# Patient Record
Sex: Male | Born: 1965 | Race: White | Hispanic: No | Marital: Single | Smoking: Never smoker
Health system: Southern US, Community
[De-identification: ages and names within clinical notes are randomized; demographics above are authoritative.]

## PROBLEM LIST (undated history)

## (undated) DIAGNOSIS — J309 Allergic rhinitis, unspecified: Secondary | ICD-10-CM

## (undated) DIAGNOSIS — E119 Type 2 diabetes mellitus without complications: Secondary | ICD-10-CM

## (undated) DIAGNOSIS — I10 Essential (primary) hypertension: Secondary | ICD-10-CM

## (undated) DIAGNOSIS — E663 Overweight: Secondary | ICD-10-CM

## (undated) DIAGNOSIS — E785 Hyperlipidemia, unspecified: Secondary | ICD-10-CM

## (undated) DIAGNOSIS — M5137 Other intervertebral disc degeneration, lumbosacral region: Secondary | ICD-10-CM

## (undated) DIAGNOSIS — R209 Unspecified disturbances of skin sensation: Secondary | ICD-10-CM

## (undated) DIAGNOSIS — G0490 Encephalitis and encephalomyelitis, unspecified: Secondary | ICD-10-CM

## (undated) DIAGNOSIS — G0491 Myelitis, unspecified: Secondary | ICD-10-CM

## (undated) DIAGNOSIS — N410 Acute prostatitis: Secondary | ICD-10-CM

## (undated) DIAGNOSIS — J111 Influenza due to unidentified influenza virus with other respiratory manifestations: Secondary | ICD-10-CM

## (undated) DIAGNOSIS — H539 Unspecified visual disturbance: Secondary | ICD-10-CM

## (undated) DIAGNOSIS — H4710 Unspecified papilledema: Secondary | ICD-10-CM

## (undated) HISTORY — PX: DENTAL SURGERY: SHX609

## (undated) HISTORY — DX: Hyperlipidemia, unspecified: E78.5

## (undated) HISTORY — DX: Overweight: E66.3

## (undated) HISTORY — DX: Unspecified disturbances of skin sensation: R20.9

## (undated) HISTORY — DX: Essential (primary) hypertension: I10

## (undated) HISTORY — DX: Myelitis, unspecified: G04.91

## (undated) HISTORY — DX: Acute prostatitis: N41.0

## (undated) HISTORY — DX: Type 2 diabetes mellitus without complications: E11.9

## (undated) HISTORY — DX: Encephalitis and encephalomyelitis, unspecified: G04.90

## (undated) HISTORY — DX: Other intervertebral disc degeneration, lumbosacral region: M51.37

## (undated) HISTORY — PX: POLYPECTOMY: SHX149

## (undated) HISTORY — PX: COLONOSCOPY: SHX174

## (undated) HISTORY — DX: Allergic rhinitis, unspecified: J30.9

---

## 2001-09-10 ENCOUNTER — Encounter: Admission: RE | Admit: 2001-09-10 | Discharge: 2001-12-09 | Payer: Self-pay | Admitting: Family Medicine

## 2006-07-07 ENCOUNTER — Ambulatory Visit: Payer: Self-pay | Admitting: Family Medicine

## 2006-07-17 ENCOUNTER — Ambulatory Visit: Payer: Self-pay | Admitting: Family Medicine

## 2006-10-17 ENCOUNTER — Ambulatory Visit: Payer: Self-pay | Admitting: Family Medicine

## 2006-10-17 LAB — CONVERTED CEMR LAB
ALT: 50 units/L — ABNORMAL HIGH (ref 0–40)
Chol/HDL Ratio, serum: 6.2
Hgb A1c MFr Bld: 9.7 % — ABNORMAL HIGH (ref 4.6–6.0)
LDL Cholesterol: 109 mg/dL — ABNORMAL HIGH (ref 0–99)
VLDL: 40 mg/dL (ref 0–40)

## 2006-10-24 ENCOUNTER — Ambulatory Visit: Payer: Self-pay | Admitting: Family Medicine

## 2006-12-13 ENCOUNTER — Ambulatory Visit: Payer: Self-pay | Admitting: Family Medicine

## 2006-12-25 ENCOUNTER — Ambulatory Visit: Payer: Self-pay | Admitting: Family Medicine

## 2006-12-25 LAB — CONVERTED CEMR LAB
ALT: 38 units/L (ref 0–40)
AST: 32 units/L (ref 0–37)
HDL: 29 mg/dL — ABNORMAL LOW (ref >39.0)
Hgb A1c MFr Bld: 6.6 % — ABNORMAL HIGH (ref 4.6–6.0)
LDL Cholesterol: 89 mg/dL (ref 0–99)
VLDL: 21 mg/dL (ref 0–40)

## 2007-01-15 ENCOUNTER — Ambulatory Visit: Payer: Self-pay | Admitting: Family Medicine

## 2007-01-24 ENCOUNTER — Ambulatory Visit: Payer: Self-pay | Admitting: Family Medicine

## 2007-01-24 LAB — CONVERTED CEMR LAB
ALT: 45 units/L — ABNORMAL HIGH (ref 0–40)
AST: 35 units/L (ref 0–37)
Cholesterol: 138 mg/dL (ref 0–200)
Hgb A1c MFr Bld: 6.3 % — ABNORMAL HIGH (ref 4.6–6.0)

## 2007-02-14 ENCOUNTER — Ambulatory Visit: Payer: Self-pay | Admitting: Family Medicine

## 2007-02-14 LAB — CONVERTED CEMR LAB
Sex Hormone Binding: 40 nmol/L (ref 13–71)
Testosterone Free: 118.9 pg/mL (ref 47.0–244.0)
Testosterone: 615.76 ng/dL (ref 350–890)

## 2007-02-26 DIAGNOSIS — E119 Type 2 diabetes mellitus without complications: Secondary | ICD-10-CM

## 2007-02-26 DIAGNOSIS — E11319 Type 2 diabetes mellitus with unspecified diabetic retinopathy without macular edema: Secondary | ICD-10-CM | POA: Insufficient documentation

## 2007-02-26 DIAGNOSIS — E785 Hyperlipidemia, unspecified: Secondary | ICD-10-CM

## 2007-02-26 DIAGNOSIS — I10 Essential (primary) hypertension: Secondary | ICD-10-CM | POA: Insufficient documentation

## 2007-02-26 DIAGNOSIS — E663 Overweight: Secondary | ICD-10-CM

## 2007-02-26 HISTORY — DX: Essential (primary) hypertension: I10

## 2007-02-26 HISTORY — DX: Overweight: E66.3

## 2007-02-26 HISTORY — DX: Type 2 diabetes mellitus without complications: E11.9

## 2007-02-26 HISTORY — DX: Hyperlipidemia, unspecified: E78.5

## 2007-05-17 ENCOUNTER — Ambulatory Visit: Payer: Self-pay | Admitting: Family Medicine

## 2007-05-17 LAB — CONVERTED CEMR LAB
ALT: 33 units/L (ref 0–53)
BUN: 14 mg/dL (ref 6–23)
Chloride: 105 meq/L (ref 96–112)
Creatinine, Ser: 0.8 mg/dL (ref 0.4–1.5)
Creatinine,U: 367.7 mg/dL
GFR calc non Af Amer: 114 mL/min
Hgb A1c MFr Bld: 5.8 % (ref 4.6–6.0)
LDL Cholesterol: 95 mg/dL (ref 0–99)
Microalb, Ur: 2.2 mg/dL — ABNORMAL HIGH (ref 0.0–1.9)
Potassium: 4.1 meq/L (ref 3.5–5.1)
Sodium: 141 meq/L (ref 135–145)
VLDL: 33 mg/dL (ref 0–40)

## 2007-05-18 ENCOUNTER — Encounter (INDEPENDENT_AMBULATORY_CARE_PROVIDER_SITE_OTHER): Payer: Self-pay | Admitting: *Deleted

## 2007-05-24 ENCOUNTER — Ambulatory Visit: Payer: Self-pay | Admitting: Family Medicine

## 2007-11-28 ENCOUNTER — Encounter (INDEPENDENT_AMBULATORY_CARE_PROVIDER_SITE_OTHER): Payer: Self-pay | Admitting: Family Medicine

## 2007-11-30 ENCOUNTER — Ambulatory Visit: Payer: Self-pay | Admitting: Family Medicine

## 2007-12-04 LAB — CONVERTED CEMR LAB
BUN: 11 mg/dL (ref 6–23)
CO2: 32 meq/L (ref 19–32)
Calcium: 9.1 mg/dL (ref 8.4–10.5)
Creatinine,U: 202.1 mg/dL
GFR calc Af Amer: 120 mL/min
HDL: 26.7 mg/dL — ABNORMAL LOW (ref >39.0)
Microalb Creat Ratio: 13.9 mg/g (ref 0.0–30.0)
Microalb, Ur: 2.8 mg/dL — ABNORMAL HIGH (ref 0.0–1.9)
Potassium: 4 meq/L (ref 3.5–5.1)
Triglycerides: 186 mg/dL — ABNORMAL HIGH (ref 0–149)
VLDL: 37 mg/dL (ref 0–40)

## 2007-12-10 ENCOUNTER — Ambulatory Visit: Payer: Self-pay | Admitting: Family Medicine

## 2007-12-11 ENCOUNTER — Encounter (INDEPENDENT_AMBULATORY_CARE_PROVIDER_SITE_OTHER): Payer: Self-pay | Admitting: *Deleted

## 2007-12-11 LAB — CONVERTED CEMR LAB: AST: 41 units/L — ABNORMAL HIGH (ref 0–37)

## 2009-02-05 ENCOUNTER — Ambulatory Visit: Payer: Self-pay | Admitting: Family Medicine

## 2009-02-05 LAB — CONVERTED CEMR LAB
AST: 37 units/L (ref 0–37)
BUN: 15 mg/dL (ref 6–23)
Basophils Absolute: 0 10*3/uL (ref 0.0–0.1)
Bilirubin Urine: NEGATIVE
Bilirubin, Direct: 0.1 mg/dL (ref 0.0–0.3)
Blood in Urine, dipstick: NEGATIVE
Calcium: 9.2 mg/dL (ref 8.4–10.5)
Cholesterol: 219 mg/dL — ABNORMAL HIGH (ref 0–200)
Creatinine, Ser: 0.8 mg/dL (ref 0.4–1.5)
Creatinine,U: 202.3 mg/dL
GFR calc non Af Amer: 113.03 mL/min (ref >60)
Glucose, Bld: 136 mg/dL — ABNORMAL HIGH (ref 70–99)
Glucose, Urine, Semiquant: NEGATIVE
HCT: 43.5 % (ref 39.0–52.0)
HDL: 26.4 mg/dL — ABNORMAL LOW (ref >39.00)
Hgb A1c MFr Bld: 7.6 % — ABNORMAL HIGH (ref 4.6–6.5)
Lymphs Abs: 2.1 10*3/uL (ref 0.7–4.0)
Microalb Creat Ratio: 6.9 mg/g (ref 0.0–30.0)
Monocytes Absolute: 0.6 10*3/uL (ref 0.1–1.0)
Monocytes Relative: 7.6 % (ref 3.0–12.0)
Neutrophils Relative %: 62.7 % (ref 43.0–77.0)
Platelets: 176 10*3/uL (ref 150.0–400.0)
Potassium: 3.7 meq/L (ref 3.5–5.1)
RDW: 12.5 % (ref 11.5–14.6)
Specific Gravity, Urine: 1.025
TSH: 2.84 microintl units/mL (ref 0.35–5.50)
Total Bilirubin: 1 mg/dL (ref 0.3–1.2)
Triglycerides: 219 mg/dL — ABNORMAL HIGH (ref 0.0–149.0)
VLDL: 43.8 mg/dL — ABNORMAL HIGH (ref 0.0–40.0)
pH: 6

## 2009-03-12 ENCOUNTER — Ambulatory Visit: Payer: Self-pay | Admitting: Family Medicine

## 2009-03-12 DIAGNOSIS — J309 Allergic rhinitis, unspecified: Secondary | ICD-10-CM

## 2009-03-12 DIAGNOSIS — R040 Epistaxis: Secondary | ICD-10-CM | POA: Insufficient documentation

## 2009-03-12 HISTORY — DX: Allergic rhinitis, unspecified: J30.9

## 2009-03-30 ENCOUNTER — Ambulatory Visit: Payer: Self-pay | Admitting: Family Medicine

## 2009-04-15 ENCOUNTER — Ambulatory Visit: Payer: Self-pay | Admitting: Family Medicine

## 2009-04-15 DIAGNOSIS — K5900 Constipation, unspecified: Secondary | ICD-10-CM | POA: Insufficient documentation

## 2009-04-17 ENCOUNTER — Ambulatory Visit: Payer: Self-pay | Admitting: Family Medicine

## 2009-04-17 ENCOUNTER — Telehealth: Payer: Self-pay | Admitting: *Deleted

## 2009-04-17 DIAGNOSIS — R209 Unspecified disturbances of skin sensation: Secondary | ICD-10-CM

## 2009-04-17 DIAGNOSIS — N410 Acute prostatitis: Secondary | ICD-10-CM | POA: Insufficient documentation

## 2009-04-17 HISTORY — DX: Acute prostatitis: N41.0

## 2009-04-17 HISTORY — DX: Unspecified disturbances of skin sensation: R20.9

## 2009-04-19 ENCOUNTER — Emergency Department (HOSPITAL_COMMUNITY): Admission: EM | Admit: 2009-04-19 | Discharge: 2009-04-19 | Payer: Self-pay | Admitting: Emergency Medicine

## 2009-04-20 ENCOUNTER — Ambulatory Visit: Payer: Self-pay | Admitting: Internal Medicine

## 2009-04-20 ENCOUNTER — Inpatient Hospital Stay (HOSPITAL_COMMUNITY): Admission: EM | Admit: 2009-04-20 | Discharge: 2009-05-01 | Payer: Self-pay | Admitting: Emergency Medicine

## 2009-04-27 ENCOUNTER — Ambulatory Visit: Payer: Self-pay | Admitting: Physical Medicine & Rehabilitation

## 2009-05-01 ENCOUNTER — Ambulatory Visit: Payer: Self-pay | Admitting: Family Medicine

## 2009-05-01 DIAGNOSIS — G0490 Encephalitis and encephalomyelitis, unspecified: Secondary | ICD-10-CM | POA: Insufficient documentation

## 2009-05-01 DIAGNOSIS — G0491 Myelitis, unspecified: Secondary | ICD-10-CM

## 2009-05-01 HISTORY — DX: Encephalitis and encephalomyelitis, unspecified: G04.90

## 2009-05-05 ENCOUNTER — Ambulatory Visit: Payer: Self-pay | Admitting: Family Medicine

## 2009-05-07 ENCOUNTER — Telehealth: Payer: Self-pay | Admitting: *Deleted

## 2009-05-07 ENCOUNTER — Ambulatory Visit: Payer: Self-pay | Admitting: Family Medicine

## 2009-05-13 ENCOUNTER — Ambulatory Visit: Payer: Self-pay | Admitting: Family Medicine

## 2009-05-19 ENCOUNTER — Telehealth: Payer: Self-pay | Admitting: *Deleted

## 2009-05-27 ENCOUNTER — Telehealth: Payer: Self-pay | Admitting: *Deleted

## 2009-06-02 ENCOUNTER — Ambulatory Visit: Payer: Self-pay | Admitting: Family Medicine

## 2009-06-02 DIAGNOSIS — R300 Dysuria: Secondary | ICD-10-CM | POA: Insufficient documentation

## 2009-06-02 LAB — CONVERTED CEMR LAB
Nitrite: NEGATIVE
Specific Gravity, Urine: 1.025
pH: 5

## 2009-06-16 ENCOUNTER — Ambulatory Visit: Payer: Self-pay | Admitting: Family Medicine

## 2009-06-16 DIAGNOSIS — M51379 Other intervertebral disc degeneration, lumbosacral region without mention of lumbar back pain or lower extremity pain: Secondary | ICD-10-CM

## 2009-06-16 DIAGNOSIS — M5137 Other intervertebral disc degeneration, lumbosacral region: Secondary | ICD-10-CM | POA: Insufficient documentation

## 2009-06-16 HISTORY — DX: Other intervertebral disc degeneration, lumbosacral region: M51.37

## 2009-06-16 HISTORY — DX: Other intervertebral disc degeneration, lumbosacral region without mention of lumbar back pain or lower extremity pain: M51.379

## 2009-06-17 ENCOUNTER — Encounter: Payer: Self-pay | Admitting: Family Medicine

## 2009-06-18 ENCOUNTER — Ambulatory Visit: Payer: Self-pay | Admitting: Family Medicine

## 2009-06-22 ENCOUNTER — Telehealth: Payer: Self-pay | Admitting: *Deleted

## 2009-07-03 ENCOUNTER — Encounter: Admission: RE | Admit: 2009-07-03 | Discharge: 2009-07-03 | Payer: Self-pay | Admitting: Neurology

## 2009-07-20 ENCOUNTER — Ambulatory Visit: Payer: Self-pay | Admitting: Family Medicine

## 2009-08-03 ENCOUNTER — Ambulatory Visit: Payer: Self-pay | Admitting: Family Medicine

## 2009-09-02 ENCOUNTER — Ambulatory Visit: Payer: Self-pay | Admitting: Family Medicine

## 2009-09-02 LAB — CONVERTED CEMR LAB
BUN: 14 mg/dL (ref 6–23)
CO2: 28 meq/L (ref 19–32)
Calcium: 8.9 mg/dL (ref 8.4–10.5)
Creatinine, Ser: 0.8 mg/dL (ref 0.4–1.5)
GFR calc non Af Amer: 112.71 mL/min (ref >60)
Glucose, Bld: 137 mg/dL — ABNORMAL HIGH (ref 70–99)
HDL: 28.7 mg/dL — ABNORMAL LOW (ref >39.00)
Triglycerides: 196 mg/dL — ABNORMAL HIGH (ref 0.0–149.0)

## 2009-11-30 ENCOUNTER — Ambulatory Visit: Payer: Self-pay | Admitting: Family Medicine

## 2009-11-30 DIAGNOSIS — J019 Acute sinusitis, unspecified: Secondary | ICD-10-CM | POA: Insufficient documentation

## 2010-01-22 ENCOUNTER — Ambulatory Visit: Payer: Self-pay | Admitting: Family Medicine

## 2010-01-22 LAB — CONVERTED CEMR LAB
AST: 46 units/L — ABNORMAL HIGH (ref 0–37)
Albumin: 3.8 g/dL (ref 3.5–5.2)
Alkaline Phosphatase: 54 units/L (ref 39–117)
Basophils Relative: 0 % (ref 0.0–3.0)
Bilirubin Urine: NEGATIVE
Bilirubin, Direct: 0.1 mg/dL (ref 0.0–0.3)
CO2: 31 meq/L (ref 19–32)
Calcium: 8.9 mg/dL (ref 8.4–10.5)
Creatinine,U: 149.2 mg/dL
Direct LDL: 147.8 mg/dL
Eosinophils Relative: 1.6 % (ref 0.0–5.0)
GFR calc non Af Amer: 112.5 mL/min (ref >60)
Glucose, Bld: 176 mg/dL — ABNORMAL HIGH (ref 70–99)
HDL: 34.7 mg/dL — ABNORMAL LOW (ref >39.00)
Hgb A1c MFr Bld: 6.9 % — ABNORMAL HIGH (ref 4.6–6.5)
Ketones, urine, test strip: NEGATIVE
Lymphocytes Relative: 29 % (ref 12.0–46.0)
Microalb Creat Ratio: 6.7 mg/g (ref 0.0–30.0)
Microalb, Ur: 1 mg/dL (ref 0.0–1.9)
Monocytes Relative: 11.5 % (ref 3.0–12.0)
Neutrophils Relative %: 57.9 % (ref 43.0–77.0)
Platelets: 153 10*3/uL (ref 150.0–400.0)
Potassium: 4.4 meq/L (ref 3.5–5.1)
Protein, U semiquant: NEGATIVE
RBC: 4.66 M/uL (ref 4.22–5.81)
Sodium: 138 meq/L (ref 135–145)
TSH: 2.99 microintl units/mL (ref 0.35–5.50)
Total Protein: 7.2 g/dL (ref 6.0–8.3)
Urobilinogen, UA: 0.2
VLDL: 40.6 mg/dL — ABNORMAL HIGH (ref 0.0–40.0)
WBC: 5.1 10*3/uL (ref 4.5–10.5)
pH: 5

## 2010-01-29 ENCOUNTER — Ambulatory Visit: Payer: Self-pay | Admitting: Family Medicine

## 2010-08-02 ENCOUNTER — Telehealth: Payer: Self-pay | Admitting: Family Medicine

## 2010-12-21 NOTE — Letter (Signed)
Summary: Work Dietitian at [employer]  [address]   [place], [place] [address]   Phone: [phone]  Fax: [phone]    Today's Date: August 03, 2009  Name of Patient: Tyler Aguirre  The above named patient had a medical visit today at:  am / pm.  Please take this into consideration when reviewing the time away from work/school.    Special Instructions:  [  ] None  [  ] To be off the remainder of today, returning to the normal work / school schedule tomorrow.  [  ] To be off until the next scheduled appointment on ______________________.  [  ] Other ________________________________________________________________ ________________________________________________________________________   Sincerely yours,   Kern Reap CMA (AAMA)

## 2010-12-21 NOTE — Assessment & Plan Note (Signed)
Summary: chest congestion//ccm   Vital Signs:  Patient profile:   45 year old male Weight:      288 pounds BMI:     37.11 Temp:     98.9 degrees F oral BP sitting:   140 / 98  (left arm) Cuff size:   regular  Vitals Entered By: Kern Reap CMA Duncan Dull) (November 30, 2009 8:52 AM)  Reason for Visit chest congestion  History of Present Illness: Tyler Aguirre is a 45 year old male, nonsmoker, who comes in today for a two week history of a cold now with facial pain and bloody green nasal drainage.  He states it started as a cold two weeks ago.  Over the last 3 to 4, days she's developed facial pain and bloody discharge from his nose.  He has no fever, earache, nausea, vomiting, or diarrhea.  He also has a cough, but he thinks is coming from his sinuses.  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  He is going to start with a personal trainer in the next couple weeks.  His blood sugar is under excellent control.  His fastings run around 100  Allergies: No Known Drug Allergies  Past History:  Past medical, surgical, family and social histories (including risk factors) reviewed for relevance to current acute and chronic problems.  Past Medical History: Reviewed history from 05/01/2009 and no changes required. Diabetes mellitus, type II Hyperlipidemia Hypertension transverse myelitis  6/10  Family History: Reviewed history from 02/05/2009 and no changes required. Father: heart disorder Mother: arthritis Siblings: brother - healthy  Social History: Reviewed history from 02/05/2009 and no changes required. Occupation: Environmental consultant. Single Never Smoked Alcohol use-yes Regular exercise-no  Review of Systems      See HPI  Physical Exam  General:  Well-developed,well-nourished,in no acute distress; alert,appropriate and cooperative throughout examination Head:  Normocephalic and atraumatic without obvious abnormalities. No apparent alopecia or balding. Eyes:  No corneal or  conjunctival inflammation noted. EOMI. Perrla. Funduscopic exam benign, without hemorrhages, exudates or papilledema. Vision grossly normal. Ears:  External ear exam shows no significant lesions or deformities.  Otoscopic examination reveals clear canals, tympanic membranes are intact bilaterally without bulging, retraction, inflammation or discharge. Hearing is grossly normal bilaterally. Nose:  External nasal examination shows no deformity or inflammation. Nasal mucosa are pink and moist without lesions or exudates. Mouth:  Oral mucosa and oropharynx without lesions or exudates.  Teeth in good repair. Neck:  No deformities, masses, or tenderness noted. Chest Wall:  No deformities, masses, tenderness or gynecomastia noted. Lungs:  Normal respiratory effort, chest expands symmetrically. Lungs are clear to auscultation, no crackles or wheezes.  Diabetes Management Exam:    Foot Exam (with socks and/or shoes not present):       Sensory-Pinprick/Light touch:          Left medial foot (L-4): normal          Left dorsal foot (L-5): normal          Left lateral foot (S-1): normal          Right medial foot (L-4): normal          Right dorsal foot (L-5): normal          Right lateral foot (S-1): normal       Sensory-Monofilament:          Left foot: normal          Right foot: normal       Inspection:  Left foot: normal          Right foot: normal       Nails:          Left foot: normal          Right foot: normal    Eye Exam:       Eye Exam done elsewhere          Date: 08/04/2008          Results: normal          Done by: opth   Problems:  Medical Problems Added: 1)  Dx of Sinusitis- Acute-nos  (ICD-461.9)  Impression & Recommendations:  Problem # 1:  SINUSITIS- ACUTE-NOS (ICD-461.9) Assessment New  His updated medication list for this problem includes:    Amoxicillin 500 Mg Caps (Amoxicillin) .Marland Kitchen... Take 1 tablet by mouth three times a day  Orders: Prescription  Created Electronically [id number])  Complete Medication List: 1)  Glipizide 10 Mg Tb24 (Glipizide) .... Take 1 tablet by mouth two times a day 2)  [employer] [employer] Super Thin 30g Misc (Lancets) .... Uad 3)  Janumet 50-1000 Mg Tabs (Sitagliptin-metformin hcl) .... Take 1 tablet by mouth twice a day 4)  [facility] Blood Glucose Test Strp (Glucose blood) .... Check bid 5)  Lisinopril 40 Mg Tabs (Lisinopril) .... Take half  tablet by mouth every morning 6)  Zocor 20 Mg Tabs (Simvastatin) .Marland Kitchen.. 1 tab @ bedtime 7)  Ambien 10 Mg Tabs (Zolpidem tartrate) .Marland Kitchen.. 1 tab @ bedtime as needed 8)  Amoxicillin 500 Mg Caps (Amoxicillin) .... Take 1 tablet by mouth three times a day  Patient Instructions: 1)  drinks 3 ounces of water daily, run a vaporizer in your bedroom at night, began amoxicillin, 500 mg 3 times a day for 10 days.  Irrigate your nose, nightly with warm saline in a netti pot  2)  Please schedule a follow-up appointment as needed. 3)  BMP prior to visit, ICD-9:      250.00       3/11 4)  Hepatic Panel prior to visit, ICD-9: 5)  Lipid Panel prior to visit, ICD-9: 6)  TSH prior to visit, ICD-9: 7)  CBC w/ Diff prior to visit, ICD-9: 8)  Urine-dip prior to visit, ICD-9: 9)  HbgA1C prior to visit, ICD-9: 10)  Urine Microalbumin prior to visit, ICD-9: Prescriptions: AMOXICILLIN 500 MG CAPS (AMOXICILLIN) Take 1 tablet by mouth three times a day  #30 x 1   Entered and Authorized by:   Roderick Pee MD   Signed by:   Roderick Pee MD on 11/30/2009   Method used:   Electronically to        [facility] (retail)       [address]       [place], [place]  [address]       Ph: [phone] or [phone]       Fax: [phone]   RxID:   [id number]

## 2010-12-21 NOTE — Progress Notes (Signed)
Summary: lisinopril refill  Phone Note Refill Request Message from:  Fax from Pharmacy on August 02, 2010 12:36 PM  Refills Requested: Medication #1:  LISINOPRIL 40 MG TABS Take half  tablet by mouth every morning Initial call taken by: Kern Reap CMA Duncan Dull),  August 02, 2010 12:36 PM    Prescriptions: LISINOPRIL 40 MG TABS (LISINOPRIL) Take half  tablet by mouth every morning  #100 x 1   Entered by:   Kern Reap CMA (AAMA)   Authorized by:   Roderick Pee MD   Signed by:   Kern Reap CMA (AAMA) on 08/02/2010   Method used:   Electronically to        [facility] (retail)       [address]       [place], [place]  [address]       Ph: [phone] or [phone]       Fax: [phone]   RxID:   [id number]

## 2010-12-21 NOTE — Assessment & Plan Note (Signed)
Summary: 2 mo rov/mm   Vital Signs:  Patient profile:   45 year old male Weight:      283 pounds Temp:     98.7 degrees F oral BP sitting:   130 / 90  (left arm) Cuff size:   large  Vitals Entered By: Kern Reap CMA Duncan Dull) (January 29, 2010 10:27 AM)  Reason for Visit follow up office visit  History of Present Illness: Tyler Aguirre is a 45 year old male, nonsmoker, who comes in today for physical evaluation because of underlying health care issues.  About a year gadolinium.  He came in with back pain.  We initially felt he had lumbar disk disease.  However, it turned out to be transverse myelitis.  He was hospitalized put on IV steroids had to have a Foley catheter.  He stent well and he feels, like he's probably about 90 to 95% back to normal.  He still has some fatigue with exercise, but otherwise feels okay.  He has underlying diabetes, for which he takesjanumet 50 -- 1000 b.i.d., glipizide 10 mg b.i.d., blood sugar 176, with an A1c of 6.9.  His blood pressure is 130/90, however, he's not taken his lisinopril recently.  He takes Zocor 20 mg nightly for hyperlipidemia.  Lipids are not at goal.  However, it, appears he's not been taking that medication either.  He gets routine eye care note.  Dental care.  Referred to Dr. Alvester Morin.  No family history of colon cancer.  Therefore, screening colonoscopy at age 62.Will also refer to Dr. Ezzard Standing.  ENT because of recurrent nosebleeds  Allergies: No Known Drug Allergies  Past History:  Past medical, surgical, family and social histories (including risk factors) reviewed, and no changes noted (except as noted below).  Past Medical History: Reviewed history from 05/01/2009 and no changes required. Diabetes mellitus, type II Hyperlipidemia Hypertension transverse myelitis  6/10  Family History: Reviewed history from 02/05/2009 and no changes required. Father: heart disorder Mother: arthritis Siblings: brother - healthy  Social  History: Reviewed history from 02/05/2009 and no changes required. Occupation: Environmental consultant. Single Never Smoked Alcohol use-yes Regular exercise-no  Review of Systems      See HPI  Physical Exam  General:  Well-developed,well-nourished,in no acute distress; alert,appropriate and cooperative throughout examination Head:  Normocephalic and atraumatic without obvious abnormalities. No apparent alopecia or balding. Eyes:  No corneal or conjunctival inflammation noted. EOMI. Perrla. Funduscopic exam benign, without hemorrhages, exudates or papilledema. Vision grossly normal. Ears:  External ear exam shows no significant lesions or deformities.  Otoscopic examination reveals clear canals, tympanic membranes are intact bilaterally without bulging, retraction, inflammation or discharge. Hearing is grossly normal bilaterally. Nose:  External nasal examination shows no deformity or inflammation. Nasal mucosa are pink and moist without lesions or exudates. Mouth:  Oral mucosa and oropharynx without lesions or exudates.  Teeth in good repair. Neck:  No deformities, masses, or tenderness noted. Chest Wall:  No deformities, masses, tenderness or gynecomastia noted. Breasts:  No masses or gynecomastia noted Lungs:  Normal respiratory effort, chest expands symmetrically. Lungs are clear to auscultation, no crackles or wheezes. Heart:  Normal rate and regular rhythm. S1 and S2 normal without gallop, murmur, click, rub or other extra sounds. Abdomen:  Bowel sounds positive,abdomen soft and non-tender without masses, organomegaly or hernias noted. Rectal:  No external abnormalities noted. Normal sphincter tone. No rectal masses or tenderness. Genitalia:  Testes bilaterally descended without nodularity, tenderness or masses. No scrotal masses or lesions. No penis lesions  or urethral discharge. Msk:  No deformity or scoliosis noted of thoracic or lumbar spine.   Pulses:  R and L  carotid,radial,femoral,dorsalis pedis and posterior tibial pulses are full and equal bilaterally Extremities:  No clubbing, cyanosis, edema, or deformity noted with normal full range of motion of all joints.   Neurologic:  No cranial nerve deficits noted. Station and gait are normal. Plantar reflexes are down-going bilaterally. DTRs are symmetrical throughout. Sensory, motor and coordinative functions appear intact. Skin:  Intact without suspicious lesions or rashes Cervical Nodes:  No lymphadenopathy noted Axillary Nodes:  No palpable lymphadenopathy Inguinal Nodes:  No significant adenopathy Psych:  Cognition and judgment appear intact. Alert and cooperative with normal attention span and concentration. No apparent delusions, illusions, hallucinations  Diabetes Management Exam:    Foot Exam (with socks and/or shoes not present):       Sensory-Pinprick/Light touch:          Left medial foot (L-4): normal          Left dorsal foot (L-5): normal          Left lateral foot (S-1): normal          Right medial foot (L-4): normal          Right dorsal foot (L-5): normal          Right lateral foot (S-1): normal       Sensory-Monofilament:          Left foot: normal          Right foot: normal       Inspection:          Left foot: normal          Right foot: normal       Nails:          Left foot: normal          Right foot: normal    Eye Exam:       Eye Exam done elsewhere          Date: 01/26/2009          Results: normal          Done by: opth   Impression & Recommendations:  Problem # 1:  TRANSVERSE MYELITIS (ICD-323.9) Assessment Improved  Problem # 2:  NOSEBLEED (ICD-784.7) Assessment: Unchanged  Orders: Prescription Created Electronically [phone])  Problem # 3:  HYPERTENSION (ICD-401.9) Assessment: Deteriorated  His updated medication list for this problem includes:    Lisinopril 40 Mg Tabs (Lisinopril) .Marland Kitchen... Take half  tablet by mouth every  morning  Orders: Prescription Created Electronically [phone]) EKG w/ Interpretation (93[address])  Problem # 4:  HYPERLIPIDEMIA (ICD-272.4) Assessment: Deteriorated  His updated medication list for this problem includes:    Zocor 20 Mg Tabs (Simvastatin) .Marland Kitchen... 1 tab @ bedtime  Orders: Prescription Created Electronically [phone]) EKG w/ Interpretation (93000)  Problem # 5:  DIABETES MELLITUS, TYPE II (ICD-250.00) Assessment: Unchanged  His updated medication list for this problem includes:    Glipizide 10 Mg Tb24 (Glipizide) .Marland Kitchen... Take 1 tablet by mouth two times a day    Janumet 50-1000 Mg Tabs (Sitagliptin-metformin hcl) .Marland Kitchen... Take 1 tablet by mouth twice a day    Lisinopril 40 Mg Tabs (Lisinopril) .Marland Kitchen... Take half  tablet by mouth every morning  Orders: Prescription Created Electronically [phone])  Complete Medication List: 1)  Glipizide 10 Mg Tb24 (Glipizide) .... Take 1 tablet by mouth two times a day 2)  [employer]  Super Thin 30g Misc (Lancets) .... Uad 3)  Janumet 50-1000 Mg Tabs (Sitagliptin-metformin hcl) .... Take 1 tablet by mouth twice a day 4)  [facility] Blood Glucose Test Strp (Glucose blood) .... Check bid 5)  Lisinopril 40 Mg Tabs (Lisinopril) .... Take half  tablet by mouth every morning 6)  Zocor 20 Mg Tabs (Simvastatin) .Marland Kitchen.. 1 tab @ bedtime 7)  Ambien 10 Mg Tabs (Zolpidem tartrate) .Marland Kitchen.. 1 tab @ bedtime as needed  Patient Instructions: 1)  continue your exercise program.  Check a fasting blood sugar Monday, Wednesday, Friday.  Return in 3 months for follow-up 2)  BMP prior to visit, ICD-9:..................250.00 3)  HbgA1C prior to visit, ICD-9: 4)  restart the lisinopril, and the Zocor.  Check a blood pressure, Monday, Wednesday, Friday, along with a blood sugar.  After 4 weeks, your blood pressure should be 130/80 or less.  If not return to see me for follow-up in one month. 5)  Call Dr. Alvester Morin for dental evaluation, 6)  Take an Aspirin every day. Prescriptions: ZOCOR  20 MG TABS (SIMVASTATIN) 1 tab @ bedtime  #100 x 3   Entered and Authorized by:   Roderick Pee MD   Signed by:   Roderick Pee MD on 01/29/2010   Method used:   Electronically to        [facility] (retail)       [address]       [place], [place]  [address]       Ph: [phone] or [phone]       Fax: [phone]   RxID:   [id number] LISINOPRIL 40 MG TABS (LISINOPRIL) Take half  tablet by mouth every morning  #100 x 3   Entered and Authorized by:   Roderick Pee MD   Signed by:   Roderick Pee MD on 01/29/2010   Method used:   Electronically to        [facility] (retail)       [address]       [place], [place]  [address]       Ph: [phone] or [phone]       Fax: [phone]   RxID:   [id number] [facility] BLOOD GLUCOSE TEST  STRP (GLUCOSE BLOOD) Check BID  #100 x 3   Entered and Authorized by:   Roderick Pee MD   Signed by:   Roderick Pee MD on 01/29/2010   Method used:   Electronically to        [facility] (retail)       [address]       [place], [place]  [address]       Ph: [phone] or [phone]       Fax: [phone]   RxID:   [id number] JANUMET 50-1000 MG TABS (SITAGLIPTIN-METFORMIN HCL) Take 1 tablet by mouth twice a day  #200 x 3   Entered and Authorized by:   Roderick Pee MD   Signed by:   Roderick Pee MD on 01/29/2010   Method used:   Electronically to        [facility] (retail)       [address]       [facility]  [place], [place]  [address]       Ph: [phone] or [phone]       Fax: [phone]   RxID:   [id number] UYQIHK LANCET SUPER THIN 30G  MISC (LANCETS) UAD  #100 x 3   Entered and Authorized by:   Roderick Pee MD   Signed by:   Roderick Pee MD on 01/29/2010   Method used:    Electronically to        [facility] (retail)       [address]       [place], [place]  [address]       Ph: [phone] or [phone]       Fax: [phone]   RxID:   [id number] GLIPIZIDE 10 MG TB24 (GLIPIZIDE) Take 1 tablet by mouth two times a day  #200 x 3   Entered and Authorized by:   Roderick Pee MD   Signed by:   Roderick Pee MD on 01/29/2010   Method used:   Electronically to        [facility] (retail)       [address]       [place], [place]  [address]       Ph: [phone] or [phone]       Fax: [phone]   RxID:   [id number]

## 2011-02-28 LAB — GLUCOSE, CAPILLARY
Glucose-Capillary: 105 mg/dL — ABNORMAL HIGH (ref 70–99)
Glucose-Capillary: 120 mg/dL — ABNORMAL HIGH (ref 70–99)
Glucose-Capillary: 130 mg/dL — ABNORMAL HIGH (ref 70–99)
Glucose-Capillary: 139 mg/dL — ABNORMAL HIGH (ref 70–99)
Glucose-Capillary: 147 mg/dL — ABNORMAL HIGH (ref 70–99)
Glucose-Capillary: 150 mg/dL — ABNORMAL HIGH (ref 70–99)
Glucose-Capillary: 165 mg/dL — ABNORMAL HIGH (ref 70–99)
Glucose-Capillary: 167 mg/dL — ABNORMAL HIGH (ref 70–99)
Glucose-Capillary: 171 mg/dL — ABNORMAL HIGH (ref 70–99)
Glucose-Capillary: 174 mg/dL — ABNORMAL HIGH (ref 70–99)
Glucose-Capillary: 175 mg/dL — ABNORMAL HIGH (ref 70–99)
Glucose-Capillary: 179 mg/dL — ABNORMAL HIGH (ref 70–99)
Glucose-Capillary: 179 mg/dL — ABNORMAL HIGH (ref 70–99)
Glucose-Capillary: 196 mg/dL — ABNORMAL HIGH (ref 70–99)
Glucose-Capillary: 199 mg/dL — ABNORMAL HIGH (ref 70–99)
Glucose-Capillary: 217 mg/dL — ABNORMAL HIGH (ref 70–99)
Glucose-Capillary: 221 mg/dL — ABNORMAL HIGH (ref 70–99)
Glucose-Capillary: 231 mg/dL — ABNORMAL HIGH (ref 70–99)
Glucose-Capillary: 261 mg/dL — ABNORMAL HIGH (ref 70–99)
Glucose-Capillary: 305 mg/dL — ABNORMAL HIGH (ref 70–99)
Glucose-Capillary: 310 mg/dL — ABNORMAL HIGH (ref 70–99)
Glucose-Capillary: 310 mg/dL — ABNORMAL HIGH (ref 70–99)
Glucose-Capillary: 337 mg/dL — ABNORMAL HIGH (ref 70–99)
Glucose-Capillary: 353 mg/dL — ABNORMAL HIGH (ref 70–99)
Glucose-Capillary: 370 mg/dL — ABNORMAL HIGH (ref 70–99)
Glucose-Capillary: 412 mg/dL — ABNORMAL HIGH (ref 70–99)

## 2011-02-28 LAB — OLIGOCLONAL BANDS, CSF + SERM
Albumin Index: 7.4 ratio (ref 0.0–9.0)
CSF Oligoclonal Bands: POSITIVE
IgG, CSF: 4.3 mg/dL (ref 0.0–6.0)
IgG, Serum: 1200 mg/dL (ref 768–1632)
MS CNS IgG Synthesis Rate: <0 mg/d (ref <=8.0)

## 2011-02-28 LAB — GLUCOSE, RANDOM: Glucose, Bld: 401 mg/dL — ABNORMAL HIGH (ref 70–99)

## 2011-02-28 LAB — URINE DRUGS OF ABUSE SCREEN W ALC, ROUTINE (REF LAB)
Cocaine Metabolites: NEGATIVE
Creatinine,U: 103.6 mg/dL
Methadone: NEGATIVE
Phencyclidine (PCP): NEGATIVE

## 2011-02-28 LAB — CBC
HCT: 37.1 % — ABNORMAL LOW (ref 39.0–52.0)
MCV: 91.1 fL (ref 78.0–100.0)
Platelets: 153 10*3/uL (ref 150–400)
RDW: 13.3 % (ref 11.5–15.5)

## 2011-02-28 LAB — HSV PCR
HSV 2 , PCR: NOT DETECTED
HSV, PCR: NOT DETECTED
HSV, PCR: NOT DETECTED

## 2011-02-28 LAB — CSF CELL COUNT WITH DIFFERENTIAL
Lymphs, CSF: 98 % — ABNORMAL HIGH (ref 40–80)
RBC Count, CSF: 4 /mm3 — ABNORMAL HIGH
Segmented Neutrophils-CSF: 0 % (ref 0–6)

## 2011-02-28 LAB — PROTEIN AND GLUCOSE, CSF
Glucose, CSF: 158 mg/dL — ABNORMAL HIGH (ref 43–76)
Total  Protein, CSF: 44 mg/dL (ref 15–45)

## 2011-02-28 LAB — LIPID PANEL
LDL Cholesterol: 112 mg/dL — ABNORMAL HIGH (ref 0–99)
Total CHOL/HDL Ratio: 7.6 RATIO
VLDL: 26 mg/dL (ref 0–40)

## 2011-02-28 LAB — COMPREHENSIVE METABOLIC PANEL
BUN: 16 mg/dL (ref 6–23)
Calcium: 8.4 mg/dL (ref 8.4–10.5)
Glucose, Bld: 126 mg/dL — ABNORMAL HIGH (ref 70–99)
Total Protein: 6.4 g/dL (ref 6.0–8.3)

## 2011-02-28 LAB — HEMOGLOBIN A1C
Hgb A1c MFr Bld: 6.2 % — ABNORMAL HIGH (ref 4.6–6.1)
Mean Plasma Glucose: 131 mg/dL

## 2011-02-28 LAB — URINE CULTURE: Culture: NO GROWTH

## 2011-02-28 LAB — VARICELLA-ZOSTER BY PCR: Varicella-Zoster, PCR: NOT DETECTED

## 2011-02-28 LAB — VITAMIN E
Alpha-Tocopherol: 7.5 mg/L (ref 2.4–20.0)
Gamma-Tocopherol (Vit E): 1.7 mg/L (ref <4.7)

## 2011-02-28 LAB — SJOGRENS SYNDROME-A EXTRACTABLE NUCLEAR ANTIBODY: SSA (Ro) (ENA) Antibody, IgG: <0.2 AI (ref <1.0)

## 2011-02-28 LAB — PROTIME-INR: INR: 1.1 (ref 0.00–1.49)

## 2011-02-28 LAB — APTT: aPTT: 28 seconds (ref 24–37)

## 2011-03-01 LAB — DIFFERENTIAL
Basophils Relative: 1 % (ref 0–1)
Eosinophils Absolute: 0.1 10*3/uL (ref 0.0–0.7)
Eosinophils Relative: 1 % (ref 0–5)
Monocytes Absolute: 1.1 10*3/uL — ABNORMAL HIGH (ref 0.1–1.0)
Monocytes Relative: 11 % (ref 3–12)

## 2011-03-01 LAB — URINALYSIS, ROUTINE W REFLEX MICROSCOPIC
Bilirubin Urine: NEGATIVE
Glucose, UA: 100 mg/dL — AB
Hgb urine dipstick: NEGATIVE
Ketones, ur: NEGATIVE mg/dL
Ketones, ur: NEGATIVE mg/dL
Nitrite: NEGATIVE
Nitrite: NEGATIVE
Protein, ur: NEGATIVE mg/dL
Specific Gravity, Urine: 1.017 (ref 1.005–1.030)
Specific Gravity, Urine: 1.021 (ref 1.005–1.030)
Urobilinogen, UA: 0.2 mg/dL (ref 0.0–1.0)
pH: 5 (ref 5.0–8.0)
pH: 5.5 (ref 5.0–8.0)

## 2011-03-01 LAB — URINE MICROSCOPIC-ADD ON

## 2011-03-01 LAB — POCT I-STAT, CHEM 8
Chloride: 99 mEq/L (ref 96–112)
Glucose, Bld: 159 mg/dL — ABNORMAL HIGH (ref 70–99)
HCT: 47 % (ref 39.0–52.0)
Hemoglobin: 16 g/dL (ref 13.0–17.0)
Potassium: 3.9 mEq/L (ref 3.5–5.1)

## 2011-03-01 LAB — CBC
Hemoglobin: 15.6 g/dL (ref 13.0–17.0)
Platelets: 197 10*3/uL (ref 150–400)
RDW: 13.6 % (ref 11.5–15.5)
WBC: 9.8 10*3/uL (ref 4.0–10.5)

## 2011-03-01 LAB — COMPREHENSIVE METABOLIC PANEL
ALT: 50 U/L (ref 0–53)
Albumin: 3.9 g/dL (ref 3.5–5.2)
Alkaline Phosphatase: 52 U/L (ref 39–117)
Chloride: 102 mEq/L (ref 96–112)
Glucose, Bld: 283 mg/dL — ABNORMAL HIGH (ref 70–99)
Potassium: 4.1 mEq/L (ref 3.5–5.1)
Sodium: 136 mEq/L (ref 135–145)
Total Bilirubin: 1 mg/dL (ref 0.3–1.2)
Total Protein: 7.8 g/dL (ref 6.0–8.3)

## 2011-03-01 LAB — CK TOTAL AND CKMB (NOT AT ARMC): CK, MB: 4.7 ng/mL — ABNORMAL HIGH (ref 0.3–4.0)

## 2011-03-01 LAB — GLUCOSE, CAPILLARY

## 2011-03-01 LAB — RAPID URINE DRUG SCREEN, HOSP PERFORMED
Amphetamines: NOT DETECTED
Barbiturates: NOT DETECTED
Benzodiazepines: NOT DETECTED

## 2011-03-18 ENCOUNTER — Encounter: Payer: Self-pay | Admitting: Internal Medicine

## 2011-03-18 ENCOUNTER — Ambulatory Visit (INDEPENDENT_AMBULATORY_CARE_PROVIDER_SITE_OTHER): Payer: 59 | Admitting: Internal Medicine

## 2011-03-18 DIAGNOSIS — E119 Type 2 diabetes mellitus without complications: Secondary | ICD-10-CM

## 2011-03-18 DIAGNOSIS — L853 Xerosis cutis: Secondary | ICD-10-CM

## 2011-03-18 DIAGNOSIS — L738 Other specified follicular disorders: Secondary | ICD-10-CM

## 2011-03-18 DIAGNOSIS — I10 Essential (primary) hypertension: Secondary | ICD-10-CM

## 2011-03-18 NOTE — Patient Instructions (Signed)
apply moisturizing cream to the soles of the feet 2 or 3 times daily Call or return to clinic prn if these symptoms worsen or fail to improve as anticipated.  Take your antibiotic as prescribed until ALL of it is gone, but stop if you develop a rash, swelling, or any side effects of the medication.  Contact our office as soon as possible if  there are side effects of the medication.   Please check your hemoglobin A1c every 3 months

## 2011-03-18 NOTE — Progress Notes (Signed)
  Subjective:    Patient ID: Tyler Aguirre, male    DOB: 1966/02/15, 45 y.o.   MRN: [id number]  HPI 45 year old patient who has type 2 diabetes hypertension and dyslipidemia.  He had a dental procedure done earlier in the week and has been on Augmentin. He noted a dry scaly rash involving the plantar aspect of his right foot and was concerned about a drug reaction. He states that he has a long history of dry skin issues involving both the hands and feet.    Review of Systems  Skin: Positive for rash.       Objective:   Physical Exam  Constitutional: He appears well-developed and well-nourished. No distress.       Overweight. Pressure 120/82. Random blood sugar 189  Skin: Skin is dry.       The sole of the right foot was dry and flaky. No open areas of ulceration. Pedal pulses were intact          Assessment & Plan:   diabetes. Hypertension. Xerosis-  localized and care discussed. He will liberally apply moisturizing lotions.

## 2011-04-05 NOTE — Consult Note (Signed)
NAME:  Tyler Aguirre, Tyler Aguirre NO.:  [id number]   MEDICAL RECORD NO.:  [id number]          PATIENT TYPE:  INP   LOCATION:  5511                         FACILITY:  [facility]   PHYSICIAN:  Casimiro Needle L. Reynolds, M.D.DATE OF BIRTH:  Oct 19, 1966   DATE OF CONSULTATION:  04/24/2009  DATE OF DISCHARGE:                                 CONSULTATION   REQUESTING PHYSICIAN:  Lonia Blood, MD   REASON FOR EVALUATION:  Myelopathy.   HISTORY OF PRESENT ILLNESS:  This is the initial inpatient consultation  evaluation of this 45 year old man with a past medical history, which  includes hypertension, diabetes, hyperlipidemia.  He was admitted on Apr 20, 2009, for decreased sensation in the legs, which had begun few days  prior to that.  The symptoms including urinary retention and  constipation.  He was admitted and neurosurgical consultation was  obtained for suspected cauda equina syndrome.  He was noted at that time  to have a T12 sensory level on exam.  He did have an MRI of his brain  and thoracic spine.  The brain study demonstrated a little bit of white  matter disease off the posterior horn of left lateral ventricle,  otherwise unremarkable.  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  There was some cord enhancement in this area  around T3 to about T7.  This was thought to likely represent myelitis,  and neurologic consultation is requested.  The patient says that these  symptoms have been stable for last few days.  He does have some  difficulty with walking that he can not feel his feet very well.  He  specifically denies having episodes of transient binocular visual loss  or hemisensory changes or hemiparesis.   PAST MEDICAL HISTORY:  Rule out hypertension, diabetes, high cholesterol  as above.  He is on  medications for all of these.   FAMILY/SOCIAL/REVIEW OF SYSTEMS:  Per admission H and P by Dr. Gilford Rile of  Apr 20, 2009, which is reviewed.   MEDICATIONS PRIOR TO ADMISSION:  1. Allegra p.r.n.  2. Glipizide.  3. Januvia.  4. Lisinopril.  5. Zocor.  6. Cipro for any presumed urinary tract infection.   MEDICATIONS AT THIS TIME:  Flomax, Januvia, lisinopril, Protonix,  ciprofloxacin, and sliding scale insulin.   PHYSICAL EXAMINATION:  VITAL SIGNS:  Temperature 97, blood pressure  114/64, pulse 66, respirations 16.  GENERAL:  This is a healthy-appearing man seated supine in the hospital  in no acute stress.  HEENT:  Cranium is normocephalic and atraumatic.  Oropharynx benign.  NECK:  Supple without carotid or subclavicular bruits.  HEART:  Regular rate and rhythm without murmurs.  NEUROLOGIC:  Mental status, he is awake and alert.  He is oriented to  time, place, and person.  Recent and remote memory are intact.  Attention span, concentration, and fund of knowledge are appropriate.  Speech is fluent,  but not dysarthric.  He is able to name objects,  repeat phrases without difficulty.  CRANIAL NERVES:  Pupils are equal and reactive.  Extraocular movements  are full without nystagmus.  Visual fields are full to confrontation.  Hearing is intact.  Conversational speech.  Face, tongue, and palate  move normally and symmetrically.  MOTOR:  Normal bulk and tone.  There is mild weakness of the hip  flexors, but otherwise normal strength throughout.  SENSATION:  Diminished pinprick, light touch, and vibration in the lower  extremities.  With diminished pinprick over the trunk to about T10  level.  Proprioception is intact.  COORDINATION:  Finger-to-nose is performed accurately.  Heel-to-chin is  performed well.  GAIT:  He rises slowly from chair.  His gait is narrow and a little bit  shuffled with straight leg and mildly unsteady.  Reflexes are 2+ and  symmetric and he has bilateral  upgoing toes, but no clonus.   LABORATORY DATA:  CBC and CMET for daily notes.  TSH and B12 are normal.  Hemoglobin A1c is 62.  Urine drug screen is negative.  MRI of the brain  and thoracic spine are as above.   IMPRESSION:  Myelitis, uncertain etiology.  This has appropriate  symptoms including lower extremity sensory changes, mild paraparesis,  gait disturbance, and sphincter dysfunction.  His brain MRI is not  strongly suggested of multiple sclerosis.   RECOMMENDATIONS:  We will check lab including RPR, AIDS levels, HIV, HDL  V1 and V2 and vitamin E level.  We will also check CSF for cell count,  glucose, protein, oligoclonal bands, and PCR for herpes simplex and  Varicella and zoster.  We will start IV steroids for 3 days, then oral  steroids in the next 2 weeks.  Continued therapy, and likely need rehab  on his way home.  We will follow with you.  Thank you for the  consultation.      Michael L. Thad Ranger, M.D.  Electronically Signed     MLR/MEDQ  D:  04/24/2009  T:  04/25/2009  Job:  [id number]

## 2011-04-05 NOTE — Discharge Summary (Signed)
NAME:  Tyler Aguirre, Tyler Aguirre NO.:  [id number]   MEDICAL RECORD NO.:  [id number]          PATIENT TYPE:  INP   LOCATION:  5511                         FACILITY:  [facility]   PHYSICIAN:  Gordy Savers, MDDATE OF BIRTH:  Jan 14, 1966   DATE OF ADMISSION:  04/20/2009  DATE OF DISCHARGE:  05/01/2009                               DISCHARGE SUMMARY   FINAL DIAGNOSES:  1. Transverse myelitis.  2. Acute urinary retention.  3. Uncontrolled type 2 diabetes.  4. Hypertension.  5. Dyslipidemia.   DISCHARGE MEDICATIONS:  1. Glipizide 10 mg b.i.d.  2. Metformin 500 mg b.i.d.  3. Januvia 100 mg daily.  4. Lisinopril 40 mg daily.  5. Zocor 20 mg daily.  6. Prednisone taper 40 mg x2 days, 30 mg x2 days, 20 mg x2 days, and      10 mg daily.  7. Lantus insulin 30 units at bedtime.  8. Flomax 0.4 mg daily.  9. NovoLog sliding scale insulin.   HISTORY OF PRESENT ILLNESS:  The patient is a 45 year old gentleman with  a past medical history of hypertension, diabetes, and dyslipidemia.  He  was admitted to the hospital on Apr 20, 2009, with a chief complaint of  diminished sensation in the legs that began a few days prior.  Symptoms  also included urinary retention and constipation.  He was initially  admitted and seen in consultation by Neurosurgery for possible cauda  equina syndrome.  He is noted on exam to have a T12 sensory level.  An  MRI of the brain and thoracic spine were performed.  The brain study  demonstrated a slight amount of white matter disease at the posterior  horn of the left lateral ventricle, otherwise was normal.  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  On  further testing, it was felt to demonstrate swelling of the cord around  T5 and T6 levels.  There was some cord enhancement in this area around  T3 through T7.  This was thought most consistent with myelitis and a  neurological consultation was requested.  The patient denied  any visual  disturbances or hemisensory or hemiparetic symptoms.   LABORATORY DATA AND HOSPITAL COURSE:  The patient was admitted to the  hospital where he was followed closely by Neurology.  Examination  revealed a clear mental status.  Speech and memory were intact.  Cranial  nerve examination normal.  Motor exam revealed mild weakness of the hip  flexors, otherwise normal throughout.  Sensation examination revealed  diminished pinprick, light touch, and vibration in the lower extremities  with diminished pinprick to about the T10 level.  Proprioception was  intact.  Coordination was intact.  Gait revealed him to be able to rise  slowly from a chair.  His gait was narrow and a bit shuffled and  unsteady.  Reflexes were +2 and symmetrical.  Bilateral Babinski  responses were noted, but no clonus.   The patient was admitted and treated for myelitis of uncertain etiology,  probably transverse myelitis.  Brain MRI was thought not to  be  suggestive of multiple sclerosis.  Evaluation included a urine culture  that was negative.  ANA negative.  SSA and SSB antibodies negative.  Oligoclonal banding, results pending at the time of this dictation.  The  patient had an LP performed that revealed a CSF glucose of 158 and white  count of 3 cells.  Additional studies included herpes simplex virus and  HSV I DNA was not detected.  Varicella zoster by PCR negative, RPR  nonreactive, HIV nonreactive.  Angiotensin-converting enzyme normal.  HTLV-I and II antibodies negative.  Vitamin E level 7.5.  During the  course of the admission, he was placed on initially parenteral and then  oral steroids.  This predictably aggravated his diabetes and he required  insulin therapy with basal Lantus at bedtime as well as mealtime  insulin.  He had persistent urinary retention and required self in and  out catheterization.  At the time of discharge, he was ambulatory with  assistance of a cane and was able to walk  upstairs with minimal  difficulty.   DISPOSITION:  He will be discharged today and will continue to receive  outpatient physical therapy.  He has been instructed to drop by the  office this afternoon for further insulin management and to pick up  supplies of Lantus and NovoLog pen.  He will be further instructed on  insulin management.  He was given a prescription for self urinary  catheterizations.  He will be seen in consultation by Neurology, and  also follow closely by his primary care Reynolds Kittel.   CONDITION ON DISCHARGE:  Stable.      Gordy Savers, MD  Electronically Signed     PFK/MEDQ  D:  05/01/2009  T:  05/01/2009  Job:  [id number]

## 2011-04-05 NOTE — H&P (Signed)
NAME:  CARMELO, Tyler Aguirre NO.:  [id number]   MEDICAL RECORD NO.:  [id number]          PATIENT TYPE:  INP   LOCATION:  1825                         FACILITY:  [facility]   PHYSICIAN:  Michiel Cowboy, MDDATE OF BIRTH:  1966-08-09   DATE OF ADMISSION:  04/20/2009  DATE OF DISCHARGE:                              HISTORY & PHYSICAL   PRIMARY CARE Cameron Schwinn:  Eugenio Hoes. Tawanna Cooler, MD.   CHIEF COMPLAINT:  Generalized weakness and decreased sensation in legs.   Patient is a 45 year old gentleman with history of hypertension and  hyperlipidemia as well as diabetes.  Wednesday he presented to his  primary care Loyde Orth because he was feeling somewhat constipated.  He  received a prescription for MiraLax, went home and stated feeling  better, but then he started feeling like he was urinating a lot.  He  presented again to his primary care Masahiro Iglesia on Thursday, was diagnosed  with prostatitis and started on Cipro.  Patient also noticed that he was  feeling some numbness mainly in his left leg although some in his right  as well.  This was coming and going and did not have any particular  distribution but stated it was just throughout the whole leg.   He had some chills at home but no measurable fever.  Later on he  developed worsening urinary symptoms with a lot of frequency of  urination at which point he presented to the emergency department and  was found to have likely urinary retention.  A urine catheter was placed  and he was sent back home.  At that point he noticed that as soon as the  urine catheter was placed, his neurological symptoms have drastically  improved.  He went home.  At home he continued to have some chills and  today started to have body aches at which point he presented again to  emergency room where he was found to be dehydrated with positive  orthostatics.  He received about 4 liters of IV fluids but was still  feeling some generalized weakness although  his neurological symptoms  have improved.  At this point he states that his right leg is feeling  much better but his left leg still feels somewhat numb although he does  state that he can feel me touching it, but it does not feel quite right.  At first he thought that his foot was more affected than his left leg,  but now he is saying it is probably the whole leg.   Otherwise he endorsed some upper back pain earlier today which was  feeling better when he stretched.  Otherwise no other complaints.  No  nausea, no vomiting, no diarrhea.  Constipation currently resolved.   PAST MEDICAL HISTORY:  Significant for:  1. Diabetes.  2. Hypertension.  3. Hyperlipidemia.   SOCIAL HISTORY:  Patient denies smoking and does not drink alcohol on a  regular basis, just occasional use.  Does not use drugs.   FAMILY HISTORY:  Noncontributory.   ALLERGIES:  No known drug allergies.   MEDICATIONS:  1. Allegra as  needed.  2. Ciprofloxacin 500 mg p.o. b.i.d.  3. Glipizide 10 mg twice daily.  4. Januvia 50/1000 mg twice daily.  5. Lisinopril 40 mg daily.  6. Zocor 20 mg daily.   PHYSICAL EXAMINATION:  VITALS:  Temperature 98.5, blood pressure 133/79,  pulse 101, respirations 20, satting 99% on room air.  Patient currently  appears to be in no acute distress and actually states that he is  feeling way better.  HEAD:  Atraumatic.  Somewhat dryish mucous membranes but normal skin  turgor.  LUNGS:  Clear to auscultation bilaterally.  HEART:  Regular rate and rhythm.  No murmurs, rubs or gallops.  LOWER EXTREMITIES:  Without clubbing, cyanosis or edema.  ABDOMEN:  Slightly obese and nontender, nondistended.  NEUROLOGICALLY:  Strength 5/5 in all four extremities.  Neurologically  intact except for he endorses somewhat unusual sensation in his left  leg, not by any distribution.   LABORATORY DATA:  White blood cell count 9.8, hemoglobin 15.6, sodium  136, potassium 4.1, creatinine 1.34, two days  ago was 1.0.  UA found 3-6  white blood cells and rare bacteria.  MRI of the lumbar spine shows  right foraminal stenosis and mild canal stenosis but otherwise  unremarkable.  EKG shows no acute evidence of ischemia infarction.  Sinus rhythm, heart rate 70s.   ASSESSMENT/PLAN:  This is a 45 year old gentleman with dehydration,  generalized weakness, atypical changes in sensation of his left lower  extremity.  1. Dehydration.  The patient was orthostatic on presentation.  We will      continue to give intravenous fluids.  Check orthostatics in the      morning.  2. Urinary retention.  If patient truly has prostatitis I wonder if it      is secondary to inflammation.  Otherwise we will start on Flomax.      Patient will need to follow up with Urology.  This needs to be      arranged.  We will continue Foley and order renal ultrasound.  3. Left leg numbness.  Since this is somewhat atypical, not in any      particular distribution, relieved by placement of Foley catheter or      at least improved by it.  Unclear etiology at this point.  Lumbar      films are negative.  Patient did endorse some thoracic back pain.      We will start with thoracic spine films.  We will do a CT of the      head.  Check B12 and folate as well as TSH.  If workup is      unremarkable consider MRI of thoracic spine and/or brain if      persists, though this is very atypical presentation.  4. Diabetes.  Hold Glipizide and metformin.  For now we will continue      sliding scale and Januvia alone.  5. History of hyperlipidemia.  Hold Zocor.  Check CK to make sure      patient is not developing rhabdomyolysis or having musculoskeletal      complaints related to that.  6. Hypertension.  Continue lisinopril.  7. Prophylaxis.  Protonix, sequential compression devices.      Michiel Cowboy, MD  Electronically Signed    AVD/MEDQ  D:  04/20/2009  T:  04/20/2009  Job:  [id number]   cc:   Tinnie Gens A. Tawanna Cooler, MD

## 2011-04-27 ENCOUNTER — Other Ambulatory Visit: Payer: Self-pay | Admitting: *Deleted

## 2011-04-27 MED ORDER — GLIPIZIDE 10 MG PO TABS: 10.0000 mg | ORAL_TABLET | Freq: Two times a day (BID) | ORAL | Status: DC

## 2011-07-01 ENCOUNTER — Other Ambulatory Visit: Payer: Self-pay | Admitting: *Deleted

## 2011-07-01 MED ORDER — SITAGLIPTIN PHOS-METFORMIN HCL 50-1000 MG PO TABS: 1.0000 | ORAL_TABLET | Freq: Two times a day (BID) | ORAL | Status: DC

## 2011-07-01 MED ORDER — GLIPIZIDE 10 MG PO TABS: 10.0000 mg | ORAL_TABLET | Freq: Two times a day (BID) | ORAL | Status: DC

## 2015-08-11 LAB — LIPID PANEL
Cholesterol: 256 mg/dL — AB (ref 0–200)
HDL: 38 mg/dL (ref 35–70)
LDL CALC: 169 mg/dL
Triglycerides: 245 mg/dL — AB (ref 40–160)

## 2015-08-11 LAB — HEPATIC FUNCTION PANEL
ALK PHOS: 117 U/L (ref 25–125)
ALT: 54 U/L — AB (ref 10–40)
AST: 57 U/L — AB (ref 14–40)
Bilirubin, Total: 0.8 mg/dL

## 2015-08-11 LAB — TSH: TSH: 2.81 u[IU]/mL (ref 0.41–5.90)

## 2015-08-11 LAB — BASIC METABOLIC PANEL
BUN: 12 mg/dL (ref 4–21)
Creatinine: 0.9 mg/dL (ref 0.6–1.3)
GLUCOSE: 333 mg/dL
Potassium: 4.1 mmol/L (ref 3.4–5.3)
SODIUM: 137 mmol/L (ref 137–147)

## 2015-09-14 LAB — BASIC METABOLIC PANEL
BUN: 12 mg/dL (ref 4–21)
CREATININE: 0.7 mg/dL (ref 0.6–1.3)
Glucose: 206 mg/dL
Potassium: 4 mmol/L (ref 3.4–5.3)
SODIUM: 139 mmol/L (ref 137–147)

## 2015-09-14 LAB — HEPATIC FUNCTION PANEL
ALK PHOS: 100 U/L (ref 25–125)
ALT: 58 U/L — AB (ref 10–40)
AST: 53 U/L — AB (ref 14–40)
Bilirubin, Total: 1 mg/dL

## 2015-11-09 LAB — HEPATIC FUNCTION PANEL
ALK PHOS: 131 U/L — AB (ref 25–125)
ALT: 62 U/L — AB (ref 10–40)
AST: 59 U/L — AB (ref 14–40)
Bilirubin, Total: 0.9 mg/dL

## 2015-11-09 LAB — BASIC METABOLIC PANEL
BUN: 9 mg/dL (ref 4–21)
Creatinine: 0.9 mg/dL (ref 0.6–1.3)
Glucose: 428 mg/dL
POTASSIUM: 4.4 mmol/L (ref 3.4–5.3)
SODIUM: 137 mmol/L (ref 137–147)

## 2016-02-25 DIAGNOSIS — E1165 Type 2 diabetes mellitus with hyperglycemia: Secondary | ICD-10-CM | POA: Diagnosis not present

## 2016-02-25 DIAGNOSIS — E782 Mixed hyperlipidemia: Secondary | ICD-10-CM | POA: Diagnosis not present

## 2016-02-25 DIAGNOSIS — R809 Proteinuria, unspecified: Secondary | ICD-10-CM | POA: Diagnosis not present

## 2016-02-25 DIAGNOSIS — I10 Essential (primary) hypertension: Secondary | ICD-10-CM | POA: Diagnosis not present

## 2016-02-25 DIAGNOSIS — K760 Fatty (change of) liver, not elsewhere classified: Secondary | ICD-10-CM | POA: Diagnosis not present

## 2016-02-25 DIAGNOSIS — E1129 Type 2 diabetes mellitus with other diabetic kidney complication: Secondary | ICD-10-CM | POA: Diagnosis not present

## 2016-02-25 DIAGNOSIS — Z794 Long term (current) use of insulin: Secondary | ICD-10-CM | POA: Diagnosis not present

## 2016-02-29 DIAGNOSIS — I10 Essential (primary) hypertension: Secondary | ICD-10-CM | POA: Diagnosis not present

## 2016-02-29 DIAGNOSIS — R809 Proteinuria, unspecified: Secondary | ICD-10-CM | POA: Diagnosis not present

## 2016-02-29 DIAGNOSIS — E782 Mixed hyperlipidemia: Secondary | ICD-10-CM | POA: Diagnosis not present

## 2016-02-29 DIAGNOSIS — E1129 Type 2 diabetes mellitus with other diabetic kidney complication: Secondary | ICD-10-CM | POA: Diagnosis not present

## 2016-02-29 DIAGNOSIS — Z794 Long term (current) use of insulin: Secondary | ICD-10-CM | POA: Diagnosis not present

## 2016-02-29 DIAGNOSIS — E1165 Type 2 diabetes mellitus with hyperglycemia: Secondary | ICD-10-CM | POA: Diagnosis not present

## 2016-03-01 LAB — HEPATIC FUNCTION PANEL
ALK PHOS: 116 U/L (ref 25–125)
ALT: 67 U/L — AB (ref 10–40)
AST: 80 U/L — AB (ref 14–40)
Bilirubin, Total: 0.9 mg/dL

## 2016-03-01 LAB — LIPID PANEL
CHOLESTEROL: 189 mg/dL (ref 0–200)
HDL: 42 mg/dL (ref 35–70)
LDL CALC: 113 mg/dL
TRIGLYCERIDES: 172 mg/dL — AB (ref 40–160)

## 2016-03-01 LAB — BASIC METABOLIC PANEL
BUN: 13 mg/dL (ref 4–21)
Creatinine: 0.8 mg/dL (ref 0.6–1.3)
Glucose: 110 mg/dL
Potassium: 4.1 mmol/L (ref 3.4–5.3)
Sodium: 142 mmol/L (ref 137–147)

## 2016-08-12 ENCOUNTER — Ambulatory Visit (INDEPENDENT_AMBULATORY_CARE_PROVIDER_SITE_OTHER): Payer: 59 | Admitting: Family Medicine

## 2016-08-12 ENCOUNTER — Encounter: Payer: Self-pay | Admitting: Family Medicine

## 2016-08-12 VITALS — BP 160/60 | HR 83 | Resp 12 | Ht 74.0 in | Wt 295.0 lb

## 2016-08-12 DIAGNOSIS — E119 Type 2 diabetes mellitus without complications: Secondary | ICD-10-CM

## 2016-08-12 DIAGNOSIS — I10 Essential (primary) hypertension: Secondary | ICD-10-CM | POA: Diagnosis not present

## 2016-08-12 DIAGNOSIS — Z794 Long term (current) use of insulin: Secondary | ICD-10-CM

## 2016-08-12 DIAGNOSIS — E669 Obesity, unspecified: Secondary | ICD-10-CM | POA: Insufficient documentation

## 2016-08-12 DIAGNOSIS — J309 Allergic rhinitis, unspecified: Secondary | ICD-10-CM | POA: Diagnosis not present

## 2016-08-12 DIAGNOSIS — Z6837 Body mass index (BMI) 37.0-37.9, adult: Secondary | ICD-10-CM

## 2016-08-12 DIAGNOSIS — R399 Unspecified symptoms and signs involving the genitourinary system: Secondary | ICD-10-CM

## 2016-08-12 DIAGNOSIS — R0609 Other forms of dyspnea: Secondary | ICD-10-CM

## 2016-08-12 DIAGNOSIS — R0600 Dyspnea, unspecified: Secondary | ICD-10-CM

## 2016-08-12 MED ORDER — FLUTICASONE PROPIONATE 50 MCG/ACT NA SUSP: 1.0000 | Freq: Two times a day (BID) | NASAL | 3 refills | Status: DC

## 2016-08-12 NOTE — Patient Instructions (Addendum)
A few things to remember from today's visit:   Essential hypertension  Type 2 diabetes mellitus with insulin therapy (HCC) - Plan: Ambulatory referral to Endocrinology  Allergic rhinitis, unspecified allergic rhinitis type  BMI 37.0-37.9, adult  Urinary symptom or sign - Plan: Urinalysis with Reflex Microscopic  Exertional dyspnea - Plan: Ambulatory referral to Cardiology  Blood pressure goal for most people is less than 140/90. Monitor blood pressure at home.   Elevated blood pressure increases the risk of strokes, heart and kidney disease, and eye problems. Regular physical activity and a healthy diet (DASH diet) usually help. Low salt diet. Take medications as instructed. Caution with some over the counter medications as cold medications, dietary products (for weight loss), and Ibuprofen or Aleve (frequent use);all these medications could cause elevation of blood pressure.  HgA1C goal < 7.0. Avoid sugar added food:regular soft drinks, energy drinks, and sports drinks. candy. cakes. cookies. pies and cobblers. sweet rolls, pastries, and donuts. fruit drinks, such as fruitades and fruit punch. dairy desserts, such as ice cream  Mediterranean diet has showed benefits for sugar control.  How much and what type of carbohydrate foods are important for managing diabetes. The balance between how much insulin is in your body and the carbohydrate you eat makes a difference in your blood glucose levels.  Fasting blood sugar ideally 130 or less, 2 hours after meals less than 180.      Avoid skipping meals, blood sugar might drop and cause serious problems. Remember checking feet periodically, good dental hygiene, and annual eye exam.  Appointment for cardiology and endocrinology will be arranged.  Strongly recommend considering to resume blood pressure medication. Do not start exercising until you see cardiologist.   Please be sure medication list is accurate. If a new problem  present, please set up appointment sooner than planned today.

## 2016-08-12 NOTE — Progress Notes (Signed)
HPI:   Tyler Aguirre is a 51 y.o. male, who is here today to establish care with me.  Former PCP: Dr Tawanna Cooler Last preventive routine visit: over 2-3 years ago.  Concerns today: requesting referral to cardiologist and endocrinologists.  He would like a referral to cardiologist because concerned about heart disease, he is reporting family history of "heart problems", father with valvular disease status post valve replacement.  He is also concerned because for the past month or so he has noted exertional dyspnea and heavy sensation on both upper extremities without exertion, he denies any associated chest pain, palpitation, or diaphoresis.  He has history of allergic rhinitis, he has had nasal congestion and rhinorrhea. Occasional nose bleed upon blowing nose.  A week ago he was treated for otitis media, earache resolved. + Cough with occasional sputum production and a episode of wheezing 6 days ago. He denies any fever or chills. He just completed antibiotic treatment, Augmentin.  Denies tobacco use or Hx of asthma.  Hypertension:   Currently not taking antihypertensive medication, stopped Lisinopril a few months ago because it was causing "terrible" pain, aching shoulders and other joints. According to pt, his BP is usually "normal."    According to patient, he has tried all the antihypertensive medications but has not tolerated it well.  He has not noted unusual headache, visual changes, exertional chest pain, focal weakness, or edema.   Lab Results  Component Value Date   CREATININE 0.8 01/22/2010   BUN 14 01/22/2010   NA 138 01/22/2010   K 4.4 01/22/2010   CL 104 01/22/2010   CO2 31 01/22/2010    Diabetes Mellitus:  According to problem list he has Hx of DM 2, he tells me that he is sure is now DM 1.  He has been following with endocrinologist, Dr Theda Belfast but since he recently relocated he would like to establish with endocrinologists in this  area.  Currently on Lantus 50 U daily and Novolog 50 U tid. Reporting last HgA1C about a month ago, 6.1, at this time he was also taking Invokana, which he recently discontinued due to severe constipation. Checking BS's : Yes, "good" Hypoglycemia: Denies  He is tolerating medications well. He denies abdominal pain, nausea, vomiting, polydipsia, polyuria, or polyphagia. No numbness, tingling, or burning.   Reporting eye exam done 2 months ago and negative.  - He also mentions that about 10 days ago he had a day of difficulty urinating, dysuria, and urinary frequency. Finally he was able to void and noted brownish urine with a "mass", no sure about blood. Symptoms completely resolved after urination, he denies any history of nephrolithiasis but he wonders if he passed a kidney stone. He denies associated abdominal pain, nausea, vomiting. Currently he is not having any urinary symptoms. He has had prior episodes of acute prostatitis, he denies any rectal pain.  Hx of hyperlipidemia, currently he is on Zocor 20 mg daily. He is not exercising regularly and has not been consistent with a healthy diet.   Review of Systems  Constitutional: Negative for activity change, appetite change, fatigue, fever and unexpected weight change.  HENT: Positive for congestion, nosebleeds, postnasal drip and rhinorrhea. Negative for dental problem, ear pain, facial swelling, hearing loss, mouth sores, sinus pressure, sore throat and trouble swallowing.   Eyes: Negative for pain, redness and visual disturbance.  Respiratory: Positive for cough and shortness of breath. Negative for apnea and chest tightness.   Cardiovascular: Negative for  chest pain, palpitations and leg swelling.  Gastrointestinal: Negative for abdominal pain, nausea and vomiting.       No changes in bowel habits.  Endocrine: Negative for polydipsia, polyphagia and polyuria.  Genitourinary: Negative for decreased urine volume, dysuria, flank  pain, genital sores and penile swelling.  Musculoskeletal: Negative for back pain and myalgias.  Skin: Negative for color change and rash.  Allergic/Immunologic: Positive for environmental allergies.  Neurological: Negative for dizziness, seizures, syncope, weakness, numbness and headaches.  Hematological: Negative for adenopathy. Does not bruise/bleed easily.  Psychiatric/Behavioral: Negative for confusion. The patient is not nervous/anxious.       Current Outpatient Prescriptions on File Prior to Visit  Medication Sig Dispense Refill  . Aurora Lancet Super Thin 30G MISC by Does not apply route. As direceted     . glucose blood test strip 1 each by Other route as needed. Use as instructed     . simvastatin (ZOCOR) 20 MG tablet Take 20 mg by mouth at bedtime.       No current facility-administered medications on file prior to visit.      Past Medical History:  Diagnosis Date  . ALLERGIC RHINITIS 03/12/2009  . DIABETES MELLITUS, TYPE II 02/26/2007  . DISC DISEASE, LUMBAR 06/16/2009  . HYPERLIPIDEMIA 02/26/2007  . HYPERTENSION 02/26/2007  . NUMBNESS 04/17/2009  . Overweight(278.02) 02/26/2007  . PROSTATITIS, ACUTE 04/17/2009  . TRANSVERSE MYELITIS 05/01/2009   No Known Allergies  No family history on file.  Social History   Social History  . Marital status: Single    Spouse name: N/A  . Number of children: N/A  . Years of education: N/A   Social History Main Topics  . Smoking status: Never Smoker  . Smokeless tobacco: Never Used  . Alcohol use Yes  . Drug use: No  . Sexual activity: Not Asked   Other Topics Concern  . None   Social History Narrative  . None    Vitals:   08/12/16 1510  BP: (!) 160/60  Pulse: 83  Resp: 12   O2 sat 97% at RA.  Body mass index is 37.88 kg/m.    Physical Exam  Constitutional: He is oriented to person, place, and time. He appears well-developed. No distress.  HENT:  Head: Atraumatic.  Right Ear: Tympanic membrane is not  erythematous. A middle ear effusion is present.  Left Ear: Tympanic membrane is scarred. Tympanic membrane is not erythematous. A middle ear effusion is present.  Nose: Septal deviation present. No rhinorrhea or nasal septal hematoma. No epistaxis. Right sinus exhibits no maxillary sinus tenderness and no frontal sinus tenderness. Left sinus exhibits no maxillary sinus tenderness and no frontal sinus tenderness.  Mouth/Throat: Oropharynx is clear and moist and mucous membranes are normal.  Postnasal drainage. Hypertrophic turbinates.   Eyes: Conjunctivae and EOM are normal. Pupils are equal, round, and reactive to light.  Neck: No JVD present. No thyroid mass and no thyromegaly present.  Cardiovascular: Normal rate and regular rhythm.   No murmur heard. Pulses:      Dorsalis pedis pulses are 2+ on the right side, and 2+ on the left side.  Respiratory: Effort normal and breath sounds normal. No respiratory distress.  GI: Soft. He exhibits no mass. There is no hepatomegaly. There is no tenderness.  Musculoskeletal: He exhibits edema (1+ pitting LE edema bilatera). He exhibits no tenderness.  Lymphadenopathy:    He has no cervical adenopathy.  Neurological: He is alert and oriented to person, place, and time.  He has normal strength. Coordination and gait normal.  Skin: Skin is warm. No rash noted. No erythema.  Psychiatric: His mood appears anxious.  Well groomed, good eye contact.      ASSESSMENT AND PLAN:     Tyler RuizJohn was seen today for new patient (initial visit).  Diagnoses and all orders for this visit:  Type 2 diabetes mellitus with insulin therapy (HCC)  HgA1C at goal, per pt report. No changes in current management. Regular exercise and healthy diet with avoidance of added sugar food intake is an important part of treatment and recommended. Annual eye exam, periodic dental and foot care recommended. F/U in 3-4 months with endocrinologists, referral placed.  -      Ambulatory referral to Endocrinology  Essential hypertension  Not well controlled. Better when re-checked after a few minutes but still elevated. Reporting "normal" BP readings. He is not interested in pharmacologic treatment. Possible complications of elevated BP discussed. Periodic eye examination. F/U in 3-4 months.  Allergic rhinitis, unspecified allergic rhinitis type  Symptomatic. Recommend intranasal steroid and OTC antihistaminic daily. Avoid OTC decongestants or cold medications. Explained that after URI congestion and cough can last a few weeks. Autoinflation maneuvers may help with ear effusion, eustachian tube dysfunction. Follow-up as needed.  -     fluticasone (FLONASE) 50 MCG/ACT nasal spray; Place 1 spray into both nostrils 2 (two) times daily.  BMI 37.0-37.9, adult  We discussed benefits of wt loss as well as adverse effects of obesity. Consistency with healthy diet and physical activity recommended. Hold on regular exercise until cardiology evaluation.   Urinary symptom or sign  Single episode and resolved. Further recommendations will be given according to lab results.  -     Urinalysis with Reflex Microscopic  Exertional dyspnea  We discussed possible causes: Allergic rhinitis, deconditioning, hypertension, and cardiac among some. + Risk factors for CVD. Today he is not symptomatic, I do not have EKG machine today (broken). Lung auscultation is negative, I am treating imaging is needed at this time but is to be considered if that cough is persistent. Referral to cardiology was place, recommend holding on exercising until cardiology evaluation. Clearly instructed about warning signs.  -     Ambulatory referral to Cardiology     Blood lab work was not ordered today, he is reporting labs done a month ago, will try to obtain records. I will see him back in 3-4 months for routine physical and at the time we will follow on some of the problems addressed  today.     Emerald Shor G. [place], MD  [facility]. [place] office.

## 2016-08-12 NOTE — Progress Notes (Signed)
Pre visit review using our clinic review tool, if applicable. No additional management support is needed unless otherwise documented below in the visit note. 

## 2016-08-22 ENCOUNTER — Other Ambulatory Visit (INDEPENDENT_AMBULATORY_CARE_PROVIDER_SITE_OTHER): Payer: 59

## 2016-08-22 DIAGNOSIS — Z0000 Encounter for general adult medical examination without abnormal findings: Secondary | ICD-10-CM

## 2016-08-22 LAB — LIPID PANEL
CHOL/HDL RATIO: 5
Cholesterol: 214 mg/dL — ABNORMAL HIGH (ref 0–200)
HDL: 46 mg/dL (ref >39.00)
LDL Cholesterol: 150 mg/dL — ABNORMAL HIGH (ref 0–99)
NONHDL: 168.44
Triglycerides: 92 mg/dL (ref 0.0–149.0)
VLDL: 18.4 mg/dL (ref 0.0–40.0)

## 2016-08-22 LAB — CBC WITH DIFFERENTIAL/PLATELET
Basophils Absolute: 0 10*3/uL (ref 0.0–0.1)
Basophils Relative: 0.7 % (ref 0.0–3.0)
EOS PCT: 2.5 % (ref 0.0–5.0)
Eosinophils Absolute: 0.1 10*3/uL (ref 0.0–0.7)
HEMATOCRIT: 39.3 % (ref 39.0–52.0)
HEMOGLOBIN: 13.5 g/dL (ref 13.0–17.0)
LYMPHS ABS: 1.7 10*3/uL (ref 0.7–4.0)
Lymphocytes Relative: 35.5 % (ref 12.0–46.0)
MCHC: 34.3 g/dL (ref 30.0–36.0)
MCV: 94.5 fl (ref 78.0–100.0)
MONO ABS: 0.6 10*3/uL (ref 0.1–1.0)
Monocytes Relative: 13.1 % — ABNORMAL HIGH (ref 3.0–12.0)
NEUTROS PCT: 48.2 % (ref 43.0–77.0)
Neutro Abs: 2.3 10*3/uL (ref 1.4–7.7)
Platelets: 117 10*3/uL — ABNORMAL LOW (ref 150.0–400.0)
RBC: 4.16 Mil/uL — AB (ref 4.22–5.81)
RDW: 14.2 % (ref 11.5–15.5)
WBC: 4.8 10*3/uL (ref 4.0–10.5)

## 2016-08-22 LAB — MICROALBUMIN / CREATININE URINE RATIO
CREATININE, U: 209.1 mg/dL
MICROALB UR: 4 mg/dL — AB (ref 0.0–1.9)
Microalb Creat Ratio: 1.9 mg/g (ref 0.0–30.0)

## 2016-08-22 LAB — TSH: TSH: 2.92 u[IU]/mL (ref 0.35–4.50)

## 2016-08-22 LAB — BASIC METABOLIC PANEL
BUN: 15 mg/dL (ref 6–23)
CO2: 28 mEq/L (ref 19–32)
Calcium: 8.3 mg/dL — ABNORMAL LOW (ref 8.4–10.5)
Chloride: 105 mEq/L (ref 96–112)
Creatinine, Ser: 0.68 mg/dL (ref 0.40–1.50)
GFR: 131.79 mL/min (ref >60.00)
GLUCOSE: 113 mg/dL — AB (ref 70–99)
POTASSIUM: 4.2 meq/L (ref 3.5–5.1)
Sodium: 139 mEq/L (ref 135–145)

## 2016-08-22 LAB — HEPATIC FUNCTION PANEL
ALBUMIN: 3.2 g/dL — AB (ref 3.5–5.2)
ALK PHOS: 80 U/L (ref 39–117)
ALT: 47 U/L (ref 0–53)
AST: 53 U/L — AB (ref 0–37)
BILIRUBIN DIRECT: 0.2 mg/dL (ref 0.0–0.3)
TOTAL PROTEIN: 6.8 g/dL (ref 6.0–8.3)
Total Bilirubin: 1.1 mg/dL (ref 0.2–1.2)

## 2016-08-22 LAB — POC URINALSYSI DIPSTICK (AUTOMATED)
Bilirubin, UA: NEGATIVE
Ketones, UA: NEGATIVE
Leukocytes, UA: NEGATIVE
NITRITE UA: NEGATIVE
PH UA: 6
RBC UA: NEGATIVE
SPEC GRAV UA: 1.025
UROBILINOGEN UA: 2

## 2016-08-22 LAB — HEMOGLOBIN A1C: Hgb A1c MFr Bld: 6.6 % — ABNORMAL HIGH (ref 4.6–6.5)

## 2016-08-25 ENCOUNTER — Encounter: Payer: Self-pay | Admitting: Family Medicine

## 2016-08-25 ENCOUNTER — Ambulatory Visit (INDEPENDENT_AMBULATORY_CARE_PROVIDER_SITE_OTHER): Payer: 59 | Admitting: Family Medicine

## 2016-08-25 VITALS — BP 150/65 | HR 86 | Temp 98.6°F | Resp 12 | Ht 74.0 in | Wt 297.1 lb

## 2016-08-25 DIAGNOSIS — Z23 Encounter for immunization: Secondary | ICD-10-CM | POA: Diagnosis not present

## 2016-08-25 DIAGNOSIS — K625 Hemorrhage of anus and rectum: Secondary | ICD-10-CM | POA: Diagnosis not present

## 2016-08-25 DIAGNOSIS — E785 Hyperlipidemia, unspecified: Secondary | ICD-10-CM

## 2016-08-25 DIAGNOSIS — M722 Plantar fascial fibromatosis: Secondary | ICD-10-CM

## 2016-08-25 DIAGNOSIS — K644 Residual hemorrhoidal skin tags: Secondary | ICD-10-CM

## 2016-08-25 DIAGNOSIS — I10 Essential (primary) hypertension: Secondary | ICD-10-CM | POA: Diagnosis not present

## 2016-08-25 DIAGNOSIS — Z0000 Encounter for general adult medical examination without abnormal findings: Secondary | ICD-10-CM

## 2016-08-25 MED ORDER — HYDROCORTISONE 2.5 % RE CREA: 1.0000 "application " | TOPICAL_CREAM | Freq: Two times a day (BID) | RECTAL | 0 refills | Status: DC

## 2016-08-25 NOTE — Progress Notes (Signed)
Pre visit review using our clinic review tool, if applicable. No additional management support is needed unless otherwise documented below in the visit note. 

## 2016-08-25 NOTE — Patient Instructions (Addendum)
A few things to remember from today's visit:   Routine general medical examination at a health care facility - Plan: Pneumococcal Polysaccharide 23  Rectal bleed - Plan: Ambulatory referral to Gastroenterology  Essential hypertension  Hyperlipidemia, unspecified hyperlipidemia type  Plantar fasciitis of left foot   At least 150 minutes of moderate exercise per week, daily brisk walking for 15-30 min is a good exercise option. Healthy diet low in saturated (animal) fats and sweets and consisting of fresh fruits and vegetables, lean meats such as fish and white chicken and whole grains.  - Vaccines:  Tdap vaccine every 10 years.  Shingles vaccine recommended at age 73, could be given after 49 years of age but not sure about insurance coverage.  Pneumonia vaccines:  Prevnar 13 at 65 and Pneumovax at 66. Because diabetes, starts earlier.   -Screening recommendations for low/normal risk males:    Colonoscopy for colon cancer screening at age 45 and until age 69. Earlier if blood in stool.  Prostate cancer screening: some controversy, usually at 50.              Resume Lisinopril 20 mg daily. Goal blood pressure <140/90.  You also need to resume cholesterol medication.            Plantar Fasciitis Plantar fasciitis is a painful foot condition that affects the heel. It occurs when the band of tissue that connects the toes to the heel bone (plantar fascia) becomes irritated. This can happen after exercising too much or doing other repetitive activities (overuse injury). The pain from plantar fasciitis can range from mild irritation to severe pain that makes it difficult for you to walk or move. The pain is usually worse in the morning or after you have been sitting or lying down for a while. CAUSES This condition may be caused by:  Standing for long periods of time.  Wearing shoes that do not fit.  Doing high-impact activities, including running,  aerobics, and ballet.  Being overweight.  Having an abnormal way of walking (gait).  Having tight calf muscles.  Having high arches in your feet.  Starting a new athletic activity. SYMPTOMS The main symptom of this condition is heel pain. Other symptoms include:  Pain that gets worse after activity or exercise.  Pain that is worse in the morning or after resting.  Pain that goes away after you walk for a few minutes. DIAGNOSIS This condition may be diagnosed based on your signs and symptoms. Your health care provider will also do a physical exam to check for:  A tender area on the bottom of your foot.  A high arch in your foot.  Pain when you move your foot.  Difficulty moving your foot. You may also need to have imaging studies to confirm the diagnosis. These can include:  X-rays.  Ultrasound.  MRI. TREATMENT  Treatment for plantar fasciitis depends on the severity of the condition. Your treatment may include:  Rest, ice, and over-the-counter pain medicines to manage your pain.  Exercises to stretch your calves and your plantar fascia.  A splint that holds your foot in a stretched, upward position while you sleep (night splint).  Physical therapy to relieve symptoms and prevent problems in the future.  Cortisone injections to relieve severe pain.  Extracorporeal shock wave therapy (ESWT) to stimulate damaged plantar fascia with electrical impulses. It is often used as a last resort before surgery.  Surgery, if other treatments have not worked after 12 months. HOME CARE  INSTRUCTIONS  Take medicines only as directed by your health care provider.  Avoid activities that cause pain.  Roll the bottom of your foot over a bag of ice or a bottle of cold water. Do this for 20 minutes, 3-4 times a day.  Perform simple stretches as directed by your health care provider.  Try wearing athletic shoes with air-sole or gel-sole cushions or soft shoe inserts.  Wear a  night splint while sleeping, if directed by your health care provider.  Keep all follow-up appointments with your health care provider. PREVENTION   Do not perform exercises or activities that cause heel pain.  Consider finding low-impact activities if you continue to have problems.  Lose weight if you need to. The best way to prevent plantar fasciitis is to avoid the activities that aggravate your plantar fascia. SEEK MEDICAL CARE IF:  Your symptoms do not go away after treatment with home care measures.  Your pain gets worse.  Your pain affects your ability to move or do your daily activities.   This information is not intended to replace advice given to you by your health care provider. Make sure you discuss any questions you have with your health care provider.   Document Released: 08/02/2001 Document Revised: 07/29/2015 Document Reviewed: 09/17/2014 Elsevier Interactive Patient Education [employer]2016 Elsevier Inc.

## 2016-08-25 NOTE — Progress Notes (Signed)
HPI:  Mr. Tyler Aguirre Tyler Aguirre is a 50 y.o.male here today for his routine physical examination.  He lives alone, close by friends.  He tried to follow a healthy diet but has not been consistent, he does no exercise regularly.   Chronic medical problems: DM,HLD,HTN, and allergic rhinitis/allergies among some.  Hx of STD's: Denies.   -Denies high alcohol intake, tobacco use, or Hx of illicit drug use. He had lab work done recently.    -Concerns and/or follow up today: 4 days of left heel pain, achy, intermittent, worse when first gets up and better after a few steps. Started after wearing old shoes with thin sole. Pain is mild.  No Hx of trauma.   HTN, he is not on antihypertensive medication. Stopped Lisinopril a few weeks ago because started with arthralgias ans achy sensation. He was not sure which of his meds was causing problems, so stopped statin and antihypertensive medication.  He is not checking BP at home. Last OV, 08/12/16, he was c/o exertional dyspnea, has not had any since last OV. Denies severe/frequent headache, visual changes, chest pain, palpitation, claudication, focal weakness, or edema.    Chemistry      Component Value Date/Time   NA 139 08/22/2016 0835   K 4.2 08/22/2016 0835   CL 105 08/22/2016 0835   CO2 28 08/22/2016 0835   BUN 15 08/22/2016 0835   CREATININE 0.68 08/22/2016 0835      Component Value Date/Time   CALCIUM 8.3 (L) 08/22/2016 0835   ALKPHOS 80 08/22/2016 0835   AST 53 (H) 08/22/2016 0835   ALT 47 08/22/2016 0835   BILITOT 1.1 08/22/2016 0835     AST has been mildly elevated in the past.  HLD, not on statin, he was on Zocor 20 mg.  Lab Results  Component Value Date   CHOL 214 (H) 08/22/2016   HDL 46.00 08/22/2016   LDLCALC 150 (H) 08/22/2016   LDLDIRECT 147.8 01/22/2010   TRIG 92.0 08/22/2016   CHOLHDL 5 08/22/2016    He has appt with cardiologists and endocrinologists already scheduled.  -Today he is also c/o  blood on tissue after defecation, last episode a few weeks ago. Attributed to severe constipation after starting Invokana and improved after discontinuation of medication. + Dyschezia.  Denies abdominal pain, nausea, vomiting, or melena. FHx of colon cancer negative.   Review of Systems  Constitutional: Negative for activity change, appetite change, fatigue, fever and unexpected weight change.  HENT: Positive for congestion, postnasal drip and rhinorrhea. Negative for dental problem, nosebleeds, sinus pressure, sore throat, trouble swallowing and voice change.   Eyes: Negative for pain, redness and visual disturbance.  Respiratory: Negative for apnea, cough, shortness of breath and wheezing.   Cardiovascular: Negative for chest pain, palpitations and leg swelling.  Gastrointestinal: Positive for anal bleeding. Negative for abdominal pain, nausea and vomiting.  Endocrine: Negative for cold intolerance, heat intolerance, polydipsia, polyphagia and polyuria.  Genitourinary: Negative for decreased urine volume, difficulty urinating, dysuria, genital sores, hematuria and testicular pain.  Musculoskeletal: Positive for arthralgias. Negative for gait problem and myalgias.  Skin: Negative for color change and rash.  Allergic/Immunologic: Positive for environmental allergies.  Neurological: Negative for dizziness, syncope, facial asymmetry, speech difficulty, weakness and headaches.  Hematological: Negative for adenopathy. Does not bruise/bleed easily.  Psychiatric/Behavioral: Negative for confusion and sleep disturbance. The patient is nervous/anxious.      Current Outpatient Prescriptions on File Prior to Visit  Medication Sig Dispense Refill  .  Aurora Lancet Super Thin 30G MISC by Does not apply route. As direceted     . fluticasone (FLONASE) 50 MCG/ACT nasal spray Place 1 spray into both nostrils 2 (two) times daily. 16 g 3  . glucose blood test strip 1 each by Other route as needed. Use as  instructed     . insulin aspart (NOVOLOG) 100 UNIT/ML FlexPen Inject 50 Units into the skin 3 (three) times daily before meals.    . insulin glargine (LANTUS) 100 unit/mL SOPN Inject 50 Units into the skin at bedtime.    . simvastatin (ZOCOR) 20 MG tablet Take 20 mg by mouth at bedtime.       No current facility-administered medications on file prior to visit.      Past Medical History:  Diagnosis Date  . ALLERGIC RHINITIS 03/12/2009  . DIABETES MELLITUS, TYPE II 02/26/2007  . DISC DISEASE, LUMBAR 06/16/2009  . HYPERLIPIDEMIA 02/26/2007  . HYPERTENSION 02/26/2007  . NUMBNESS 04/17/2009  . Overweight(278.02) 02/26/2007  . PROSTATITIS, ACUTE 04/17/2009  . TRANSVERSE MYELITIS 05/01/2009    No Known Allergies  Family History  Problem Relation Age of Onset  . Heart disease Mother     valve dz  . Arthritis Mother     RA  . Heart disease Maternal Uncle     CAD  . Diabetes Paternal Grandmother   . Cancer Paternal Grandfather     lung  . Mental retardation Paternal Grandfather     suicided    Social History   Social History  . Marital status: Single    Spouse name: N/A  . Number of children: N/A  . Years of education: N/A   Social History Main Topics  . Smoking status: Never Smoker  . Smokeless tobacco: Never Used  . Alcohol use Yes  . Drug use: No  . Sexual activity: Yes    Birth control/ protection: Condom   Other Topics Concern  . None   Social History Narrative  . None     Vitals:   08/25/16 1454  BP: (!) 150/65  Pulse: 86  Resp: 12  Temp: 98.6 F (37 C)   Body mass index is 38.15 kg/m.  O2 sat 98% at RA.  Wt Readings from Last 3 Encounters:  08/25/16 297 lb 2 oz (134.8 kg)  08/12/16 295 lb (133.8 kg)  03/18/11 (!) 271 lb (122.9 kg)     Physical Exam  Nursing note and vitals reviewed. Constitutional: He is oriented to person, place, and time. He appears well-developed. No distress.  HENT:  Head: Atraumatic.  Right Ear: Hearing, tympanic membrane,  external ear and ear canal normal.  Left Ear: Hearing, tympanic membrane, external ear and ear canal normal.  Mouth/Throat: Oropharynx is clear and moist and mucous membranes are normal.  Eyes: Conjunctivae and EOM are normal. Pupils are equal, round, and reactive to light.  Neck: Normal range of motion. No thyroid mass and no thyromegaly present.  Cardiovascular: Normal rate and regular rhythm.   No murmur heard. Pulses:      Dorsalis pedis pulses are 2+ on the right side, and 2+ on the left side.  Respiratory: Effort normal and breath sounds normal. No respiratory distress.  GI: Soft. He exhibits no mass. There is no tenderness. Hernia confirmed negative in the right inguinal area and confirmed negative in the left inguinal area.  Genitourinary: Rectal exam shows external hemorrhoid (at 7 Oclock when lying on left side). Rectal exam shows no mass, no tenderness and guaiac  negative stool. Right testis shows no mass, no swelling and no tenderness. Left testis shows no mass, no swelling and no tenderness. No penile erythema or penile tenderness. No discharge found.  Musculoskeletal: He exhibits no edema or tenderness.  No major deformities appreciated and no signs of synovitis. Pain upon palpation of left heel. No edema or erythema appreciated.  Lymphadenopathy:    He has no cervical adenopathy.       Right: No supraclavicular adenopathy present.       Left: No supraclavicular adenopathy present.  Neurological: He is alert and oriented to person, place, and time. He has normal strength. No cranial nerve deficit or sensory deficit. Coordination and gait normal.  Skin: Skin is warm. No rash noted. No erythema.  Psychiatric: His mood appears anxious. Cognition and memory are normal.  Well groomed, good eye contact.        ASSESSMENT AND PLAN:   Discussed the following assessment and plan:   Hamid was seen today for annual exam.  Diagnoses and all orders for this visit:    Routine  general medical examination at a health care facility   We discussed the importance of regular physical activity and healthy diet for prevention of chronic illness and/or complications. Preventive guidelines reviewed. Vaccination updated. Aspirin for primary prevention discussed and recommended. Lab results reviewed.  Next CPE in 1-2 years.   -     Pneumococcal Polysaccharide 23   Rectal bleed Possible causes dicussed. Most likely related to hemorrhoids irritation/constipation. Instructed about warning signs.  -     Ambulatory referral to Gastroenterology  Essential hypertension  Not well controlled. Possible complications of elevated BP discussed. Resume Lisinopril 20 mg daily. Monitor BP at home. Annual eye examination. Has appt with cardiologist, so f/u in 3-4 months.  Hyperlipidemia, unspecified hyperlipidemia type  Recommend resuming statin medication. Benefits of statin discussed as well as side effects, if any we could change to Pravastatin or Crestor or Livalo. F/U in 4 months.   Plantar fasciitis of left foot  Stretching exercises, comfortable shoe wear with soft inserts and night splints may help. Podiatry evaluation may be necessary.  External hemorrhoids without complication  Adequate fiber and fluid intake to prevent constipation. Avoid prolonged toilet time. Bath sitz and topical steroid recommended. F/U as needed.   -     hydrocortisone (ANUSOL-HC) 2.5 % rectal cream; Place 1 application rectally 2 (two) times daily.     Return in 3 months (on 11/25/2016) for 3-4 months HTN.     Lorma Heater G. [place], MD  [facility]. [place] office.

## 2016-09-01 ENCOUNTER — Ambulatory Visit (INDEPENDENT_AMBULATORY_CARE_PROVIDER_SITE_OTHER): Payer: 59 | Admitting: Cardiovascular Disease

## 2016-09-01 ENCOUNTER — Encounter: Payer: Self-pay | Admitting: Cardiovascular Disease

## 2016-09-01 VITALS — BP 156/80 | HR 75 | Ht 72.0 in | Wt 292.0 lb

## 2016-09-01 DIAGNOSIS — I10 Essential (primary) hypertension: Secondary | ICD-10-CM

## 2016-09-01 DIAGNOSIS — E1169 Type 2 diabetes mellitus with other specified complication: Secondary | ICD-10-CM | POA: Diagnosis not present

## 2016-09-01 DIAGNOSIS — R0602 Shortness of breath: Secondary | ICD-10-CM

## 2016-09-01 DIAGNOSIS — E785 Hyperlipidemia, unspecified: Secondary | ICD-10-CM | POA: Diagnosis not present

## 2016-09-01 DIAGNOSIS — E669 Obesity, unspecified: Secondary | ICD-10-CM

## 2016-09-01 MED ORDER — IRBESARTAN 150 MG PO TABS: 150.0000 mg | ORAL_TABLET | Freq: Every day | ORAL | 11 refills | Status: DC

## 2016-09-01 NOTE — Progress Notes (Signed)
Cardiology Consultation Note    Date:  09/02/2016   ID:  Tyler Aguirre, DOB 10/23/1966, MRN [id number]  PCP:  Betty SwazilandJordan, MD  Cardiologist:   Thurmon FairMihai Ac Colan, MD  Referred in consultation for: Exertional dyspnea Chief Complaint  Patient presents with  . Follow-up    New patient.  . Shortness of Breath    Exertional.    History of Present Illness:  Tyler BossierJohn Mae Herda is a 50 y.o. male with severe obesity, diabetes mellitus, hypertension and hyperlipidemia who presents for evaluation of exertional dyspnea. He has noticed worsening exertional dyspnea since September. He denies any exertional chest discomfort and has not had leg edema, orthopnea or PND. He works full-time in [employer] and travels long distances. He does not exercise regularly.  He had arthralgia and stopped both his lisinopril and his statin. The suspicion was that the statin was causing the symptoms but he developed severe arthralgia when he restarted lisinopril within 2 or 3 days. When he stopped the lisinopril again the arthralgia also resolved. He is currently not taking any antihypertensive medication and has not restarted the statin either. His most recent LDL cholesterol is 150.   He has also had problems with diabetes control and is currently taking insulin. Currently has good glycemic control with a hemoglobin A1c of 6.7%, but historically diabetes control has been mediocre. He has had diabetes since his 3620s and has been severely obese his whole life. At one point he weighed 350 pounds so his current weight of 292 (BMI 39.6 is a big improvement. He intends to continue losing weight. He did not tolerate interval, due to severe constipation and did not tolerate metformin due to severe diarrhea.  She denies intermittent claudication but does have some symptoms of neuropathy. He has not had any visual problems or other focal neurological complaints. He denies polyuria or polydipsia appetite changes, change in bowel pattern  recently, GI bleeding, cough or hemoptysis, hot flashes, intolerance to heat or cold.  Past Medical History:  Diagnosis Date  . ALLERGIC RHINITIS 03/12/2009  . DIABETES MELLITUS, TYPE II 02/26/2007  . DISC DISEASE, LUMBAR 06/16/2009  . HYPERLIPIDEMIA 02/26/2007  . HYPERTENSION 02/26/2007  . NUMBNESS 04/17/2009  . Overweight(278.02) 02/26/2007  . PROSTATITIS, ACUTE 04/17/2009  . TRANSVERSE MYELITIS 05/01/2009    History reviewed. No pertinent surgical history.  Current Medications: Outpatient Medications Prior to Visit  Medication Sig Dispense Refill  . Aurora Lancet Super Thin 30G MISC by Does not apply route. As direceted     . fluticasone (FLONASE) 50 MCG/ACT nasal spray Place 1 spray into both nostrils 2 (two) times daily. 16 g 3  . glucose blood test strip 1 each by Other route as needed. Use as instructed     . hydrocortisone (ANUSOL-HC) 2.5 % rectal cream Place 1 application rectally 2 (two) times daily. 30 g 0  . insulin aspart (NOVOLOG) 100 UNIT/ML FlexPen Inject 50 Units into the skin 3 (three) times daily before meals.    . insulin glargine (LANTUS) 100 unit/mL SOPN Inject 50 Units into the skin at bedtime.    . simvastatin (ZOCOR) 20 MG tablet Take 20 mg by mouth at bedtime.       No facility-administered medications prior to visit.      Allergies:   Ibuprofen and Metformin and related   Social History   Social History  . Marital status: Single    Spouse name: N/A  . Number of children: N/A  . Years of education: N/A  Social History Main Topics  . Smoking status: Never Smoker  . Smokeless tobacco: Never Used  . Alcohol use Yes  . Drug use: No  . Sexual activity: Yes    Birth control/ protection: Condom   Other Topics Concern  . None   Social History Narrative  . None     Family History:  The patient's family history includes Arthritis in his mother; Cancer in his paternal grandfather; Diabetes in his paternal grandmother; Heart disease in his maternal uncle and  mother; Mental retardation in his paternal grandfather.   ROS:   Please see the history of present illness.    ROS All other systems reviewed and are negative.   PHYSICAL EXAM:   VS:  BP (!) 156/80   Pulse 75   Ht 6' (1.829 m)   Wt 292 lb (132.5 kg)   BMI 39.60 kg/m    GEN: Well nourished, well developed, in no acute distress  HEENT: normal  Neck: no JVD, carotid bruits, or masses Cardiac: RRR; no murmurs  or rubs, has a distinct S4 gallop,no edema  Respiratory:  clear to auscultation bilaterally, normal work of breathing GI: soft, nontender, nondistended, + BS MS: no deformity or atrophy  Skin: warm and dry, no rash Neuro:  Alert and Oriented x 3, Strength and sensation are intact Psych: euthymic mood, full affect  Wt Readings from Last 3 Encounters:  09/01/16 292 lb (132.5 kg)  08/25/16 297 lb 2 oz (134.8 kg)  08/12/16 295 lb (133.8 kg)      Studies/Labs Reviewed:   EKG:  EKG is ordered today.  The ekg ordered today demonstrates Normal sinus rhythm, QTC 424 ms  Recent Labs: 08/22/2016: ALT 47; BUN 15; Creatinine, Ser 0.68; Hemoglobin 13.5; Platelets 117.0; Potassium 4.2; Sodium 139; TSH 2.92   Lipid Panel    Component Value Date/Time   CHOL 214 (H) 08/22/2016 0835   TRIG 92.0 08/22/2016 0835   TRIG 200 (H) 10/17/2006 1033   HDL 46.00 08/22/2016 0835   CHOLHDL 5 08/22/2016 0835   VLDL 18.4 08/22/2016 0835   LDLCALC 150 (H) 08/22/2016 0835   LDLDIRECT 147.8 01/22/2010 0851    Additional studies/ records that were reviewed today include:  Notes from Dr. Betty Swaz[place]    ASSESSMENT:    1. Shortness of breath   2. Essential hypertension   3. Diabetes mellitus type 2 in obese (HCC)   4. Hyperlipidemia, unspecified hyperlipidemia type   5. Severe obesity (BMI 35.0-39.9) with comorbidity (HCC)      PLAN:  In order of problems listed above:  1. Dyspnea: His severe obesity and deconditioning might explain his dyspnea, but the presence of an S4 gallop raises  the possibility of diastolic heart failure and/or coronary insufficiency. We'll schedule him for a treadmill stress test and echocardiogram. We'll call him with the results of these tests if they are normal or show mild abnormalities. If left ventricular systolic function is depressed or he has an abnormal stress test will bring him back to discuss the next steps in evaluation. 2. HTN: The side effect of arthralgia or with ACE inhibitor is peculiar, but was confirmed by re-challenging him with the drug. We'll try to use an angiotensin receptor blocker since this will also offer renal function protection in the setting of diabetes. 3. DM: Requiring very high doses of insulin, consistent with severe insulin resistance. He understands the need for weight loss and regular physical exercise. He has started to improve his diet. Recently glycemic  control is much better and he has lost some weight. He is encouraged to keep pursuing this healthier lifestyle. 4. HLP: It seems that the ACE inhibitor may have been responsible for his arthralgia, rather than the statin. If he tolerates irbesartan without side effects I would then suggest restarting his simvastatin. Obviously there are other potent statins that would be beneficial if the simvastatin is either poorly tolerated or does not help him  reach target LDL less than 100. Also reviewed healthy diet in the setting of mixed hyperlipidemia and diabetes mellitus. 5. Obesity: His BMI is approximately 40. Despite this, his current weight is actually a big improvement from a peak weight of 350 pounds. He is congratulated for his success in weight loss so far and encouraged to keep up sustained efforts at weight loss  Medication Adjustments/Labs and Tests Ordered: Current medicines are reviewed at length with the patient today.  Concerns regarding medicines are outlined above.  Medication changes, Labs and Tests ordered today are listed in the Patient Instructions  below. Patient Instructions  Medication Instructions: Dr Royann Shivers has recommended making the following medication changes: 1. START Irbesartan 150 mg - take 1 tablet by mouth daily  Labwork: NONE ORDERED  Testing/Procedures: 1. Echocardiogram - Your physician has requested that you have an echocardiogram. Echocardiography is a painless test that uses sound waves to create images of your heart. It provides your doctor with information about the size and shape of your heart and how well your heart's chambers and valves are working. This procedure takes approximately one hour. There are no restrictions for this procedure. This will be performed at our [facility] location - [address].  2. Exercise Tolerace Test (GXT) - Your physician has requested that you have an exercise tolerance test. For further information please visit [email or web address] Please also follow instruction sheet, as given.  Follow-up: Dr Royann Shivers recommends that you schedule a follow-up appointment in 1 year. You will receive a reminder letter in the mail two months in advance. If you don't receive a letter, please call our office to schedule the follow-up appointment.  If you need a refill on your cardiac medications before your next appointment, please call your pharmacy.    Signed, Thurmon Fair, MD  09/02/2016 1:37 PM    [facility] [address] [place], [place], [place]  [address] Phone: [phone]; Fax: [phone]

## 2016-09-01 NOTE — Patient Instructions (Signed)
Medication Instructions: Dr Royann Shiversroitoru has recommended making the following medication changes: 1. START Irbesartan 150 mg - take 1 tablet by mouth daily  Labwork: NONE ORDERED  Testing/Procedures: 1. Echocardiogram - Your physician has requested that you have an echocardiogram. Echocardiography is a painless test that uses sound waves to create images of your heart. It provides your doctor with information about the size and shape of your heart and how well your heart's chambers and valves are working. This procedure takes approximately one hour. There are no restrictions for this procedure. [facility] - [address], [address].  2. Exercise Tolerace Test (GXT) - Your physician has requested that you have an exercise tolerance test. For further information please visit [email or web address] Please also follow instruction sheet, as given.  Follow-up: Dr Royann Shiversroitoru recommends that you schedule a follow-up appointment in 1 year. You will receive a reminder letter in the mail two months in advance. If you don't receive a letter, please call our office to schedule the follow-up appointment.  If you need a refill on your cardiac medications before your next appointment, please call your pharmacy.

## 2016-09-23 ENCOUNTER — Ambulatory Visit (HOSPITAL_COMMUNITY): Payer: 59 | Attending: Cardiovascular Disease

## 2016-09-23 ENCOUNTER — Other Ambulatory Visit: Payer: Self-pay

## 2016-09-23 ENCOUNTER — Ambulatory Visit (INDEPENDENT_AMBULATORY_CARE_PROVIDER_SITE_OTHER): Payer: 59

## 2016-09-23 ENCOUNTER — Other Ambulatory Visit (HOSPITAL_COMMUNITY): Payer: 59

## 2016-09-23 DIAGNOSIS — I10 Essential (primary) hypertension: Secondary | ICD-10-CM

## 2016-09-23 DIAGNOSIS — R0602 Shortness of breath: Secondary | ICD-10-CM | POA: Diagnosis not present

## 2016-09-23 LAB — EXERCISE TOLERANCE TEST
CHL CUP MPHR: 172 {beats}/min
CHL CUP STRESS STAGE 1 DBP: 84 mmHg
CHL CUP STRESS STAGE 3 GRADE: 0.1 %
CHL CUP STRESS STAGE 3 HR: 84 {beats}/min
CHL CUP STRESS STAGE 3 SPEED: 1 mph
CHL CUP STRESS STAGE 4 DBP: 65 mmHg
CHL CUP STRESS STAGE 4 HR: 112 {beats}/min
CHL CUP STRESS STAGE 4 SBP: 156 mmHg
CHL CUP STRESS STAGE 4 SPEED: 1.7 mph
CHL CUP STRESS STAGE 5 DBP: 67 mmHg
CHL CUP STRESS STAGE 5 HR: 126 {beats}/min
CHL CUP STRESS STAGE 5 SBP: 203 mmHg
CHL CUP STRESS STAGE 7 GRADE: 0 %
CHL CUP STRESS STAGE 7 SPEED: 0 mph
CHL CUP STRESS STAGE 8 DBP: 53 mmHg
CHL CUP STRESS STAGE 8 GRADE: 0 %
CHL CUP STRESS STAGE 8 HR: 100 {beats}/min
CHL CUP STRESS STAGE 8 SBP: 133 mmHg
CHL CUP STRESS STAGE 8 SPEED: 0 mph
Estimated workload: 9.3 METS
Exercise duration (min): 7 min
Exercise duration (sec): 30 s
Peak HR: 153 {beats}/min
Percent HR: 88 %
Percent of predicted max HR: 88 %
RPE: 17
Rest HR: 73 {beats}/min
Stage 1 Grade: 0 %
Stage 1 HR: 90 {beats}/min
Stage 1 SBP: 157 mmHg
Stage 1 Speed: 0 mph
Stage 2 Grade: 0 %
Stage 2 HR: 85 {beats}/min
Stage 2 Speed: 1 mph
Stage 4 Grade: 10 %
Stage 5 Grade: 12 %
Stage 5 Speed: 2.5 mph
Stage 6 Grade: 14 %
Stage 6 HR: 153 {beats}/min
Stage 6 Speed: 3.4 mph
Stage 7 DBP: 44 mmHg
Stage 7 HR: 122 {beats}/min
Stage 7 SBP: 208 mmHg

## 2016-09-26 ENCOUNTER — Encounter (HOSPITAL_COMMUNITY): Payer: 59

## 2016-09-26 ENCOUNTER — Other Ambulatory Visit (HOSPITAL_COMMUNITY): Payer: 59

## 2016-09-27 ENCOUNTER — Telehealth: Payer: Self-pay

## 2016-09-27 MED ORDER — FUROSEMIDE 20 MG PO TABS: 20.0000 mg | ORAL_TABLET | Freq: Every day | ORAL | 11 refills | Status: DC

## 2016-09-27 NOTE — Telephone Encounter (Signed)
Notes Recorded by Marzella SchleinAngela M Truitt, CMA on 09/27/2016 at 5:02 PM EST Called patient with results. Patient verbalized understanding and agreed with plan. Appt made with MCr for 12/11 at 10:30a.  Advised patient of dietary changes. Furosemide 20 mg sent to patient's preferred pharmacy electronically.

## 2016-09-27 NOTE — Telephone Encounter (Signed)
-----   Message from Thurmon FairMihai Croitoru, MD sent at 09/23/2016  2:09 PM EDT ----- Please start furosemide 20 mg daily and schedule first available appointment. Echo show mostly good news, but does have a "stiff" heart muscle and signs of fluid overload. Please give him instructions for low sodium diet and have him eat more potassium rich foods.

## 2016-10-03 ENCOUNTER — Other Ambulatory Visit: Payer: Self-pay | Admitting: Family Medicine

## 2016-10-03 DIAGNOSIS — K644 Residual hemorrhoidal skin tags: Secondary | ICD-10-CM

## 2016-10-03 NOTE — Telephone Encounter (Signed)
Okay to refill? 

## 2016-10-06 ENCOUNTER — Ambulatory Visit (INDEPENDENT_AMBULATORY_CARE_PROVIDER_SITE_OTHER): Payer: 59 | Admitting: Internal Medicine

## 2016-10-06 ENCOUNTER — Encounter: Payer: Self-pay | Admitting: Internal Medicine

## 2016-10-06 VITALS — BP 150/70 | HR 86 | Ht 72.0 in | Wt 293.0 lb

## 2016-10-06 DIAGNOSIS — E119 Type 2 diabetes mellitus without complications: Secondary | ICD-10-CM

## 2016-10-06 DIAGNOSIS — Z794 Long term (current) use of insulin: Secondary | ICD-10-CM | POA: Diagnosis not present

## 2016-10-06 NOTE — Patient Instructions (Addendum)
Please continue: - Lantus 50 units at bedtime  Please change Novolog: - 30 units before a smaller meal - 40 units before a regular meal  - 45 units before a larger meal  Move Novolog 15 min before a meal.  Please return in 1.5 months with your sugar log.   PATIENT INSTRUCTIONS FOR TYPE 2 DIABETES:  DIET AND EXERCISE Diet and exercise is an important part of diabetic treatment.  We recommended aerobic exercise in the form of brisk walking (working between 40-60% of maximal aerobic capacity, similar to brisk walking) for 150 minutes per week (such as 30 minutes five days per week) along with 3 times per week performing 'resistance' training (using various gauge rubber tubes with handles) 5-10 exercises involving the major muscle groups (upper body, lower body and core) performing 10-15 repetitions (or near fatigue) each exercise. Start at half the above goal but build slowly to reach the above goals. If limited by weight, joint pain, or disability, we recommend daily walking in a swimming pool with water up to waist to reduce pressure from joints while allow for adequate exercise.    BLOOD GLUCOSES Monitoring your blood glucoses is important for continued management of your diabetes. Please check your blood glucoses 2-4 times a day: fasting, before meals and at bedtime (you can rotate these measurements - e.g. one day check before the 3 meals, the next day check before 2 of the meals and before bedtime, etc.).   HYPOGLYCEMIA (low blood sugar) Hypoglycemia is usually a reaction to not eating, exercising, or taking too much insulin/ other diabetes drugs.  Symptoms include tremors, sweating, hunger, confusion, headache, etc. Treat IMMEDIATELY with 15 grams of Carbs: . 4 glucose tablets .  cup regular juice/soda . 2 tablespoons raisins . 4 teaspoons sugar . 1 tablespoon honey Recheck blood glucose in 15 mins and repeat above if still symptomatic/blood glucose <100.  RECOMMENDATIONS TO  REDUCE YOUR RISK OF DIABETIC COMPLICATIONS: * Take your prescribed MEDICATION(S) * Follow a DIABETIC diet: Complex carbs, fiber rich foods, (monounsaturated and polyunsaturated) fats * AVOID saturated/trans fats, high fat foods, >2,300 mg salt per day. * EXERCISE at least 5 times a week for 30 minutes or preferably daily.  * DO NOT SMOKE OR DRINK more than 1 drink a day. * Check your FEET every day. Do not wear tightfitting shoes. Contact us if you develop an ulcer * See your EYE doctor once a year or more if needed * Get a FLU shot once a year * Get a PNEUMONIA vaccine once before and once after age 49 years  GOALS:  * Your Hemoglobin A1c of <7%  * fasting sugars need to be <130 * after meals sugars need to be <180 (2h after you start eating) * Your Systolic BP should be 140 or lower  * Your Diastolic BP should be 80 or lower  * Your HDL (Good Cholesterol) should be 40 or higher  * Your LDL (Bad Cholesterol) should be 100 or lower. * Your Triglycerides should be 150 or lower  * Your Urine microalbumin (kidney function) should be <30 * Your Body Mass Index should be 25 or lower    Please consider the following ways to cut down carbs and fat and increase fiber and micronutrients in your diet: - substitute whole grain for white bread or pasta - substitute brown rice for white rice - substitute 90-calorie flat bread pieces for slices of bread when possible - substitute sweet potatoes or yams for white  potatoes - substitute humus for margarine - substitute tofu for cheese when possible - substitute almond or rice milk for regular milk (would not drink soy milk daily due to concern for soy estrogen influence on breast cancer risk) - substitute dark chocolate for other sweets when possible - substitute water - can add lemon or orange slices for taste - for diet sodas (artificial sweeteners will trick your body that you can eat sweets without getting calories and will lead you to overeating  and weight gain in the long run) - do not skip breakfast or other meals (this will slow down the metabolism and will result in more weight gain over time)  - can try smoothies made from fruit and almond/rice milk in am instead of regular breakfast - can also try old-fashioned (not instant) oatmeal made with almond/rice milk in am - order the dressing on the side when eating salad at a restaurant (pour less than half of the dressing on the salad) - eat as little meat as possible - can try juicing, but should not forget that juicing will get rid of the fiber, so would alternate with eating raw veg./fruits or drinking smoothies - use as little oil as possible, even when using olive oil - can dress a salad with a mix of balsamic vinegar and lemon juice, for e.g. - use agave nectar, stevia sugar, or regular sugar rather than artificial sweateners - steam or broil/roast veggies  - snack on veggies/fruit/nuts (unsalted, preferably) when possible, rather than processed foods - reduce or eliminate aspartame in diet (it is in diet sodas, chewing gum, etc) Read the labels!  Try to read Dr. Katherina RightNeal Barnard's book: "Program for Reversing Diabetes" for other ideas for healthy eating.

## 2016-10-06 NOTE — Progress Notes (Signed)
Patient ID: Tyler Aguirre, male   DOB: [birth year], 50 y.o.   MRN: [id number]   HPI: Tyler Aguirre is a 50 y.-year-old maleo.-year-old male, referred by his PCP, Dr. [place], for management of DM2, dx in the 1990s, insulin-dependent, uncontrolled, without complications. He previously saw an endocrinologist in [place], would like to switch care to [facility] endocrinology. He moved to [place] from [place] 3 mo ago.  Last hemoglobin A1c was: Lab Results  Component Value Date   HGBA1C 6.6 (H) 08/22/2016   HGBA1C 6.9 (H) 01/22/2010   HGBA1C 6.0 09/02/2009   Pt is on a regimen of: - Lantus 50 units at bedtime - Novolog 40 units 3x a day, before or after meals - misses doses as he is busy at work He had to stop Invokana in the past because of constipation. He stopped Metformin 2/2 GI intolerance.  Pt checks his sugars 1-3x a day and they are: - am: 144-177, 247 - 2h after b'fast: n/c - before lunch: n/c - 2h after lunch: 154-289, 359 - before dinner: 97-203 - 2h after dinner: 181, 323 - bedtime: n/c - nighttime: n/c + lows. Lowest sugar was 39 (woke him up) x1 - very active, small dinner; he has hypoglycemia awareness at 80.  Highest sugar was 360s.  Glucometer: Verio  Pt's meals are: - Breakfast: [employer] biscuit + sausage + eggs + cheese - Lunch: fast food - Dinner: sandwich or a salad - Snacks: crackers, apples He changed his diet 3 mo ago: stopped sodas, sweet tea >> now only water. He also cut down snacks. He also started to exercise.  - no CKD, last BUN/creatinine:  Lab Results  Component Value Date   BUN 15 08/22/2016   BUN 13 03/01/2016   CREATININE 0.68 08/22/2016   CREATININE 0.8 03/01/2016  On Avapro. He has a h/o high ACR >> resolved.  - last set of lipids: Lab Results  Component Value Date   CHOL 214 (H) 08/22/2016   HDL 46.00 08/22/2016   LDLCALC 150 (H) 08/22/2016   LDLDIRECT 147.8 01/22/2010   TRIG 92.0 08/22/2016   CHOLHDL 5 08/22/2016  On Zocor. - last eye exam  was in 05/2016. No DR.  - no numbness and tingling in his feet.  Pt has FH of DM in PGM - type 1.   He had an episode of presyncope when exercising in the park. A recent EKG, exercise stress test, 2D Echo (Dr. Royann Shivers) >> normal >> cleared to start exercise again.   ROS: Constitutional: no weight gain/loss, no fatigue, no subjective hyperthermia/hypothermia Eyes: no blurry vision, no xerophthalmia ENT: no sore throat, no nodules palpated in throat, no dysphagia/odynophagia, no hoarseness Cardiovascular: no CP/SOB/palpitations/leg swelling Respiratory: no cough/SOB Gastrointestinal: no N/V/D/C Musculoskeletal: no muscle/joint aches Skin: no rashes Neurological: no tremors/numbness/tingling/dizziness Psychiatric: no depression/anxiety  Past Medical History:  Diagnosis Date  . ALLERGIC RHINITIS 03/12/2009  . DIABETES MELLITUS, TYPE II 02/26/2007  . DISC DISEASE, LUMBAR 06/16/2009  . HYPERLIPIDEMIA 02/26/2007  . HYPERTENSION 02/26/2007  . NUMBNESS 04/17/2009  . Overweight(278.02) 02/26/2007  . PROSTATITIS, ACUTE 04/17/2009  . TRANSVERSE MYELITIS 05/01/2009   Past Surgical History:  Procedure Laterality Date  . COLONOSCOPY    . DENTAL SURGERY    . POLYPECTOMY     Social History   Social History  . Marital status: Single    Spouse name: N/A  . 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 Smoking status: Never Smoker  . Smokeless tobacco: Never Used  . Alcohol use Yes  . Drug use: No   Current Outpatient Prescriptions on File Prior to Visit  Medication Sig Dispense Refill  . Aurora Lancet Super Thin 30G MISC by Does not apply route. As direceted     . fluticasone (FLONASE) 50 MCG/ACT nasal spray Place 1 spray into both nostrils 2 (two) times daily. 16 g 3  . furosemide (LASIX) 20 MG tablet Take 1 tablet (20 mg total) by mouth daily. 30 tablet 11  . glucose blood test strip 1 each by Other route as needed. Use as  instructed     . insulin aspart (NOVOLOG) 100 UNIT/ML FlexPen Inject 40 Units into the skin 3 (three) times daily before meals.     . insulin glargine (LANTUS) 100 unit/mL SOPN Inject 50 Units into the skin at bedtime.    . irbesartan (AVAPRO) 150 MG tablet Take 1 tablet (150 mg total) by mouth daily. 30 tablet 11  . PROCTOSOL HC 2.5 % rectal cream APPLY RECTALLY TWICE DAILY 28.3 g 0  . simvastatin (ZOCOR) 20 MG tablet Take 20 mg by mouth at bedtime.       No current facility-administered medications on file prior to visit.    Allergies  Allergen Reactions  . Ibuprofen Other (See Comments)    Has to go to the bathroom all the time  . Metformin And Related Diarrhea    Unable to tolerate XL formulation.    Family History  Problem Relation Age of Onset  . Heart disease Mother     valve dz  . Arthritis Mother     RA  . Anemia Mother   . Hypertension Father   . Heart disease Father   . Heart disease Maternal Uncle     CAD  . Diabetes Paternal Grandmother   . Obesity Paternal Grandmother   . Hypertension Paternal Grandmother   . Cancer Paternal Grandfather     lung  . Mental retardation Paternal Grandfather     suicided  . Hypertension Paternal Grandfather   . Diabetes Paternal Grandfather   . Obesity Maternal Grandfather   . Hypertension Maternal Grandfather   . Stroke Paternal Uncle   . Cancer Paternal Uncle    PE: BP (!) 150/70   Pulse 86   Ht 6' (1.829 m)   Wt 293 lb (132.9 kg)   SpO2 97%   BMI 39.74 kg/m  Wt Readings from Last 3 Encounters:  10/06/16 293 lb (132.9 kg)  09/01/16 292 lb (132.5 kg)  08/25/16 297 lb 2 oz (134.8 kg)   Constitutional: obese, in NAD Eyes: PERRLA, EOMI, no exophthalmos ENT: moist mucous membranes, no thyromegaly, no cervical lymphadenopathy Cardiovascular: RRR, No MRG Respiratory: CTA B Gastrointestinal: abdomen soft, NT, ND, BS+ Musculoskeletal: no deformities, strength intact in all 4 Skin: moist, warm, no rashes Neurological:  no tremor with outstretched hands, DTR normal in all 4  ASSESSMENT: 1. DM2, insulin-dependent, uncontrolled, without complications  PLAN:  1. Patient with long-standing, uncontrolled diabetes, on basal-bolus insulin regimen, with frequent delayed meals, missed mealtime insulin doses and misadministration of insulin after rather than before meals "I am very busy". He also checks sugars sporadically, usually after rather than before meals. He had a sever low at night in last 3 mo and we discussed about giving him a more flexible mealtime insulin regimen, with the doses adjusted based on the size of the meals. The  main requirement for DM control is to take all insulin doses, but he also needs to move all of them before, rather than after meals.  - I suggested to:  Patient Instructions  Please continue: - Lantus 50 units at bedtime  Please change Novolog: - 30 units before a smaller meal - 40 units before a regular meal  - 45 units before a larger meal  Move Novolog 15 min before a meal.  Please return in 1.5 months with your sugar log.   - Strongly advised him to start checking sugars at different times of the day - check 3-4 times a day, rotating checks - given sugar log and advised how to fill it and to bring it at next appt  - given foot care handout and explained the principles  - given instructions for hypoglycemia management "15-15 rule"  - advised for yearly eye exams >> he is UTD - Return to clinic in 1.5 mo with sugar log   Carlus Pavlovristina Markas Aldredge, MD PhD [facility]

## 2016-10-26 ENCOUNTER — Encounter: Payer: Self-pay | Admitting: Family Medicine

## 2016-10-31 ENCOUNTER — Encounter: Payer: Self-pay | Admitting: Cardiovascular Disease

## 2016-10-31 ENCOUNTER — Ambulatory Visit (INDEPENDENT_AMBULATORY_CARE_PROVIDER_SITE_OTHER): Payer: 59 | Admitting: Cardiovascular Disease

## 2016-10-31 VITALS — BP 160/90 | HR 69 | Ht 72.0 in | Wt 295.8 lb

## 2016-10-31 DIAGNOSIS — I10 Essential (primary) hypertension: Secondary | ICD-10-CM | POA: Diagnosis not present

## 2016-10-31 DIAGNOSIS — I5032 Chronic diastolic (congestive) heart failure: Secondary | ICD-10-CM

## 2016-10-31 DIAGNOSIS — Z794 Long term (current) use of insulin: Secondary | ICD-10-CM

## 2016-10-31 DIAGNOSIS — E782 Mixed hyperlipidemia: Secondary | ICD-10-CM | POA: Diagnosis not present

## 2016-10-31 DIAGNOSIS — E119 Type 2 diabetes mellitus without complications: Secondary | ICD-10-CM

## 2016-10-31 NOTE — Patient Instructions (Signed)
Your physician has requested that you regularly monitor and record your blood pressure readings at home. Please use the same machine at the same time of day to check your readings and record them to bring to your follow-up visit. IN ONE WEEK please report your blood pressures back to us - please use mychart or call the office to speak with a nurse.  Dr Royann Shiversroitoru recommends that you schedule a follow-up appointment in 6 months. You will receive a reminder letter in the mail two months in advance. If you don't receive a letter, please call our office to schedule the follow-up appointment.  If you need a refill on your cardiac medications before your next appointment, please call your pharmacy.

## 2016-10-31 NOTE — Progress Notes (Signed)
Cardiology Consultation Note    Date:  10/31/2016   ID:  Tyler LedererJohn Mac Aguirre, DOB 12/13/1965, MRN [id number]  PCP:  Betty SwazilandJordan, MD  Cardiologist:   Thurmon FairMihai Maison Kestenbaum, MD  Referred in consultation for: Exertional dyspnea Chief Complaint  Patient presents with  . Follow-up    History of Present Illness:  Tyler Aguirre is a 50 y.o. male with severe obesity, diabetes mellitus, hypertension and hyperlipidemia who presents for evaluation of exertional dyspnea after undergoing the treadmill stress test and an echocardiogram.   He was able to exercise for over 9 minutes on the Bruce protocol without angina or ECG changes. Echocardiogram confirmed expected findings of LVH and diastolic dysfunction showed evidence of elevated left atrial filling pressure. He has started taking a very low dose of loop diuretic and has noticed increased urine output, but has not yet started exercising. He is unsure whether the diuretic has lead to an improvement in his shortness of breath.  He denies any exertional chest discomfort and has not had leg edema, orthopnea or PND. He is taking his statin again. He remains very preoccupied with potential intestinal side effects of any medications, but is tolerating irbesartan without a problem. His blood pressure is high today on the current dose of medication. However, he states that he thinks he threw it up over the last couple of days following an intestinal "flu" that he caught on the road.   He has also had problems with diabetes control and is currently taking insulin. Currently has good glycemic control with a hemoglobin A1c of 6.7%, but historically diabetes control has been mediocre. He has had diabetes since his 1420s and has been severely obese his whole life. At one point he weighed 350 pounds so his current weight is a big improvement. He intends to continue losing weight.    Past Medical History:  Diagnosis Date  . ALLERGIC RHINITIS 03/12/2009  . DIABETES  MELLITUS, TYPE II 02/26/2007  . DISC DISEASE, LUMBAR 06/16/2009  . HYPERLIPIDEMIA 02/26/2007  . HYPERTENSION 02/26/2007  . NUMBNESS 04/17/2009  . Overweight(278.02) 02/26/2007  . PROSTATITIS, ACUTE 04/17/2009  . 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 COLONOSCOPY    . DENTAL SURGERY    . POLYPECTOMY      Current Medications: Outpatient Medications Prior to Visit  Medication Sig Dispense Refill  . Aurora Lancet Super Thin 30G MISC by Does not apply route. As direceted     . fluticasone (FLONASE) 50 MCG/ACT nasal spray Place 1 spray into both nostrils 2 (two) times daily. 16 g 3  . furosemide (LASIX) 20 MG tablet Take 1 tablet (20 mg total) by mouth daily. 30 tablet 11  . glucose blood test strip 1 each by Other route as needed. Use as instructed     . insulin aspart (NOVOLOG) 100 UNIT/ML FlexPen Inject 40 Units into the skin 3 (three) times daily before meals.     . insulin glargine (LANTUS) 100 unit/mL SOPN Inject 50 Units into the skin at bedtime.    . irbesartan (AVAPRO) 150 MG tablet Take 1 tablet (150 mg total) by mouth daily. 30 tablet 11  . PROCTOSOL HC 2.5 % rectal cream APPLY RECTALLY TWICE DAILY 28.3 g 0  . simvastatin (ZOCOR) 20 MG tablet Take 20 mg by mouth at bedtime.      Marland Kitchen. amoxicillin (AMOXIL) 250 MG capsule Take 250 mg by mouth 2 (two) times daily.     No  facility-administered medications prior to visit.      Allergies:   Ibuprofen and Metformin and related   Social History   Social History  . Marital status: Single    Spouse name: N/A  . Number of children: N/A  . Years of education: N/A   Social History Main Topics  . Smoking status: Never Smoker  . Smokeless tobacco: Never Used  . Alcohol use Yes  . Drug use: No  . Sexual activity: Yes    Birth control/ protection: Condom   Other Topics Concern  . None   Social History Narrative  . None     Family History:  The patient's family history includes Anemia in  his mother; Arthritis in his mother; Cancer in his paternal grandfather and paternal uncle; Diabetes in his paternal grandfather and paternal grandmother; Heart disease in his father, maternal uncle, and mother; Hypertension in his father, maternal grandfather, paternal grandfather, and paternal grandmother; Mental retardation in his paternal grandfather; Obesity in his maternal grandfather and paternal grandmother; Stroke in his paternal uncle.   ROS:   Please see the history of present illness.    ROS All other systems reviewed and are negative.   PHYSICAL EXAM:   VS:  BP (!) 160/90 (BP Location: Right Arm, Patient Position: Sitting, Cuff Size: Normal)   Pulse 69   Ht 6' (1.829 m)   Wt 295 lb 12.8 oz (134.2 kg)   SpO2 98%   BMI 40.12 kg/m    GEN: Well nourished, well developed, in no acute distress  HEENT: normal  Neck: no JVD, carotid bruits, or masses Cardiac: RRR; no murmurs  or rubs, has a distinct S4 gallop,no edema  Respiratory:  clear to auscultation bilaterally, normal work of breathing GI: soft, nontender, nondistended, + BS MS: no deformity or atrophy  Skin: warm and dry, no rash Neuro:  Alert and Oriented x 3, Strength and sensation are intact Psych: euthymic mood, full affect  Wt Readings from Last 3 Encounters:  10/31/16 295 lb 12.8 oz (134.2 kg)  10/06/16 293 lb (132.9 kg)  09/01/16 292 lb (132.5 kg)      Studies/Labs Reviewed:   EKG:  EKG is ordered today.  The ekg ordered today demonstrates Normal sinus rhythm, QTC 424 ms  Recent Labs: 08/22/2016: ALT 47; BUN 15; Creatinine, Ser 0.68; Hemoglobin 13.5; Platelets 117.0; Potassium 4.2; Sodium 139; TSH 2.92   Lipid Panel    Component Value Date/Time   CHOL 214 (H) 08/22/2016 0835   TRIG 92.0 08/22/2016 0835   TRIG 200 (H) 10/17/2006 1033   HDL 46.00 08/22/2016 0835   CHOLHDL 5 08/22/2016 0835   VLDL 18.4 08/22/2016 0835   LDLCALC 150 (H) 08/22/2016 0835   LDLDIRECT 147.8 01/22/2010 0851    Additional  studies/ records that were reviewed today include:  Notes from Dr. Betty SwazilandJordan    ASSESSMENT:    1. Essential hypertension   2. Chronic diastolic heart failure (HCC)   3. Type 2 diabetes mellitus with insulin therapy (HCC)   4. Mixed hyperlipidemia   5. Severe obesity (BMI 35.0-39.9) with comorbidity (HCC)      PLAN:  In order of problems listed above:  1. CHF: He has clear-cut evidence of diastolic dysfunction and elevated filling pressure by echo. So far tolerating irbesartan and furosemide without GI side effects which are his biggest concern. I suspect he will need a higher dose of irbesartan. 2. HTN: He seems to be tolerating the angiotensin receptor blocker which is  good, since this medication will provide both LVH regression and nephro protection. He will send [place] some blood pressure records via [email or web address]  3. DM: Requiring very high doses of insulin, consistent with severe insulin resistance. He understands the need for weight loss and regular physical exercise. He has started to improve his diet. Recently glycemic control is much better and he has lost some weight. He is encouraged to keep pursuing this healthier lifestyle. 4. HLP:  Restarting his simvastatin. Obviously there are other potent statins that would be beneficial if the simvastatin is either poorly tolerated or does not help him  reach target LDL less than 100. Also reviewed healthy diet in the setting of mixed hyperlipidemia and diabetes mellitus. 5. Obesity: His BMI is approximately 40. Despite this, his current weight is actually a big improvement from a peak weight of 350 pounds. He is congratulated for his success in weight loss so far and encouraged to keep up sustained efforts at weight loss  Medication Adjustments/Labs and Tests Ordered: Current medicines are reviewed at length with the patient today.  Concerns regarding medicines are outlined above.  Medication changes, Labs and Tests ordered today are listed  in the Patient Instructions below. Patient Instructions  Your physician has requested that you regularly monitor and record your blood pressure readings at home. Please use the same machine at the same time of day to check your readings and record them to bring to your follow-up visit. IN ONE WEEK please report your blood pressures back to [place] - please use mychart or call the office to speak with a nurse.  Dr Royann Shivers recommends that you schedule a follow-up appointment in 6 months. You will receive a reminder letter in the mail two months in advance. If you don't receive a letter, please call our office to schedule the follow-up appointment.  If you need a refill on your cardiac medications before your next appointment, please call your pharmacy.    Signed, Thurmon Fair, MD  10/31/2016 2:03 PM    [facility] [address] [place], [place], [place]  [address] Phone: [phone]; Fax: [phone]

## 2016-11-28 ENCOUNTER — Other Ambulatory Visit: Payer: Self-pay

## 2016-11-28 ENCOUNTER — Encounter: Payer: Self-pay | Admitting: Internal Medicine

## 2016-11-28 ENCOUNTER — Ambulatory Visit (INDEPENDENT_AMBULATORY_CARE_PROVIDER_SITE_OTHER): Payer: 59 | Admitting: Internal Medicine

## 2016-11-28 VITALS — BP 142/90 | HR 76 | Ht 72.0 in | Wt 297.0 lb

## 2016-11-28 DIAGNOSIS — Z794 Long term (current) use of insulin: Secondary | ICD-10-CM

## 2016-11-28 DIAGNOSIS — E119 Type 2 diabetes mellitus without complications: Secondary | ICD-10-CM

## 2016-11-28 LAB — POCT GLYCOSYLATED HEMOGLOBIN (HGB A1C): HEMOGLOBIN A1C: 7

## 2016-11-28 MED ORDER — INSULIN GLARGINE 100 UNITS/ML SOLOSTAR PEN: 50.0000 [IU] | PEN_INJECTOR | Freq: Every day | SUBCUTANEOUS | 0 refills | Status: DC

## 2016-11-28 MED ORDER — INSULIN ASPART 100 UNIT/ML FLEXPEN: 40.0000 [IU] | PEN_INJECTOR | Freq: Three times a day (TID) | SUBCUTANEOUS | 0 refills | Status: DC

## 2016-11-28 MED ORDER — DULAGLUTIDE 0.75 MG/0.5ML ~~LOC~~ SOAJ: SUBCUTANEOUS | 1 refills | Status: DC

## 2016-11-28 NOTE — Patient Instructions (Addendum)
Please continue: - Lantus 50 units at bedtime - Novolog 10-15 min before a meal. - 30 units before a smaller meal - 40 units before a regular meal  - 50 units before a larger meal  Please start Trulicity 0.75 mg weekly, but let me know in 3 weeks if we need to increase the dose further.  Please return in 3 months with your sugar log.

## 2016-11-28 NOTE — Progress Notes (Signed)
Patient ID: Tyler Aguirre, male   DOB: November 03, 1966, 51 y.o.   MRN: [id number]   HPI: Tyler Aguirre is a 51 y.o.-year-old male, returning for follow-up for DM2, dx in the 1990s, insulin-dependent, uncontrolled, without complications. He previously saw an endocrinologist in [place], but wanted to switch care to [facility] endocrinology after he moved to [facility] summer 2017.  He had dietary indiscretions over the Holidays >> sugars higher.  Last hemoglobin A1c was: Lab Results  Component Value Date   HGBA1C 6.6 (H) 08/22/2016   HGBA1C 6.9 (H) 01/22/2010   HGBA1C 6.0 09/02/2009   Pt was on a regimen of: - Lantus 50 units at bedtime - Novolog 40 units 3x a day, before or after meals - misses doses as he is busy at work He had to stop Invokana in the past because of constipation. He stopped Metformin 2/2 GI intolerance.  At last visit, we change the regimen as follows: - Lantus 50 units at bedtime - Novolog 10-15 min before a meal. - 30 units before a smaller meal - 40 units before a regular meal  - 50 units before a larger meal  Pt checks his sugars 1-3x a day and they are still high: - am: 144-177, 247 >> 116, 129-197 - 2h after b'fast: n/c - before lunch: n/c >> 242 - 2h after lunch: 154-289, 359 >> 350 - before dinner: 97-203 >> 204-297 - 2h after dinner: 181, 323 >> 327, 333 - bedtime: n/c - nighttime: n/c >> 60 at 12 am + lows. Lowest sugar was 39 (woke him up) x1 - very active, small dinner >> 60 at 12 am; he has hypoglycemia awareness at 80.  Highest sugar was 360s >> 333.  Glucometer: Verio  He changed his diet over the summer : stopped sodas, sweet tea >> now only water.  - no CKD, last BUN/creatinine:  Lab Results  Component Value Date   BUN 15 08/22/2016   BUN 13 03/01/2016   CREATININE 0.68 08/22/2016   CREATININE 0.8 03/01/2016  On Avapro. He has a h/o high ACR >> resolved.  - last set of lipids: Lab Results  Component Value Date   CHOL 214 (H)  08/22/2016   HDL 46.00 08/22/2016   LDLCALC 150 (H) 08/22/2016   LDLDIRECT 147.8 01/22/2010   TRIG 92.0 08/22/2016   CHOLHDL 5 08/22/2016  On Zocor. - last eye exam was in 05/2016. No DR.  - no numbness and tingling in his feet.  Pt has FH of DM in PGM - type 1.   He had an episode of presyncope when exercising in the park. A recent EKG, exercise stress test, 2D Echo (Dr. Sallyanne Kuster) >> normal >> cleared to start exercise again.   ROS: Constitutional: + weight gain, no fatigue, no subjective hyperthermia/hypothermia Eyes: no blurry vision, no xerophthalmia ENT: no sore throat, no nodules palpated in throat, no dysphagia/odynophagia, no hoarseness Cardiovascular: no CP/SOB/palpitations/leg swelling Respiratory: no cough/SOB Gastrointestinal: no N/V/D/C Musculoskeletal: no muscle/joint aches Skin: no rashes Neurological: no tremors/numbness/tingling/dizziness  I reviewed pt's medications, allergies, PMH, social hx, family hx, and changes were documented in the history of present illness. Otherwise, unchanged from my initial visit note.  Past Medical History:  Diagnosis Date  . ALLERGIC RHINITIS 03/12/2009  . DIABETES MELLITUS, TYPE II 02/26/2007  . 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 HYPERLIPIDEMIA 02/26/2007  . HYPERTENSION 02/26/2007  . NUMBNESS 04/17/2009  . Overweight(278.02) 02/26/2007  . PROSTATITIS, ACUTE 04/17/2009  . TRANSVERSE MYELITIS 05/01/2009   Past  Surgical History:  Procedure Laterality Date  . COLONOSCOPY    . DENTAL SURGERY    . POLYPECTOMY     Social History   Social History  . Marital status: Single    Spouse name: N/A  . 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 Smoking status: Never Smoker  . Smokeless tobacco: Never Used  . Alcohol use Yes  . Drug use: No   Current Outpatient Prescriptions on File Prior to Visit  Medication Sig Dispense Refill  . Aurora Lancet Super Thin 30G MISC by  Does not apply route. As direceted     . Chlorphen-PE-Acetaminophen (CORICIDIN D COLD/FLU/SINUS PO) Take 2 tablets by mouth daily.    . fluticasone (FLONASE) 50 MCG/ACT nasal spray Place 1 spray into both nostrils 2 (two) times daily. 16 g 3  . furosemide (LASIX) 20 MG tablet Take 1 tablet (20 mg total) by mouth daily. 30 tablet 11  . glucose blood test strip 1 each by Other route as needed. Use as instructed     . insulin aspart (NOVOLOG) 100 UNIT/ML FlexPen Inject 40 Units into the skin 3 (three) times daily before meals.     . insulin glargine (LANTUS) 100 unit/mL SOPN Inject 50 Units into the skin at bedtime.    . irbesartan (AVAPRO) 150 MG tablet Take 1 tablet (150 mg total) by mouth daily. 30 tablet 11  . PROCTOSOL HC 2.5 % rectal cream APPLY RECTALLY TWICE DAILY 28.3 g 0   No current facility-administered medications on file prior to visit.    Allergies  Allergen Reactions  . Ibuprofen Other (See Comments)    Has to go to the bathroom all the time  . Metformin And Related Diarrhea    Unable to tolerate XL formulation.    Family History  Problem Relation Age of Onset  . Heart disease Mother     valve dz  . Arthritis Mother     RA  . Anemia Mother   . Hypertension Father   . Heart disease Father   . Heart disease Maternal Uncle     CAD  . Diabetes Paternal Grandmother   . Obesity Paternal Grandmother   . Hypertension Paternal Grandmother   . Cancer Paternal Grandfather     lung  . Mental retardation Paternal Grandfather     suicided  . Hypertension Paternal Grandfather   . Diabetes Paternal Grandfather   . Obesity Maternal Grandfather   . Hypertension Maternal Grandfather   . Stroke Paternal Uncle   . Cancer Paternal Uncle    PE: BP (!) 142/90 (BP Location: Left Arm, Patient Position: Sitting)   Pulse 76   Ht 6' (1.829 m)   Wt 297 lb (134.7 kg)   SpO2 98%   BMI 40.28 kg/m  Wt Readings from Last 3 Encounters:  11/28/16 297 lb (134.7 kg)  10/31/16 295 lb 12.8  oz (134.2 kg)  10/06/16 293 lb (132.9 kg)   Constitutional: obese, in NAD Eyes: PERRLA, EOMI, no exophthalmos ENT: moist mucous membranes, no thyromegaly, no cervical lymphadenopathy Cardiovascular: RRR, No MRG Respiratory: CTA B Gastrointestinal: abdomen soft, NT, ND, BS+ Musculoskeletal: no deformities, strength intact in all 4 Skin: moist, warm, no rashes Neurological: no tremor with outstretched hands, DTR normal in all 4  ASSESSMENT: 1. DM2, insulin-dependent, uncontrolled, without complications  PLAN:  1. Patient with long-standing, uncontrolled diabetes, on basal-bolus insulin regimen,  with previously frequent delayed meals, missed mealtime insulin doses and misadministration of insulin after rather than before meals "I am very busy". He was also checking sugars sporadically, usually after rather than before meals.  - At last visit, we discussed about the importance of checking sugars and taking the insulin before every meal, and I also gave him a  more flexible mealtime insulin regimen, with the doses adjusted based on the size of the meals.We also discussed about the importance of improving his meals. - Sugars are still high especially in second half of the day >> discussed about dietary changes and will also start Trulicity  - CXK4Y today 7.0% (higher) - I suggested to:  Patient Instructions  Please continue: - Lantus 50 units at bedtime - Novolog 10-15 min before a meal. - 30 units before a smaller meal - 40 units before a regular meal  - 50 units before a larger meal  Please start Trulicity 1.85 mg weekly, but let me know in 3 weeks if we need to increase the dose further.  Please return in 3 months with your sugar log.   - Continue checking sugars at different times of the day - check 3-4 times a day, rotating checks - advised for yearly eye exams >> he is UTD - Return to clinic in 3 mo with sugar log   Philemon Kingdom, MD PhD [facility]

## 2016-12-01 ENCOUNTER — Encounter: Payer: Self-pay | Admitting: Internal Medicine

## 2016-12-01 ENCOUNTER — Other Ambulatory Visit: Payer: Self-pay

## 2016-12-01 ENCOUNTER — Encounter: Payer: Self-pay | Admitting: Family Medicine

## 2016-12-01 ENCOUNTER — Ambulatory Visit (INDEPENDENT_AMBULATORY_CARE_PROVIDER_SITE_OTHER): Payer: 59 | Admitting: Family Medicine

## 2016-12-01 VITALS — BP 146/76 | HR 80 | Temp 97.9°F | Resp 12 | Wt 300.4 lb

## 2016-12-01 DIAGNOSIS — M7542 Impingement syndrome of left shoulder: Secondary | ICD-10-CM

## 2016-12-01 DIAGNOSIS — I10 Essential (primary) hypertension: Secondary | ICD-10-CM | POA: Diagnosis not present

## 2016-12-01 DIAGNOSIS — L608 Other nail disorders: Secondary | ICD-10-CM | POA: Diagnosis not present

## 2016-12-01 DIAGNOSIS — J309 Allergic rhinitis, unspecified: Secondary | ICD-10-CM

## 2016-12-01 MED ORDER — AZELASTINE HCL 0.1 % NA SOLN: 2.0000 | Freq: Two times a day (BID) | NASAL | 6 refills | Status: DC

## 2016-12-01 NOTE — Patient Instructions (Signed)
A few things to remember from today's visit:   Impingement syndrome of left shoulder - Plan: DG Shoulder Left, Ambulatory referral to Physical Therapy  Essential hypertension  Allergic rhinitis, unspecified chronicity, unspecified seasonality, unspecified trigger - Plan: azelastine (ASTELIN) 0.1 % nasal spray    Please be sure medication list is accurate. If a new problem present, please set up appointment sooner than planned today.

## 2016-12-01 NOTE — Progress Notes (Signed)
HPI:   Tyler Aguirre is a 51 y.o. male, who is here today to follow on some of his chronic medical problems, he also has a few new concerns today.  Since his last visit, 08/25/16, he has followed with endocrinologists and cardiologist.   HTN: He is now on Avapro 150 mg daily, which he started about 2 weeks ago and so far no side effects. BP's at home 140's/90's.  He has not noted unusual headache, visual changes, exertional chest pain, dyspnea,  focal weakness, or edema.    Lab Results  Component Value Date   CREATININE 0.68 08/22/2016   BUN 15 08/22/2016   NA 139 08/22/2016   K 4.2 08/22/2016   CL 105 08/22/2016   CO2 28 08/22/2016    Hyperlipidemia:  Currently on non pharmacologic treatment. He stopped stain medication because side effects.   Lab Results  Component Value Date   CHOL 214 (H) 08/22/2016   HDL 46.00 08/22/2016   LDLCALC 150 (H) 08/22/2016   LDLDIRECT 147.8 01/22/2010   TRIG 92.0 08/22/2016   CHOLHDL 5 08/22/2016     Concerns today: Left shoulder pain "is killing me" , toe nail changes, and nasal congestion x 1-2 weeks.  - Left shoulder pain: According to patient he has had intermittent left shoulder pain for a while, she denies Hx of shoulder trauma. Pain seems to be getting worse for the past 2-3 weeks after he was trying to reach something from back seat while he was in front passenger seat. Pain is severe, not as bad now. Right handed.  Pain is exacerbated by overhead activities and with ROM that sometimes is limted, alleviated by rest. Not taking OTC medication for pain. He denies any cervical pain, rash, or numbness/tingling.   Respiratory symptoms:  Productive cough with clear sputum usually in the morning for the past 1-2 weeks.  He denies fever, chill, or myalgias. Moderate nasal congestion, rhinorrhea (occasionally blood mixed with mucus), and post nasal drainage worse at night when lying down. Intermittent earache  and pruritus, denies hearing loss. Denies associated chest pain, dyspnea, or wheezing.   No Hx of recent travel. No sick contact. No known insect bite. + Hx of allergies, she uses Flonase nasal spray as needed, has not used it for a while.  -Toe nail changes noted about 2 weeks ago, no Hx of trauma, no periungual edema or erythema, and no pain.     Review of Systems  Constitutional: Negative for activity change, appetite change, fatigue, fever and unexpected weight change.  HENT: Positive for congestion, ear pain, nosebleeds, postnasal drip and rhinorrhea. Negative for hearing loss, mouth sores, sore throat, trouble swallowing and voice change.   Eyes: Negative for redness and visual disturbance.  Respiratory: Positive for cough. Negative for shortness of breath and wheezing.   Cardiovascular: Negative for chest pain, palpitations and leg swelling.  Gastrointestinal: Negative for abdominal pain, nausea and vomiting.  Genitourinary: Negative for decreased urine volume, dysuria and hematuria.  Musculoskeletal: Positive for arthralgias. Negative for joint swelling and myalgias.  Skin: Negative for rash.  Allergic/Immunologic: Positive for environmental allergies.  Neurological: Negative for dizziness, syncope, weakness, numbness and headaches.  Psychiatric/Behavioral: Negative for confusion. The patient is nervous/anxious.       Current Outpatient Prescriptions on File Prior to Visit  Medication Sig Dispense Refill  . Aurora Lancet Super Thin 30G MISC by Does not apply route. As direceted     . Chlorphen-PE-Acetaminophen (CORICIDIN D COLD/FLU/SINUS PO)  Take 2 tablets by mouth daily.    . Dulaglutide (TRULICITY) 9.41 DE/0.8XK SOPN Inject under skin 0.75 mg weekly 4 pen 1  . fluticasone (FLONASE) 50 MCG/ACT nasal spray Place 1 spray into both nostrils 2 (two) times daily. 16 g 3  . furosemide (LASIX) 20 MG tablet Take 1 tablet (20 mg total) by mouth daily. 30 tablet 11  . glucose  blood test strip 1 each by Other route as needed. Use as instructed     . insulin aspart (NOVOLOG) 100 UNIT/ML FlexPen Inject 40-50 Units into the skin 3 (three) times daily before meals. 45 pen 0  . insulin glargine (LANTUS) 100 unit/mL SOPN Inject 0.5 mLs (50 Units total) into the skin at bedtime. 15 pen 0  . irbesartan (AVAPRO) 150 MG tablet Take 1 tablet (150 mg total) by mouth daily. 30 tablet 11  . PROCTOSOL HC 2.5 % rectal cream APPLY RECTALLY TWICE DAILY 28.3 g 0   No current facility-administered medications on file prior to visit.      Past Medical History:  Diagnosis Date  . ALLERGIC RHINITIS 03/12/2009  . DIABETES MELLITUS, TYPE II 02/26/2007  . [place] DISEASE, LUMBAR 06/16/2009  . HYPERLIPIDEMIA 02/26/2007  . HYPERTENSION 02/26/2007  . NUMBNESS 04/17/2009  . Overweight(278.02) 02/26/2007  . PROSTATITIS, ACUTE 04/17/2009  . TRANSVERSE MYELITIS 05/01/2009   Allergies  Allergen Reactions  . Ibuprofen Other (See Comments)    Has to go to the bathroom all the time  . Metformin And Related Diarrhea    Unable to tolerate XL formulation.     Social History   Social History  . Marital status: Single    Spouse name: N/A  . Number of children: N/A  . Years of education: N/A   Social History Main Topics  . Smoking status: Never Smoker  . Smokeless tobacco: Never Used  . Alcohol use Yes  . Drug use: No  . Sexual activity: Yes    Birth control/ protection: Condom   Other Topics Concern  . None   Social History Narrative  . None    Vitals:   12/01/16 1603  BP: (!) 146/76  Pulse: 80  Resp: 12  Temp: 97.9 F (36.6 C)   O2 sat 97% at RA.  Body mass index is 40.74 kg/m.   Physical Exam  Nursing note and vitals reviewed. Constitutional: He is oriented to person, place, and time. He appears well-developed. He does not appear ill. No distress.  HENT:  Head: Atraumatic.  Right Ear: External ear and ear canal normal. Tympanic membrane is not erythematous and not bulging.  A middle ear effusion is present.  Left Ear: Tympanic membrane, external ear and ear canal normal.  Nose: Rhinorrhea present. No epistaxis. Right sinus exhibits no maxillary sinus tenderness and no frontal sinus tenderness. Left sinus exhibits no maxillary sinus tenderness and no frontal sinus tenderness.  Mouth/Throat: Oropharynx is clear and moist and mucous membranes are normal.  Nasal voice. Hypertrophic turbinates.  Eyes: Conjunctivae and EOM are normal.  Cardiovascular: Normal rate and regular rhythm.   No murmur heard. Pulses:      Dorsalis pedis pulses are 2+ on the right side, and 2+ on the left side.  Respiratory: Effort normal and breath sounds normal. No stridor. No respiratory distress.  Musculoskeletal: He exhibits no edema.  No tenderness upon palpation of left shoulder, no edema or erythema. Luan Pulling' test negative Empty can supraspinatus test pos Lift-Off subscapularis test positive Cross-Body adduction test: Neg External Rotation and  internal rotation with resistance do no elicit pain.  Lymphadenopathy:       Head (right side): No submandibular adenopathy present.       Head (left side): No submandibular adenopathy present.    He has no cervical adenopathy.  Neurological: He is alert and oriented to person, place, and time. He has normal strength. Coordination and gait normal.  Skin: Skin is warm. No rash noted. No erythema.  3rd left toenail thick and irregular. No periungual erythema or edema.  Psychiatric: His speech is normal. His mood appears anxious.  Well groomed, good eye contact.      ASSESSMENT AND PLAN:     Zadin was seen today for follow-up.  Diagnoses and all orders for this visit:  Impingement syndrome of left shoulder  We discussed possible causes of shoulder pain: OA, partial tear or tendinitis of rotator cuff among some. PT recommended. Because chronic pain and no prior work-up imaging was ordered. F/U as needed.  -     DG Shoulder  Left; Future -     Ambulatory referral to Physical Therapy  Essential hypertension  Not well controlled. Possible complications of elevated BP discussed. He is not interested in adjusting medication. He prefers to continue following with cardiologists for HTN and HLD.   Allergic rhinitis, unspecified chronicity, unspecified seasonality, unspecified trigger  Flonase nasal s[ray daily, some side effects discussed. Add Astelin nasal spray. Nasal saline. F/U as needed.  -     azelastine (ASTELIN) 0.1 % nasal spray; Place 2 sprays into both nostrils 2 (two) times daily. Use in each nostril as directed  Toenail deformity  ? Onychomycosis Some side effects of oral antimycotic medications discussed. OTC antifungal medication and/or Vick Vapor rub at bedtime and soaks in vineger with water. F/U as needed.   He will continue following with endocrinologists for his DM II and cardiologists for HTN and HLD. I will see him back in a year.     -Mr. [name] was advised to return sooner than planned today if new concerns arise.       Ellean Firman G. Martinique, MD  [facility]. [place] office.

## 2016-12-02 ENCOUNTER — Other Ambulatory Visit: Payer: Self-pay

## 2016-12-02 MED ORDER — GLUCOSE BLOOD VI STRP: ORAL_STRIP | 3 refills | Status: AC

## 2016-12-09 ENCOUNTER — Telehealth: Payer: Self-pay

## 2016-12-09 ENCOUNTER — Ambulatory Visit (INDEPENDENT_AMBULATORY_CARE_PROVIDER_SITE_OTHER)
Admission: RE | Admit: 2016-12-09 | Discharge: 2016-12-09 | Disposition: A | Discharge status: Home / Self Care | Payer: 59 | Source: Ambulatory Visit | Attending: Family Medicine | Admitting: Family Medicine

## 2016-12-09 DIAGNOSIS — M7542 Impingement syndrome of left shoulder: Secondary | ICD-10-CM

## 2016-12-09 NOTE — Telephone Encounter (Signed)
What is the status of the rx PA for the novolog and the lantus please call pt and let him know

## 2016-12-09 NOTE — Telephone Encounter (Signed)
Called and notified patient we were working on his PA for Novolog, and Lantus. Patient had no questions at this time, will monitor covermymeds for a decision.

## 2016-12-11 ENCOUNTER — Encounter: Payer: Self-pay | Admitting: Family Medicine

## 2016-12-13 ENCOUNTER — Other Ambulatory Visit: Payer: Self-pay

## 2016-12-13 MED ORDER — INSULIN LISPRO 100 UNIT/ML (KWIKPEN): PEN_INJECTOR | SUBCUTANEOUS | 0 refills | Status: DC

## 2016-12-14 ENCOUNTER — Telehealth: Payer: Self-pay

## 2016-12-14 NOTE — Telephone Encounter (Signed)
Answering questions for the PA, it will request that he try Levemir or Basaglar. I can not find on history of med list. Please advise. Thank you!

## 2016-12-14 NOTE — Telephone Encounter (Signed)
Patient called on PA for Novolog and Lantus, I did hear back from the Novolog, and it was denied; the patient was not happy about this, I need to know what to do next. I have no heard back from the Lantus so I am submitting it again. Please advise. Thank you!

## 2016-12-15 ENCOUNTER — Other Ambulatory Visit: Payer: Self-pay

## 2016-12-15 MED ORDER — BASAGLAR KWIKPEN 100 UNIT/ML ~~LOC~~ SOPN: PEN_INJECTOR | SUBCUTANEOUS | 1 refills | Status: DC

## 2016-12-15 NOTE — Telephone Encounter (Signed)
Called and LVM for patient advising of changes that were made, and which medications were sent in. Gave call back number if he had questions.

## 2016-12-15 NOTE — Telephone Encounter (Signed)
This change the Basaglar instead of Lantus and Humalog instead NovoLog. Same doses. We can use this type of insulin is interchangeably, without any problems, please reassure him.

## 2016-12-15 NOTE — Telephone Encounter (Signed)
Called and left message explaining that we had switched Lantus to Basaglar, and Novolog to Humalog. Advised patient hey were interchangable and should have no issues. Gave call back number if any issues.

## 2016-12-27 ENCOUNTER — Encounter: Payer: Self-pay | Admitting: Physical Therapy

## 2016-12-27 ENCOUNTER — Ambulatory Visit: Payer: 59 | Attending: Family Medicine | Admitting: Physical Therapy

## 2016-12-27 DIAGNOSIS — M25512 Pain in left shoulder: Secondary | ICD-10-CM | POA: Diagnosis present

## 2016-12-27 DIAGNOSIS — M6281 Muscle weakness (generalized): Secondary | ICD-10-CM | POA: Insufficient documentation

## 2016-12-27 DIAGNOSIS — M25612 Stiffness of left shoulder, not elsewhere classified: Secondary | ICD-10-CM

## 2016-12-27 DIAGNOSIS — G8929 Other chronic pain: Secondary | ICD-10-CM | POA: Diagnosis present

## 2016-12-27 NOTE — Therapy (Signed)
[facility] [address] [place], [place], [address] Phone: [phone]   Fax:  [phone]  Physical Therapy Evaluation  Patient Details  Name: Tyler Aguirre MRN: [id number] Date of Birth: 07-08-1966 Referring Provider: Dr. Betty Martinique  Encounter Date: 12/27/2016      PT End of Session - 12/27/16 1522    Visit Number 1   Date for PT Re-Evaluation 02/21/17   PT Start Time 1451   PT Stop Time 1524   PT Time Calculation (min) 33 min   Activity Tolerance Patient tolerated treatment well   Behavior During Therapy [facility] for tasks assessed/performed      Past Medical History:  Diagnosis Date  . ALLERGIC RHINITIS 03/12/2009  . DIABETES MELLITUS, TYPE II 02/26/2007  . [place] DISEASE, LUMBAR 06/16/2009  . HYPERLIPIDEMIA 02/26/2007  . HYPERTENSION 02/26/2007  . NUMBNESS 04/17/2009  . Overweight(278.02) 02/26/2007  . PROSTATITIS, ACUTE 04/17/2009  . 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 COLONOSCOPY    . DENTAL SURGERY    . POLYPECTOMY      There were no vitals filed for this visit.       Subjective Assessment - 12/27/16 1456    Subjective Patient reports his left shoulder hurts for 1 year.  Unable to lift past shoulder without pain.    Currently in Pain? 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 Betty Martinique   Onset Date/Surgical Date 12/23/15   Hand  Dominance Right   Prior Therapy none     Precautions   Precautions None     Restrictions   Weight Bearing Restrictions No     Balance Screen   Has the patient fallen in the past 6 months No   Has the patient had a decrease in activity level because of a fear of falling?  No   Is the patient reluctant to leave their home because of a fear of falling?  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     Prior Function   Level of Independence Independent with basic ADLs   Vocation Full time employment   Vocation Requirements computer work, run Ecologist   Overall Cognitive Status Within Functional Limits for tasks assessed     Observation/Other Assessments   Focus on Therapeutic Outcomes (FOTO)  46% limitation  goal is 30% limitation     Posture/Postural Control   Posture/Postural Control No significant limitations     ROM / Strength   AROM / PROM / Strength AROM;PROM;Strength     AROM   Left Shoulder Flexion 104 Degrees   Left Shoulder ABduction 115 Degrees   Left Shoulder Internal Rotation 65 Degrees  reach to sacrum behind back   Left Shoulder External  Rotation 65 Degrees     PROM   Left Shoulder Flexion 152 Degrees   Left Shoulder ABduction 120 Degrees   Left Shoulder Internal Rotation 70 Degrees   Left Shoulder External Rotation 70 Degrees     Strength   Right Shoulder Flexion --   Right Shoulder ABduction --   Right Shoulder Internal Rotation --   Right Shoulder External Rotation --   Left Shoulder Flexion 3-/5   Left Shoulder Extension 4-/5   Left Shoulder ABduction 2+/5   Left Shoulder Internal Rotation 3+/5   Left Shoulder External Rotation 2+/5     Palpation   Palpation comment left RTC insertion; left A-C joint; left supraspinatus     Transfers   Transfers Not assessed     Ambulation/Gait   Ambulation/Gait No                   [facility] Adult PT Treatment/Exercise - 12/27/16 0001      Modalities    Modalities Iontophoresis     Iontophoresis   Type of Iontophoresis Dexamethasone   Location left A-C joint   Dose 1 ml   Time 6 hour patch                PT Education - 12/27/16 1516    Education provided Yes   Education Details information on ionto patch   Person(s) Educated Patient   Methods Explanation;Handout   Comprehension Verbalized understanding          PT Short Term Goals - 12/27/16 1530      PT SHORT TERM GOAL #1   Title independent with initial HEP   Time 4   Period Weeks   Status New     PT SHORT TERM GOAL #2   Title raise left arm overhead with pain decreased >/= 25%   Time 4   Period Weeks   Status New     PT SHORT TERM GOAL #3   Title raise left arm out to side to put a coat on with pain decreased >/= 25%   Time 4   Period Weeks   Status New     PT SHORT TERM GOAL #4   Title pain with laying on left shoulder decreased >/= 25%   Time 4   Period Weeks   Status New           PT Long Term Goals - 12/27/16 1517      PT LONG TERM GOAL #1   Title independent with HEP   Time 8   Period Weeks   Status New     PT LONG TERM GOAL #2   Title sleep through the night and not wake up due to shoulder pain   Time 8   Period Weeks   Status New     PT LONG TERM GOAL #3   Title raise left arm overhead to work on cables for work with pain decreased >/= 75% and left shoulder fleixon and abduction AROM >/= 140 degrees   Time 8   Period Weeks   Status New     PT LONG TERM GOAL #4   Title reach behind to get and item with left arm due to left shoulder strength >/= 4/5   Time 8   Period Weeks   Status New     PT LONG TERM GOAL #5   Title taking off a pull over top with pain decreased >/= 75%   Time 8   Period  Weeks   Status New     Additional Long Term Goals   Additional Long Term Goals Yes     PT LONG TERM GOAL #6   Title putting a belt on with left arm with pain decreased >/= 75% due to interal rotation increased    Time 8    Period Weeks   Status New     PT LONG TERM GOAL #7   Title FOTO score </= 30% limitation   Time 8   Period Weeks   Status New               Plan - 12/27/16 1525    Clinical Impression Statement Patient is a 51 year old male with chronic left shoulder pain for 1 year with sudden onset.  Patient reports intermittent left shoulder pain at level 10/10 with overhead movement and reaching behind his back. Left shoulder AROM and PROM are limited due to pain and decreased strength.  Tenderness located in left A-C joint, left rotator cuff tendon, left supraspinatus.  Thickness felt on left rotator cuff insertion.  Patient has full cervical ROM.  Patient has increased pain if he lays on left shoulder and reaching behind his back. Patient is low complexity due to evolving condition and no comorbidities that will impact plan of care. Patient will benefit from skilled therapy to reduce pain, improve left shoulder ROM and strength and restore motion.    Rehab Potential Excellent   Clinical Impairments Affecting Rehab Potential None   PT Frequency 2x / week   PT Duration 8 weeks   PT Treatment/Interventions Cryotherapy;Electrical Stimulation;Iontophoresis 75m/ml Dexamethasone;Ultrasound;Moist Heat;Therapeutic activities;Therapeutic exercise;Neuromuscular re-education;Patient/family education;Passive range of motion;Manual techniques;Dry needling;Taping   PT Next Visit Plan See if ionto patch helped; joint mobilization to left shoulder; ROM exercises to left shoulder; pendulums; left shoulder isometrics; ultrasound to left shoulder   PT Home Exercise Plan progress as neede   Recommended Other Services None   Consulted and Agree with Plan of Care Patient      Patient will benefit from skilled therapeutic intervention in order to improve the following deficits and impairments:  Decreased range of motion, Increased fascial restricitons, Decreased endurance, Increased muscle spasms, Decreased activity  tolerance, Pain, Hypomobility, Decreased strength, Decreased mobility  Visit Diagnosis: Muscle weakness (generalized) - Plan: PT plan of care cert/re-cert  Chronic left shoulder pain - Plan: PT plan of care cert/re-cert  Stiffness of left shoulder, not elsewhere classified - Plan: PT plan of care cert/re-cert     Problem List Patient Active Problem List   Diagnosis Date Noted  . Chronic diastolic heart failure ([place] 10/31/2016  . Severe obesity (BMI 35.0-39.9) with comorbidity ([place] 08/12/2016  . [place]UM[place] 06/16/2009  . TRANSVERSE MYELITIS 05/01/2009  . PROSTATITIS, ACUTE 04/17/2009  . NUMBNESS 04/17/2009  . Allergic rhinitis 03/12/2009  . Type 2 diabetes mellitus with insulin therapy ([place] 02/26/2007  . Hyperlipidemia, unspecified 02/26/2007  . OVERWEIGHT 02/26/2007  . Essential hypertension 02/26/2007    CEarlie Counts PT 12/27/16 3:33 PM   [facility] [address] [place] [address]Phone: [phone]  Fax:  [phone] Name: Tyler NesbitMRN: [id number] of Birth: [birth year]

## 2016-12-27 NOTE — Patient Instructions (Signed)

## 2017-01-02 ENCOUNTER — Ambulatory Visit: Payer: 59 | Admitting: Physical Therapy

## 2017-01-02 ENCOUNTER — Encounter: Payer: Self-pay | Admitting: Physical Therapy

## 2017-01-02 DIAGNOSIS — M25512 Pain in left shoulder: Secondary | ICD-10-CM

## 2017-01-02 DIAGNOSIS — M25612 Stiffness of left shoulder, not elsewhere classified: Secondary | ICD-10-CM

## 2017-01-02 DIAGNOSIS — M6281 Muscle weakness (generalized): Secondary | ICD-10-CM | POA: Diagnosis not present

## 2017-01-02 DIAGNOSIS — G8929 Other chronic pain: Secondary | ICD-10-CM

## 2017-01-02 NOTE — Therapy (Signed)
[facility] [address] [place], [place], [address] Phone: [phone]   Fax:  [phone]  Physical Therapy Treatment  Patient Details  Name: Tyler Aguirre MRN: [id number] Date of Birth: Dec 06, 1965 Referring Provider: Dr. Betty Martinique  Encounter Date: 01/02/2017      PT End of Session - 01/02/17 1539    Visit Number 2   Date for PT Re-Evaluation 02/21/17   PT Start Time 1531   PT Stop Time 1611   PT Time Calculation (min) 40 min   Activity Tolerance Patient tolerated treatment well   Behavior During Therapy [facility] for tasks assessed/performed      Past Medical History:  Diagnosis Date  . ALLERGIC RHINITIS 03/12/2009  . DIABETES MELLITUS, TYPE II 02/26/2007  . [place] DISEASE, LUMBAR 06/16/2009  . HYPERLIPIDEMIA 02/26/2007  . HYPERTENSION 02/26/2007  . NUMBNESS 04/17/2009  . Overweight(278.02) 02/26/2007  . PROSTATITIS, ACUTE 04/17/2009  . 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 COLONOSCOPY    . DENTAL SURGERY    . POLYPECTOMY      There were no vitals filed for this visit.      Subjective Assessment - 01/02/17 1536    Subjective Patch did not help at all. Had terrible pain Friday night, he reports he just started to move the shoulder and it started to feel better.    Currently in Pain? Yes   Pain Score 2    Pain Location Shoulder   Pain Orientation Left   Pain Descriptors / Indicators Sore   Aggravating Factors  reaching out to the side   Pain Relieving Factors not sure   Multiple Pain Sites No            [facility] PT Assessment - 01/02/17 0001      AROM   Left Shoulder Flexion 151 Degrees   Left Shoulder ABduction 151 Degrees                     [facility] Adult PT Treatment/Exercise - 01/02/17 0001      Self-Care   Self-Care Posture   Posture Sitting posture, retracting shoudlers , educated on impingement      Shoulder Exercises: Seated   Horizontal ABduction Strengthening;Both;20 reps;Theraband   Theraband Level (Shoulder Horizontal ABduction) Level 2 (Red)     Shoulder Exercises: Standing   Other Standing Exercises Red rockwood 4 10x each      Shoulder Exercises: ROM/Strengthening   UBE (Upper Arm Bike) L3 3x3 with posture focus     Modalities   Modalities Iontophoresis     Iontophoresis   Type of Iontophoresis Dexamethasone  #2, skin intact   Location left A-C joint   Dose 1 ml   Time 6 hour patch                PT Education - 01/02/17 1545    Education provided Yes   Education Details Red [place] 4 for HEP   Person(s) Educated Patient   Methods Explanation;Demonstration;Tactile cues;Verbal cues;Handout   Comprehension Returned demonstration;Verbalized understanding          PT Short Term Goals - 01/02/17 1600      PT SHORT TERM GOAL #1   Title independent with initial HEP   Time 4   Period Weeks   Status Achieved     PT SHORT TERM GOAL #2   Title raise left arm overhead with pain decreased >/= 25%  Time 4   Period Weeks   Status Achieved           PT Long Term Goals - 12/27/16 1517      PT LONG TERM GOAL #1   Title independent with HEP   Time 8   Period Weeks   Status New     PT LONG TERM GOAL #2   Title sleep through the night and not wake up due to shoulder pain   Time 8   Period Weeks   Status New     PT LONG TERM GOAL #3   Title raise left arm overhead to work on cables for work with pain decreased >/= 75% and left shoulder fleixon and abduction AROM >/= 140 degrees   Time 8   Period Weeks   Status New     PT LONG TERM GOAL #4   Title reach behind to get and item with left arm due to left shoulder strength >/= 4/5   Time 8   Period Weeks   Status New     PT LONG TERM GOAL #5   Title taking off a pull over top with pain decreased >/= 75%   Time 8   Period Weeks   Status New     Additional Long Term Goals   Additional Long Term Goals Yes     PT LONG  TERM GOAL #6   Title putting a belt on with left arm with pain decreased >/= 75% due to interal rotation increased    Time 8   Period Weeks   Status New     PT LONG TERM GOAL #7   Title FOTO score </= 30% limitation   Time 8   Period Weeks   Status New               Plan - 01/02/17 1549    Clinical Impression Statement Pt reports he started moving and stretching his Lt shoulder out Friday night due to pain and it started to gradually feel al lot better. Pt had AROM for flexion and abduction WNL and essentially pain free.  He reports the ionto patch was questionable but wanted to do it again. Pt was started on a theraband program for rotator cuff strength and  educated on his posture.     Rehab Potential Excellent   Clinical Impairments Affecting Rehab Potential None   PT Frequency 2x / week   PT Duration 8 weeks   PT Treatment/Interventions Cryotherapy;Electrical Stimulation;Iontophoresis 21m/ml Dexamethasone;Ultrasound;Moist Heat;Therapeutic activities;Therapeutic exercise;Neuromuscular re-education;Patient/family education;Passive range of motion;Manual techniques;Dry needling;Taping   PT Next Visit Plan Review [place], ionto #3, scapular stabilization, try UKoreaif pain is limiting factor.    Consulted and Agree with Plan of Care --      Patient will benefit from skilled therapeutic intervention in order to improve the following deficits and impairments:  Decreased range of motion, Increased fascial restricitons, Decreased endurance, Increased muscle spasms, Decreased activity tolerance, Pain, Hypomobility, Decreased strength, Decreased mobility  Visit Diagnosis: Muscle weakness (generalized)  Chronic left shoulder pain  Stiffness of left shoulder, not elsewhere classified     Problem List Patient Active Problem List   Diagnosis Date Noted  . Chronic diastolic heart failure ([place] 10/31/2016  . Severe obesity (BMI 35.0-39.9) with comorbidity ([place] 08/12/2016  . [place] DISEASE, LUMBAR 06/16/2009  . TRANSVERSE MYELITIS 05/01/2009  . PROSTATITIS, ACUTE 04/17/2009  . NUMBNESS 04/17/2009  . Allergic rhinitis 03/12/2009  . Type 2 diabetes mellitus with insulin therapy (  [place]) 02/26/2007  . Hyperlipidemia, unspecified 02/26/2007  . OVERWEIGHT 02/26/2007  . Essential hypertension 02/26/2007    Tyler Aguirre, PTA 01/02/2017, 4:16 PM  [facility] [address] [place], [place], [address] Phone: [phone]   Fax:  [phone]  Name: Tyler Aguirre MRN: [id number] Date of Birth: [birth year]

## 2017-01-02 NOTE — Patient Instructions (Signed)
Strengthening: Resisted Internal Rotation   Hold tubing in left hand, elbow at side and forearm out. Rotate forearm in across body. Repeat ____ times per set. Do ____ sets per session. Do ____ sessions per day.  http://orth.exer.us/830   Copyright  VHI. All rights reserved.  Strengthening: Resisted External Rotation   Hold tubing in right hand, elbow at side and forearm across body. Keep arm more like the above picture more at 90 degree angle. Do 2x day, 10-20 reps..  http://orth.exer.us/828   Copyright  VHI. All rights reserved.  Strengthening: Resisted Flexion   Hold tubing with left arm at side. Punch forward. Elbow should bend and straighten.  Move shoulder through pain-free range of motion. Repeat ___10-20_ times per set. Do 1___ sets per session. Do _1-2___ sessions per day.  http://orth.exer.us/824   Copyright  VHI. All rights reserved.  Strengthening: Resisted Extension   Hold tubing in left hand, arm forward. Pull arm back, elbow bent, not like the picture Repeat __10-20__ times per set. Do __1__ sets per session. Do ___1-2_ sessions per day.  http://orth.exer.us/832   Copyright  VHI. All rights reserved.

## 2017-01-04 ENCOUNTER — Encounter: Payer: Self-pay | Admitting: Physical Therapy

## 2017-01-04 ENCOUNTER — Ambulatory Visit: Payer: 59 | Admitting: Physical Therapy

## 2017-01-04 DIAGNOSIS — M25612 Stiffness of left shoulder, not elsewhere classified: Secondary | ICD-10-CM

## 2017-01-04 DIAGNOSIS — M6281 Muscle weakness (generalized): Secondary | ICD-10-CM | POA: Diagnosis not present

## 2017-01-04 DIAGNOSIS — M25512 Pain in left shoulder: Secondary | ICD-10-CM

## 2017-01-04 DIAGNOSIS — G8929 Other chronic pain: Secondary | ICD-10-CM

## 2017-01-04 NOTE — Therapy (Signed)
[facility] [address] [place], [place], [address] Phone: [phone]   Fax:  [phone]  Physical Therapy Treatment  Patient Details  Name: Tyler Aguirre MRN: [id number] Date of Birth: [birth year] Referring Provider: Dr. Betty Martinique  Encounter Date: 01/04/2017      PT End of Session - 01/04/17 1539    Visit Number 3   Date for PT Re-Evaluation 02/21/17   PT Start Time 1533   PT Stop Time 1615   PT Time Calculation (min) 42 min   Behavior During Therapy [facility] for tasks assessed/performed      Past Medical History:  Diagnosis Date  . ALLERGIC RHINITIS 03/12/2009  . DIABETES MELLITUS, TYPE II 02/26/2007  . [place] DISEASE, LUMBAR 06/16/2009  . HYPERLIPIDEMIA 02/26/2007  . HYPERTENSION 02/26/2007  . NUMBNESS 04/17/2009  . Overweight(278.02) 02/26/2007  . PROSTATITIS, ACUTE 04/17/2009  . 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 COLONOSCOPY    . DENTAL SURGERY    . POLYPECTOMY      There were no vitals filed for this visit.      Subjective Assessment - 01/04/17 1541    Subjective No pain in last 24 hours.    Currently in Pain? No/denies   Multiple Pain Sites No                         [facility] Adult PT Treatment/Exercise - 01/04/17 0001      Shoulder Exercises: Seated   Horizontal ABduction Strengthening;Both;20 reps;Theraband   Theraband Level (Shoulder Horizontal ABduction) Level 2 (Red)     Shoulder Exercises: Prone   Other Prone Exercises Bent over counter top: rows 3# 10x, extension 2# 10x, horizontal abd 1# 10x  Tactile cues for scap depression     Shoulder Exercises: Standing   Other Standing Exercises Red rockwood 20x each      Shoulder Exercises: Pulleys   Flexion 3 minutes     Shoulder Exercises: ROM/Strengthening   UBE (Upper Arm Bike) L3 3x3 with posture focus  Review of status     Shoulder Exercises: Stretch   Corner Stretch  3 reps;20 seconds  Pain bil, better doing it single arm on the door frame     Ultrasound   Ultrasound Location LT RTC insertion   Ultrasound Parameters 100% 1MZ 1.2wtcm2 x 8 min   Ultrasound Goals Pain     Iontophoresis   Type of Iontophoresis --  Declined                  PT Short Term Goals - 01/02/17 1600      PT SHORT TERM GOAL #1   Title independent with initial HEP   Time 4   Period Weeks   Status Achieved     PT SHORT TERM GOAL #2   Title raise left arm overhead with pain decreased >/= 25%   Time 4   Period Weeks   Status Achieved           PT Long Term Goals - 12/27/16 1517      PT LONG TERM GOAL #1   Title independent with HEP   Time 8   Period Weeks   Status New     PT LONG TERM GOAL #2   Title sleep through the night and not wake up due to shoulder pain   Time 8   Period Weeks   Status New  PT LONG TERM GOAL #3   Title raise left arm overhead to work on cables for work with pain decreased >/= 75% and left shoulder fleixon and abduction AROM >/= 140 degrees   Time 8   Period Weeks   Status New     PT LONG TERM GOAL #4   Title reach behind to get and item with left arm due to left shoulder strength >/= 4/5   Time 8   Period Weeks   Status New     PT LONG TERM GOAL #5   Title taking off a pull over top with pain decreased >/= 75%   Time 8   Period Weeks   Status New     Additional Long Term Goals   Additional Long Term Goals Yes     PT LONG TERM GOAL #6   Title putting a belt on with left arm with pain decreased >/= 75% due to interal rotation increased    Time 8   Period Weeks   Status New     PT LONG TERM GOAL #7   Title FOTO score </= 30% limitation   Time 8   Period Weeks   Status New               Plan - 01/04/17 1539    Clinical Impression Statement Pt doing very well with his rehab. Pain is significantly down, and was even able to swing a golf club painfree yesterday.  Some pain was noted with  resisted external rotation using the red band today in the LT upper arm. Pt has difficulty keeping scapula connected to his midback with shoulder exercises.    Rehab Potential Excellent   Clinical Impairments Affecting Rehab Potential None   PT Frequency 2x / week   PT Duration 8 weeks   PT Treatment/Interventions Cryotherapy;Electrical Stimulation;Iontophoresis 16m/ml Dexamethasone;Ultrasound;Moist Heat;Therapeutic activities;Therapeutic exercise;Neuromuscular re-education;Patient/family education;Passive range of motion;Manual techniques;Dry needling;Taping   PT Next Visit Plan Continue with RTC strength, opening anterior shoulder/chest, prone exs. UKoreaif needed for pain. Pt may want to discontinue ionto.   Consulted and Agree with Plan of Care --      Patient will benefit from skilled therapeutic intervention in order to improve the following deficits and impairments:  Decreased range of motion, Increased fascial restricitons, Decreased endurance, Increased muscle spasms, Decreased activity tolerance, Pain, Hypomobility, Decreased strength, Decreased mobility  Visit Diagnosis: Muscle weakness (generalized)  Chronic left shoulder pain  Stiffness of left shoulder, not elsewhere classified     Problem List Patient Active Problem List   Diagnosis Date Noted  . Chronic diastolic heart failure ([place] 10/31/2016  . Severe obesity (BMI 35.0-39.9) with comorbidity ([place] 08/12/2016  . [place]EASE, LUMBAR 06/16/2009  . TRANSVERSE MYELITIS 05/01/2009  . PROSTATITIS, ACUTE 04/17/2009  . NUMBNESS 04/17/2009  . Allergic rhinitis 03/12/2009  . Type 2 diabetes mellitus with insulin therapy ([place] 02/26/2007  . Hyperlipidemia, unspecified 02/26/2007  . OVERWEIGHT 02/26/2007  . Essential hypertension 02/26/2007    Javier Mamone, PTA 01/04/2017, 4:15 PM  [facility] [address] [place] [address]: [phone]   Fax:  [phone] Name: Tyler DisneyMRN: [id number] of Birth: [birth year]

## 2017-01-09 ENCOUNTER — Ambulatory Visit: Payer: 59 | Admitting: Physical Therapy

## 2017-01-09 ENCOUNTER — Encounter: Payer: Self-pay | Admitting: Physical Therapy

## 2017-01-09 DIAGNOSIS — M6281 Muscle weakness (generalized): Secondary | ICD-10-CM | POA: Diagnosis not present

## 2017-01-09 DIAGNOSIS — M25612 Stiffness of left shoulder, not elsewhere classified: Secondary | ICD-10-CM

## 2017-01-09 DIAGNOSIS — M25512 Pain in left shoulder: Principal | ICD-10-CM

## 2017-01-09 DIAGNOSIS — G8929 Other chronic pain: Secondary | ICD-10-CM

## 2017-01-09 NOTE — Therapy (Signed)
[facility] [address] [place], [place], [address] Phone: [phone]   Fax:  [phone]  Physical Therapy Treatment  Patient Details  Name: Tyler Aguirre MRN: [id number] Date of Birth: 06/06/1966 Referring Provider: Dr. Betty Jordan  Encounter Date: 01/09/2017      PT End of Session - 01/09/17 1617    Visit Number 4   Date for PT Re-Evaluation 02/21/17   PT Start Time 1615   PT Stop Time 1655   PT Time Calculation (min) 40 min   Activity Tolerance Patient tolerated treatment well   Behavior During Therapy [facility] for tasks assessed/performed      Past Medical History:  Diagnosis Date  . ALLERGIC RHINITIS 03/12/2009  . DIABETES MELLITUS, TYPE II 02/26/2007  . [place] DISEASE, LUMBAR 06/16/2009  . HYPERLIPIDEMIA 02/26/2007  . HYPERTENSION 02/26/2007  . NUMBNESS 04/17/2009  . Overweight(278.02) 02/26/2007  . PROSTATITIS, ACUTE 04/17/2009  . 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 COLONOSCOPY    . DENTAL SURGERY    . POLYPECTOMY      There were no vitals filed for this visit.      Subjective Assessment - 01/09/17 1615    Subjective Saturday I was taking apart an overhead shelf and my left arm was jerked and went numb.  I had tingling for 10 min. I did the exercises and turned one direction and my shoulder popped and felt better. Today I have more tenderness than last week.    Patient Stated Goals reduce pain   Currently in Pain? Yes   Pain Score 1    Pain Location Shoulder   Pain Orientation Left   Pain Descriptors / Indicators Sore   Pain Type Chronic pain   Pain Onset More than a month ago   Pain Frequency Intermittent   Aggravating Factors  reaching out to the side   Pain Relieving Factors Not sure   Multiple Pain Sites No            [facility] PT Assessment - 01/09/17 0001      Strength   Left Shoulder Flexion 4-/5   Left Shoulder ABduction 4-/5   Left  Shoulder Internal Rotation 4+/5   Left Shoulder External Rotation 3+/5                     [facility] Adult PT Treatment/Exercise - 01/09/17 0001      Shoulder Exercises: Seated   External Rotation Both;10 reps;Strengthening   External Rotation Limitations hands behind head like a butterfly motion   Flexion Strengthening;Both;20 reps   Abduction Strengthening;Both;20 reps     Shoulder Exercises: Standing   Horizontal ABduction Strengthening;Both;15 reps;Theraband   Theraband Level (Shoulder Horizontal ABduction) Level 2 (Red)   Extension Strengthening;Both;15 reps;Theraband   Theraband Level (Shoulder Extension) Level 2 (Red)   Other Standing Exercises PNF with red band 15 times each way     Shoulder Exercises: Pulleys   Flexion 3 minutes   ABduction 3 minutes     Shoulder Exercises: ROM/Strengthening   UBE (Upper Arm Bike) L3 3x3 with posture focus  Review of status     Shoulder Exercises: Stretch   Corner Stretch 3 reps;20 seconds  Pain bil, better doing it single arm on the door frame     Iontophoresis   Type of Iontophoresis Dexamethasone  #2, skin intact   Location left A-C joint   Dose 1 ml  #2     Time 6 hour patch     Manual Therapy   Manual Therapy Joint mobilization   Joint Mobilization gapping of T1-T5 on left side and rotational mobilzation                PT Education - 01/09/17 1649    Education provided No          PT Short Term Goals - 01/09/17 1618      PT SHORT TERM GOAL #3   Title raise left arm out to side to put a coat on with pain decreased >/= 25%   Time 4   Period Weeks   Status Achieved  60% better     PT SHORT TERM GOAL #4   Title pain with laying on left shoulder decreased >/= 25%   Time 4   Period Weeks   Status Achieved  60% better           PT Long Term Goals - 12/27/16 1517      PT LONG TERM GOAL #1   Title independent with HEP   Time 8   Period Weeks   Status New     PT LONG TERM GOAL #2    Title sleep through the night and not wake up due to shoulder pain   Time 8   Period Weeks   Status New     PT LONG TERM GOAL #3   Title raise left arm overhead to work on cables for work with pain decreased >/= 75% and left shoulder fleixon and abduction AROM >/= 140 degrees   Time 8   Period Weeks   Status New     PT LONG TERM GOAL #4   Title reach behind to get and item with left arm due to left shoulder strength >/= 4/5   Time 8   Period Weeks   Status New     PT LONG TERM GOAL #5   Title taking off a pull over top with pain decreased >/= 75%   Time 8   Period Weeks   Status New     Additional Long Term Goals   Additional Long Term Goals Yes     PT LONG TERM GOAL #6   Title putting a belt on with left arm with pain decreased >/= 75% due to interal rotation increased    Time 8   Period Weeks   Status New     PT LONG TERM GOAL #7   Title FOTO score </= 30% limitation   Time 8   Period Weeks   Status New               Plan - 01/09/17 1649    Clinical Impression Statement Patient has met his STG's.  Patient pain has decreased by 60%. Patient has increased left shoulder strength and ROM.  Patient has tightness in the A-C joint and thoracic spine.  Patient had full flexion and abduction of left shoulder after joint mobilization of thoracic spine.  Patient has some difficulty with scapula depression.  Patient will benefit from skilled therapy to reduce pain and increase strength of left shoulder.    Rehab Potential Excellent   Clinical Impairments Affecting Rehab Potential None   PT Frequency 2x / week   PT Duration 8 weeks   PT Treatment/Interventions Cryotherapy;Electrical Stimulation;Iontophoresis 49m/ml Dexamethasone;Ultrasound;Moist Heat;Therapeutic activities;Therapeutic exercise;Neuromuscular re-education;Patient/family education;Passive range of motion;Manual techniques;Dry needling;Taping   PT Next Visit Plan Continue with RTC strength, opening anterior  shoulder/chest, prone exs. See  how patient did with ionto, work on left scapula depression.    PT Home Exercise Plan progress as needed   Consulted and Agree with Plan of Care Patient      Patient will benefit from skilled therapeutic intervention in order to improve the following deficits and impairments:  Decreased range of motion, Increased fascial restricitons, Decreased endurance, Increased muscle spasms, Decreased activity tolerance, Pain, Hypomobility, Decreased strength, Decreased mobility  Visit Diagnosis: Chronic left shoulder pain  Muscle weakness (generalized)  Stiffness of left shoulder, not elsewhere classified     Problem List Patient Active Problem List   Diagnosis Date Noted  . Chronic diastolic heart failure ([place]) 10/31/2016  . Severe obesity (BMI 35.0-39.9) with comorbidity ([place]) 08/12/2016  . [place] DISEASE, LUMBAR 06/16/2009  . TRANSVERSE MYELITIS 05/01/2009  . PROSTATITIS, ACUTE 04/17/2009  . NUMBNESS 04/17/2009  . Allergic rhinitis 03/12/2009  . Type 2 diabetes mellitus with insulin therapy ([place]) 02/26/2007  . Hyperlipidemia, unspecified 02/26/2007  . OVERWEIGHT 02/26/2007  . Essential hypertension 02/26/2007    Earlie Counts, PT 01/09/17 4:53 PM   [facility] [address] [place], [place], [address] Phone: [phone]   Fax:  [phone]  Name: Tyler Aguirre MRN: [id number] Date of Birth: 04/06/1966

## 2017-01-11 ENCOUNTER — Ambulatory Visit: Payer: 59 | Admitting: Physical Therapy

## 2017-01-11 ENCOUNTER — Encounter: Payer: Self-pay | Admitting: Physical Therapy

## 2017-01-11 DIAGNOSIS — M6281 Muscle weakness (generalized): Secondary | ICD-10-CM | POA: Diagnosis not present

## 2017-01-11 DIAGNOSIS — M25512 Pain in left shoulder: Principal | ICD-10-CM

## 2017-01-11 DIAGNOSIS — M25612 Stiffness of left shoulder, not elsewhere classified: Secondary | ICD-10-CM

## 2017-01-11 DIAGNOSIS — G8929 Other chronic pain: Secondary | ICD-10-CM

## 2017-01-11 NOTE — Therapy (Signed)
[facility] [address], [place], [place], [address] Phone: [phone]   Fax:  [phone]  Physical Therapy Treatment  Patient Details  Name: Tyler Aguirre MRN: [id number] Date of Birth: Aug 02, 1966 Referring Provider: Dr. Betty Martinique  Encounter Date: 01/11/2017      PT End of Session - 01/11/17 1623    Visit Number 5   Date for PT Re-Evaluation 02/21/17   PT Start Time 1621   PT Stop Time 7673   PT Time Calculation (min) 54 min   Activity Tolerance Patient limited by pain   Behavior During Therapy [facility] for tasks assessed/performed      Past Medical History:  Diagnosis Date  . ALLERGIC RHINITIS 03/12/2009  . DIABETES MELLITUS, TYPE II 02/26/2007  . [place] DISEASE, LUMBAR 06/16/2009  . HYPERLIPIDEMIA 02/26/2007  . HYPERTENSION 02/26/2007  . NUMBNESS 04/17/2009  . Overweight(278.02) 02/26/2007  . PROSTATITIS, ACUTE 04/17/2009  . 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 COLONOSCOPY    . DENTAL SURGERY    . POLYPECTOMY      There were no vitals filed for this visit.      Subjective Assessment - 01/11/17 1625    Subjective Slept "wrong" last night and awoke with pain from his neck to his shoulder. Has hurt all day.    Currently in Pain? Yes   Pain Score --  5   Pain Location --  LT upper trap inot the shoulder   Pain Descriptors / Indicators Sore;Tightness   Multiple Pain Sites No                         [facility] Adult PT Treatment/Exercise - 01/11/17 0001      Shoulder Exercises: ROM/Strengthening   UBE (Upper Arm Bike) L3 x 5 min reverse     Moist Heat Therapy   Number Minutes Moist Heat 15 Minutes   Moist Heat Location --  Lt upper trap and shoulder     Electrical Stimulation   Electrical Stimulation Location LT upper trap and shoulder   Electrical Stimulation Action IFC   Electrical Stimulation Parameters 80-150 HZ in hooklying    Electrical Stimulation Goals Pain     Manual Therapy   Manual Therapy Soft tissue mobilization   Soft tissue mobilization LT cervical and upper trap with trigger point deactivation.                   PT Short Term Goals - 01/09/17 1618      PT SHORT TERM GOAL #3   Title raise left arm out to side to put a coat on with pain decreased >/= 25%   Time 4   Period Weeks   Status Achieved  60% better     PT SHORT TERM GOAL #4   Title pain with laying on left shoulder decreased >/= 25%   Time 4   Period Weeks   Status Achieved  60% better           PT Long Term Goals - 12/27/16 1517      PT LONG TERM GOAL #1   Title independent with HEP   Time 8   Period Weeks   Status New     PT LONG TERM GOAL #2   Title sleep through the night and not wake up due to shoulder pain   Time 8   Period Weeks  Status New     PT LONG TERM GOAL #3   Title raise left arm overhead to work on cables for work with pain decreased >/= 75% and left shoulder fleixon and abduction AROM >/= 140 degrees   Time 8   Period Weeks   Status New     PT LONG TERM GOAL #4   Title reach behind to get and item with left arm due to left shoulder strength >/= 4/5   Time 8   Period Weeks   Status New     PT LONG TERM GOAL #5   Title taking off a pull over top with pain decreased >/= 75%   Time 8   Period Weeks   Status New     Additional Long Term Goals   Additional Long Term Goals Yes     PT LONG TERM GOAL #6   Title putting a belt on with left arm with pain decreased >/= 75% due to interal rotation increased    Time 8   Period Weeks   Status New     PT LONG TERM GOAL #7   Title FOTO score </= 30% limitation   Time 8   Period Weeks   Status New               Plan - 01/11/17 1624    Clinical Impression Statement Pt reports sleeping "wrong" last night and presented with pain and guarded neck/shoulder movement today. He had multiple active, painful  trigger points along the Lt  upper trap. Pain lessened after session and could move his Lt shoulder and neck better.    Rehab Potential Excellent   Clinical Impairments Affecting Rehab Potential None   PT Frequency 2x / week   PT Duration 8 weeks   PT Treatment/Interventions Cryotherapy;Electrical Stimulation;Iontophoresis 50m/ml Dexamethasone;Ultrasound;Moist Heat;Therapeutic activities;Therapeutic exercise;Neuromuscular re-education;Patient/family education;Passive range of motion;Manual techniques;Dry needling;Taping   PT Next Visit Plan If pain in upper trap better, resume RTC strength. Manual to LT uppere trap if needed.    Consulted and Agree with Plan of Care --      Patient will benefit from skilled therapeutic intervention in order to improve the following deficits and impairments:  Decreased range of motion, Increased fascial restricitons, Decreased endurance, Increased muscle spasms, Decreased activity tolerance, Pain, Hypomobility, Decreased strength, Decreased mobility  Visit Diagnosis: Chronic left shoulder pain  Stiffness of left shoulder, not elsewhere classified     Problem List Patient Active Problem List   Diagnosis Date Noted  . Chronic diastolic heart failure ([place] 10/31/2016  . Severe obesity (BMI 35.0-39.9) with comorbidity ([place] 08/12/2016  . [place]EASE, LUMBAR 06/16/2009  . TRANSVERSE MYELITIS 05/01/2009  . PROSTATITIS, ACUTE 04/17/2009  . NUMBNESS 04/17/2009  . Allergic rhinitis 03/12/2009  . Type 2 diabetes mellitus with insulin therapy ([place] 02/26/2007  . Hyperlipidemia, unspecified 02/26/2007  . OVERWEIGHT 02/26/2007  . Essential hypertension 02/26/2007    Tyler Aguirre, PTA 01/11/2017, 4:57 PM  [facility] [address] [place] [address]Phone: [phone]  Fax:  [phone] Name: Tyler OommenMRN: [id number] of Birth: 1May 24, 1967

## 2017-01-17 ENCOUNTER — Encounter: Payer: Self-pay | Admitting: Physical Therapy

## 2017-01-17 ENCOUNTER — Ambulatory Visit: Payer: 59 | Admitting: Physical Therapy

## 2017-01-17 DIAGNOSIS — M6281 Muscle weakness (generalized): Secondary | ICD-10-CM | POA: Diagnosis not present

## 2017-01-17 DIAGNOSIS — M25612 Stiffness of left shoulder, not elsewhere classified: Secondary | ICD-10-CM

## 2017-01-17 DIAGNOSIS — M25512 Pain in left shoulder: Principal | ICD-10-CM

## 2017-01-17 DIAGNOSIS — G8929 Other chronic pain: Secondary | ICD-10-CM

## 2017-01-17 NOTE — Therapy (Signed)
[facility] [address] [place] [address] [place], [place], [address] Phone: [phone]   Fax:  [phone]  Physical Therapy Treatment  Patient Details  Name: Tyler Aguirre MRN: [id number] Date of Birth: 07-23-1966 Referring Provider: Dr. Betty Martinique  Encounter Date: 01/17/2017      PT End of Session - 01/17/17 1621    Visit Number 6   Date for PT Re-Evaluation 02/21/17   PT Start Time 1615   PT Stop Time 1700   PT Time Calculation (min) 45 min   Activity Tolerance Patient limited by pain   Behavior During Therapy [facility] for tasks assessed/performed      Past Medical History:  Diagnosis Date  . ALLERGIC RHINITIS 03/12/2009  . DIABETES MELLITUS, TYPE II 02/26/2007  . [place] DISEASE, LUMBAR 06/16/2009  . HYPERLIPIDEMIA 02/26/2007  . HYPERTENSION 02/26/2007  . NUMBNESS 04/17/2009  . Overweight(278.02) 02/26/2007  . PROSTATITIS, ACUTE 04/17/2009  . 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 COLONOSCOPY    . DENTAL SURGERY    . POLYPECTOMY      There were no vitals filed for this visit.      Subjective Assessment - 01/17/17 1620    Subjective States his neck is feeling a lot better. Reports he is tired from work   Denies pain in shoulder   Currently in Pain? No/denies                         [facility] Adult PT Treatment/Exercise - 01/17/17 0001      Shoulder Exercises: Standing   Other Standing Exercises wall push ups  cues to keep upper traps relaxed     Shoulder Exercises: ROM/Strengthening   UBE (Upper Arm Bike) L3 x 5 min reverse     Shoulder Exercises: Isometric Strengthening   ABduction Supine;Theraband  20x green tband   Theraband Level (ABduction) Level 3 (Green)     Shoulder Exercises: IT sales professional 3 reps;20 seconds   Star Gazer Stretch 3 reps;Other (comment)  up to about 65 degrees abductoin had pain   Other Shoulder Stretches thoracic  extension in chair     Shoulder Exercises: Power Development worker, community 20 reps  25#   Row 20 reps  25#   External Rotation 20 reps  20#   Internal Rotation 20 reps  20#   Other Power Tower Exercises serratus punch 20# x20   Other Power Tower Exercises D2 ext 1 plate 20x     Iontophoresis   Type of Iontophoresis Dexamethasone  #4, skin intact   Location left A-C joint   Dose 1 ml  #2   Time 6 hour patch                  PT Short Term Goals - 01/09/17 1618      PT SHORT TERM GOAL #3   Title raise left arm out to side to put a coat on with pain decreased >/= 25%   Time 4   Period Weeks   Status Achieved  60% better     PT SHORT TERM GOAL #4   Title pain with laying on left shoulder decreased >/= 25%   Time 4   Period Weeks   Status Achieved  60% better           PT Long Term Goals - 01/17/17 1705      PT  LONG TERM GOAL #1   Title independent with HEP   Time 8   Period Weeks   Status On-going     PT LONG TERM GOAL #2   Title sleep through the night and not wake up due to shoulder pain   Time 8   Period Weeks   Status On-going     PT LONG TERM GOAL #3   Title raise left arm overhead to work on cables for work with pain decreased >/= 75% and left shoulder fleixon and abduction AROM >/= 140 degrees   Time 8   Period Weeks   Status On-going     PT LONG TERM GOAL #4   Title reach behind to get and item with left arm due to left shoulder strength >/= 4/5   Time 8   Period Weeks   Status On-going     PT LONG TERM GOAL #5   Title taking off a pull over top with pain decreased >/= 75%   Time 8   Period Weeks   Status On-going     PT LONG TERM GOAL #6   Title putting a belt on with left arm with pain decreased >/= 75% due to interal rotation increased    Time 8   Period Weeks   Status On-going     PT LONG TERM GOAL #7   Title FOTO score </= 30% limitation   Time 8   Period Weeks   Status On-going               Plan - 01/17/17  1622    Clinical Impression Statement Patient was feeling much better today and able to perform more strengthening exercises.  Only had pain with shoulder abduction in supine (star gazers).  Continues to need skilled PT for increased strength and ROM.   Rehab Potential Excellent   PT Treatment/Interventions Cryotherapy;Electrical Stimulation;Iontophoresis 40m/ml Dexamethasone;Ultrasound;Moist Heat;Therapeutic activities;Therapeutic exercise;Neuromuscular re-education;Patient/family education;Passive range of motion;Manual techniques;Dry needling;Taping   PT Next Visit Plan continue RTC strengthening, ROM as tolerated and ionto #5   Consulted and Agree with Plan of Care Patient      Patient will benefit from skilled therapeutic intervention in order to improve the following deficits and impairments:  Decreased range of motion, Increased fascial restricitons, Decreased endurance, Increased muscle spasms, Decreased activity tolerance, Pain, Hypomobility, Decreased strength, Decreased mobility  Visit Diagnosis: Chronic left shoulder pain  Stiffness of left shoulder, not elsewhere classified  Muscle weakness (generalized)     Problem List Patient Active Problem List   Diagnosis Date Noted  . Chronic diastolic heart failure (HGarner 10/31/2016  . Severe obesity (BMI 35.0-39.9) with comorbidity ([place]ugar [place] 08/12/2016  . DFloodwoodDISEASE, LUMBAR 06/16/2009  . TRANSVERSE MYELITIS 05/01/2009  . PROSTATITIS, ACUTE 04/17/2009  . NUMBNESS 04/17/2009  . Allergic rhinitis 03/12/2009  . Type 2 diabetes mellitus with insulin therapy (HGang [place] 02/26/2007  . Hyperlipidemia, unspecified 02/26/2007  . OVERWEIGHT 02/26/2007  . Essential hypertension 02/26/2007    JZannie Cove PT 01/17/2017, 5:10 PM   [facility] [address] [place] [address]Phone: [phone]  Fax:  [phone] Name: Tyler MoronesMRN: [id number] of Birth:  [birth year]

## 2017-01-19 ENCOUNTER — Ambulatory Visit: Payer: 59 | Attending: Family Medicine | Admitting: Physical Therapy

## 2017-01-19 ENCOUNTER — Encounter: Payer: Self-pay | Admitting: Physical Therapy

## 2017-01-19 DIAGNOSIS — M25512 Pain in left shoulder: Secondary | ICD-10-CM | POA: Insufficient documentation

## 2017-01-19 DIAGNOSIS — M25612 Stiffness of left shoulder, not elsewhere classified: Secondary | ICD-10-CM | POA: Diagnosis present

## 2017-01-19 DIAGNOSIS — M6281 Muscle weakness (generalized): Secondary | ICD-10-CM | POA: Insufficient documentation

## 2017-01-19 DIAGNOSIS — G8929 Other chronic pain: Secondary | ICD-10-CM | POA: Diagnosis present

## 2017-01-19 NOTE — Therapy (Signed)
[facility] [address] [place], [place], [address] Phone: [phone]   Fax:  [phone]  Physical Therapy Treatment  Patient Details  Name: Tyler Aguirre MRN: [id number] Date of Birth: 01/02/1966 Referring Provider: Dr. Betty Martinique  Encounter Date: 01/19/2017      PT End of Session - 01/19/17 1653    Visit Number 7   Date for PT Re-Evaluation 02/21/17   PT Start Time 8638   PT Stop Time 1650   PT Time Calculation (min) 43 min   Activity Tolerance Patient tolerated treatment well   Behavior During Therapy [facility] for tasks assessed/performed      Past Medical History:  Diagnosis Date  . ALLERGIC RHINITIS 03/12/2009  . DIABETES MELLITUS, TYPE II 02/26/2007  . [place] DISEASE, LUMBAR 06/16/2009  . HYPERLIPIDEMIA 02/26/2007  . HYPERTENSION 02/26/2007  . NUMBNESS 04/17/2009  . Overweight(278.02) 02/26/2007  . PROSTATITIS, ACUTE 04/17/2009  . 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 COLONOSCOPY    . DENTAL SURGERY    . POLYPECTOMY      There were no vitals filed for this visit.      Subjective Assessment - 01/19/17 1622    Subjective feeling better. Last visit doing laying on my back angels hurt.    Patient Stated Goals reduce pain   Currently in Pain? Yes   Pain Score 1    Pain Location Shoulder   Pain Orientation Left   Pain Descriptors / Indicators Sore   Pain Type Chronic pain   Pain Onset More than a month ago   Pain Frequency Intermittent   Aggravating Factors  reaching out to the side   Pain Relieving Factors not sure            [facility] PT Assessment - 01/19/17 0001      Strength   Left Shoulder Flexion 4/5   Left Shoulder ABduction 4/5   Left Shoulder Internal Rotation 4+/5   Left Shoulder External Rotation 4/5                     [facility] Adult PT Treatment/Exercise - 01/19/17 0001      Shoulder Exercises: Seated   External Rotation  Strengthening;Left;10 reps;Weights  arm at 90 degrees abduction   External Rotation Weight (lbs) 1  10x   External Rotation Limitations 2# 2x10   Flexion Left;Strengthening;10 reps;Weights   Flexion Weight (lbs) 2   Flexion Limitations hold 2# at 90 degrees 30 sec x 2   Abduction Left;Strengthening  2x full range; 1 time 1/2 range; stopped pain     Shoulder Exercises: Standing   Other Standing Exercises wall push ups  cues to keep upper traps relaxed   Other Standing Exercises roll prickly ball out to side 20 x      Shoulder Exercises: ROM/Strengthening   UBE (Upper Arm Bike) L3 x 5 min reverse   Rhythmic Stabilization, Seated shoulder midrange abduction 30x up/down, side to side  standing   Other ROM/Strengthening Exercises shoulder abduction UE ranger level 27 20x     Shoulder Exercises: Stretch   Corner Stretch 3 reps;20 seconds   Star Gazer Stretch 3 reps;Other (comment)  up to about 65 degrees abductoin had pain     Shoulder Exercises: Power Development worker, community 20 reps  25#   Row 20 reps  25#   Row Limitations VC on moving scapula to spine   External  Rotation 20 reps  20#   External Rotation Limitations VC to move scapula to spine   Internal Rotation 20 reps  25#   Other Power Tower Exercises serratus punch 20# x20                PT Education - 01/19/17 1653    Education provided No          PT Short Term Goals - 01/09/17 1618      PT SHORT TERM GOAL #3   Title raise left arm out to side to put a coat on with pain decreased >/= 25%   Time 4   Period Weeks   Status Achieved  60% better     PT SHORT TERM GOAL #4   Title pain with laying on left shoulder decreased >/= 25%   Time 4   Period Weeks   Status Achieved  60% better           PT Long Term Goals - 01/17/17 1705      PT LONG TERM GOAL #1   Title independent with HEP   Time 8   Period Weeks   Status On-going     PT LONG TERM GOAL #2   Title sleep through the night and not  wake up due to shoulder pain   Time 8   Period Weeks   Status On-going     PT LONG TERM GOAL #3   Title raise left arm overhead to work on cables for work with pain decreased >/= 75% and left shoulder fleixon and abduction AROM >/= 140 degrees   Time 8   Period Weeks   Status On-going     PT LONG TERM GOAL #4   Title reach behind to get and item with left arm due to left shoulder strength >/= 4/5   Time 8   Period Weeks   Status On-going     PT LONG TERM GOAL #5   Title taking off a pull over top with pain decreased >/= 75%   Time 8   Period Weeks   Status On-going     PT LONG TERM GOAL #6   Title putting a belt on with left arm with pain decreased >/= 75% due to interal rotation increased    Time 8   Period Weeks   Status On-going     PT LONG TERM GOAL #7   Title FOTO score </= 30% limitation   Time 8   Period Weeks   Status On-going               Plan - 01/19/17 1653    Clinical Impression Statement Patient pain has decreased.  Patient has increased left shoulder strength. No ionto today due to no increase in pain. Patient is not able to do left shoulder abduction with weight due to pain.   Patient will benefit from skilled therapy to increase strength and ROM of left shoulder to return to prior activities.    Rehab Potential Excellent   Clinical Impairments Affecting Rehab Potential None   PT Frequency 2x / week   PT Duration 8 weeks   PT Treatment/Interventions Cryotherapy;Electrical Stimulation;Iontophoresis 49m/ml Dexamethasone;Ultrasound;Moist Heat;Therapeutic activities;Therapeutic exercise;Neuromuscular re-education;Patient/family education;Passive range of motion;Manual techniques;Dry needling;Taping   PT Next Visit Plan continue RTC strengthening, ROM as tolerated and ionto #5;    PT Home Exercise Plan progress as needed   Consulted and Agree with Plan of Care Patient      Patient will benefit  from skilled therapeutic intervention in order to  improve the following deficits and impairments:  Decreased range of motion, Increased fascial restricitons, Decreased endurance, Increased muscle spasms, Decreased activity tolerance, Pain, Hypomobility, Decreased strength, Decreased mobility  Visit Diagnosis: Chronic left shoulder pain  Stiffness of left shoulder, not elsewhere classified  Muscle weakness (generalized)     Problem List Patient Active Problem List   Diagnosis Date Noted  . Chronic diastolic heart failure ([place]) 10/31/2016  . Severe obesity (BMI 35.0-39.9) with comorbidity ([place]) 08/12/2016  . [place] DISEASE, LUMBAR 06/16/2009  . TRANSVERSE MYELITIS 05/01/2009  . PROSTATITIS, ACUTE 04/17/2009  . NUMBNESS 04/17/2009  . Allergic rhinitis 03/12/2009  . Type 2 diabetes mellitus with insulin therapy ([place]) 02/26/2007  . Hyperlipidemia, unspecified 02/26/2007  . OVERWEIGHT 02/26/2007  . Essential hypertension 02/26/2007    Earlie Counts, PT 01/19/17 4:57 PM   [facility] [address], [place], [place], [address] Phone: [phone]   Fax:  [phone]  Name: Rino Hosea MRN: [id number] Date of Birth: 18-Jul-1966

## 2017-01-24 ENCOUNTER — Encounter: Payer: Self-pay | Admitting: Physical Therapy

## 2017-01-24 ENCOUNTER — Ambulatory Visit: Payer: 59 | Admitting: Physical Therapy

## 2017-01-24 DIAGNOSIS — M6281 Muscle weakness (generalized): Secondary | ICD-10-CM

## 2017-01-24 DIAGNOSIS — M25512 Pain in left shoulder: Principal | ICD-10-CM

## 2017-01-24 DIAGNOSIS — M25612 Stiffness of left shoulder, not elsewhere classified: Secondary | ICD-10-CM

## 2017-01-24 DIAGNOSIS — G8929 Other chronic pain: Secondary | ICD-10-CM

## 2017-01-24 NOTE — Therapy (Signed)
[facility] [address] [place], [place], [address] Phone: [phone]   Fax:  [phone]  Physical Therapy Treatment  Patient Details  Name: Tyler Aguirre MRN: [id number] Date of Birth: 1966-01-27 Referring Provider: Dr. Betty Martinique  Encounter Date: 01/24/2017      PT End of Session - 01/24/17 2031    Visit Number 8   Date for PT Re-Evaluation 02/21/17   Authorization - Visit Number 8   Authorization - Number of Visits 23   PT Start Time 1275   PT Stop Time 1655   PT Time Calculation (min) 40 min   Activity Tolerance Patient tolerated treatment well   Behavior During Therapy [facility] for tasks assessed/performed      Past Medical History:  Diagnosis Date  . ALLERGIC RHINITIS 03/12/2009  . DIABETES MELLITUS, TYPE II 02/26/2007  . [place] DISEASE, LUMBAR 06/16/2009  . HYPERLIPIDEMIA 02/26/2007  . HYPERTENSION 02/26/2007  . NUMBNESS 04/17/2009  . Overweight(278.02) 02/26/2007  . PROSTATITIS, ACUTE 04/17/2009  . 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 COLONOSCOPY    . DENTAL SURGERY    . POLYPECTOMY      There were no vitals filed for this visit.      Subjective Assessment - 01/24/17 1623    Subjective today I was pulling on a cable and my shoulder popped. I had pain initially but now none.  I had no pain over the weekend.    Patient Stated Goals reduce pain   Currently in Pain? No/denies                         [facility] Adult PT Treatment/Exercise - 01/24/17 0001      Shoulder Exercises: Seated   External Rotation Strengthening;Left;10 reps;Weights  arm at 90 degrees abduction; 3 sets   External Rotation Weight (lbs) 2   Flexion Left;Strengthening;10 reps  no weight   Flexion Weight (lbs) 1  10x; 2# 10x   Abduction Left;Strengthening;10 reps  no weight   ABduction Weight (lbs) 1  5x2     Shoulder Exercises: Standing   Other Standing  Exercises wall push ups  cues to keep upper traps relaxed   Other Standing Exercises roll prickly ball out to side 20 x      Shoulder Exercises: ROM/Strengthening   UBE (Upper Arm Bike) L3 x 5 min reverse   Other ROM/Strengthening Exercises shoulder abduction UE ranger level 27 20x     Shoulder Exercises: Power Development worker, community 20 reps  25#   Row 20 reps  25#   Row Limitations VC on moving scapula to spine   External Rotation 20 reps  15#   External Rotation Limitations VC to move scapula to spine   Internal Rotation 20 reps  15#                PT Education - 01/24/17 2031    Education provided No          PT Short Term Goals - 01/09/17 1618      PT SHORT TERM GOAL #3   Title raise left arm out to side to put a coat on with pain decreased >/= 25%   Time 4   Period Weeks   Status Achieved  60% better     PT SHORT TERM GOAL #4   Title pain with laying on left shoulder decreased >/= 25%  Time 4   Period Weeks   Status Achieved  60% better           PT Long Term Goals - 01/24/17 1624      PT LONG TERM GOAL #1   Title independent with HEP   Time 8   Period Weeks   Status On-going     PT LONG TERM GOAL #2   Title sleep through the night and not wake up due to shoulder pain   Time 8   Period Weeks   Status Achieved     PT LONG TERM GOAL #3   Title raise left arm overhead to work on cables for work with pain decreased >/= 75% and left shoulder fleixon and abduction AROM >/= 140 degrees   Time 8   Period Weeks   Status Achieved     PT LONG TERM GOAL #4   Title reach behind to get and item with left arm due to left shoulder strength >/= 4/5   Time 8   Period Weeks   Status Achieved     PT LONG TERM GOAL #5   Title taking off a pull over top with pain decreased >/= 75%   Time 8   Period Weeks   Status Achieved     PT LONG TERM GOAL #6   Title putting a belt on with left arm with pain decreased >/= 75% due to interal rotation increased     Time 8   Period Weeks   Status Achieved     PT LONG TERM GOAL #7   Title FOTO score </= 30% limitation   Time 8   Period Weeks   Status On-going               Plan - 01/24/17 2032    Clinical Impression Statement Patient had his first weekend without pain. Patient is able to do left shoulder abduction with 1# 5 times compared to 1 time.  Patient continues to have difficulty with left shoulder abduction.  Patient has difficulty with depressing his left scapula with shoulder external rotation and abduction.  Patient will benefit from skilled therapy to increase strength and reduce pain.    Rehab Potential Excellent   Clinical Impairments Affecting Rehab Potential none   PT Frequency 2x / week   PT Duration 8 weeks   PT Treatment/Interventions Cryotherapy;Electrical Stimulation;Iontophoresis 26m/ml Dexamethasone;Ultrasound;Moist Heat;Therapeutic activities;Therapeutic exercise;Neuromuscular re-education;Patient/family education;Passive range of motion;Manual techniques;Dry needling;Taping   PT Next Visit Plan work on left shoulder abduction and midrange strength   PT Home Exercise Plan progress as needed   Consulted and Agree with Plan of Care Patient      Patient will benefit from skilled therapeutic intervention in order to improve the following deficits and impairments:  Decreased range of motion, Increased fascial restricitons, Decreased endurance, Increased muscle spasms, Decreased activity tolerance, Pain, Hypomobility, Decreased strength, Decreased mobility  Visit Diagnosis: Chronic left shoulder pain  Stiffness of left shoulder, not elsewhere classified  Muscle weakness (generalized)     Problem List Patient Active Problem List   Diagnosis Date Noted  . Chronic diastolic heart failure ([place] 10/31/2016  . Severe obesity (BMI 35.0-39.9) with comorbidity ([place] 08/12/2016  . [place]EASE, LUMBAR 06/16/2009  . TRANSVERSE MYELITIS 05/01/2009  . PROSTATITIS, ACUTE  04/17/2009  . NUMBNESS 04/17/2009  . Allergic rhinitis 03/12/2009  . Type 2 diabetes mellitus with insulin therapy (HUnity 02/26/2007  . Hyperlipidemia, unspecified 02/26/2007  . OVERWEIGHT 02/26/2007  . Essential hypertension 02/26/2007  Earlie Counts, PT 01/24/17 8:39 PM   [facility] [address], [place], [place], [address] Phone: [phone]   Fax:  [phone]  Name: Tyler Aguirre MRN: [id number] Date of Birth: [birth year]

## 2017-01-26 ENCOUNTER — Ambulatory Visit: Payer: 59 | Admitting: Physical Therapy

## 2017-01-26 ENCOUNTER — Encounter: Payer: 59 | Admitting: Physical Therapy

## 2017-01-26 ENCOUNTER — Encounter: Payer: Self-pay | Admitting: Physical Therapy

## 2017-01-26 DIAGNOSIS — M25612 Stiffness of left shoulder, not elsewhere classified: Secondary | ICD-10-CM

## 2017-01-26 DIAGNOSIS — G8929 Other chronic pain: Secondary | ICD-10-CM

## 2017-01-26 DIAGNOSIS — M6281 Muscle weakness (generalized): Secondary | ICD-10-CM

## 2017-01-26 DIAGNOSIS — M25512 Pain in left shoulder: Secondary | ICD-10-CM | POA: Diagnosis not present

## 2017-01-26 NOTE — Therapy (Signed)
[facility] [address], [place], [place], [address] Phone: [phone]   Fax:  [phone]  Physical Therapy Treatment  Patient Details  Name: Tyler Aguirre MRN: [id number] Date of Birth: 1966/02/25 Referring Provider: Dr. Betty Martinique  Encounter Date: 01/26/2017      PT End of Session - 01/26/17 1657    Visit Number 9   Date for PT Re-Evaluation 02/21/17   Authorization - Visit Number 9   Authorization - Number of Visits 23   PT Start Time 4562   PT Stop Time 1655   PT Time Calculation (min) 40 min   Activity Tolerance Patient tolerated treatment well   Behavior During Therapy [facility] for tasks assessed/performed      Past Medical History:  Diagnosis Date  . ALLERGIC RHINITIS 03/12/2009  . DIABETES MELLITUS, TYPE II 02/26/2007  . [place] DISEASE, LUMBAR 06/16/2009  . HYPERLIPIDEMIA 02/26/2007  . HYPERTENSION 02/26/2007  . NUMBNESS 04/17/2009  . Overweight(278.02) 02/26/2007  . PROSTATITIS, ACUTE 04/17/2009  . 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 COLONOSCOPY    . DENTAL SURGERY    . POLYPECTOMY      There were no vitals filed for this visit.      Subjective Assessment - 01/26/17 1621    Subjective I feel good. No pain today.  I had to crawl in an attic and felt fine.    Patient Stated Goals reduce pain   Currently in Pain? No/denies                         [facility] Adult PT Treatment/Exercise - 01/26/17 0001      Exercises   Exercises Other Exercises   Other Exercises  quadruped lift right arm to horizontal abduction 10x; quadruped with hands on flat side of BOSU moving back and forth; modified pland with elbows on BOSU ball hold 30 sec then move buttocks up and down     Shoulder Exercises: Seated   Flexion Left;Strengthening;10 reps;Weights  2 sets   Flexion Weight (lbs) 2   Abduction Strengthening;Left;20 reps     Shoulder Exercises:  Prone   Flexion Left;Strengthening;10 reps  2 sets   External Rotation Strengthening;Left;10 reps;Weights   External Rotation Weight (lbs) 1  2x10   External Rotation Limitations 10x no weight   Horizontal ABduction 1 Strengthening;Left;10 reps   Horizontal ABduction 1 Limitations 10x palm down then 10x thumb up   Other Prone Exercises Y motion 10X palm down then thumb up   Other Prone Exercises row 6# 3x10 withtactile cues to bring scapula to spine     Shoulder Exercises: ROM/Strengthening   Other ROM/Strengthening Exercises shoulder abduction UE ranger level 27 10x, 10x 1#,, 10x 2#, 10x 3#     Shoulder Exercises: Body Blade   Flexion 5 reps  moving side to side   Other Body Blade Exercises bil. shoulder flexion from 90 degrees to 160 15x                PT Education - 01/26/17 1657    Education provided No          PT Short Term Goals - 01/09/17 1618      PT SHORT TERM GOAL #3   Title raise left arm out to side to put a coat on with pain decreased >/= 25%   Time 4   Period Weeks   Status Achieved  60% better     PT SHORT TERM GOAL #4   Title pain with laying on left shoulder decreased >/= 25%   Time 4   Period Weeks   Status Achieved  60% better           PT Long Term Goals - 01/24/17 1624      PT LONG TERM GOAL #1   Title independent with HEP   Time 8   Period Weeks   Status On-going     PT LONG TERM GOAL #2   Title sleep through the night and not wake up due to shoulder pain   Time 8   Period Weeks   Status Achieved     PT LONG TERM GOAL #3   Title raise left arm overhead to work on cables for work with pain decreased >/= 75% and left shoulder fleixon and abduction AROM >/= 140 degrees   Time 8   Period Weeks   Status Achieved     PT LONG TERM GOAL #4   Title reach behind to get and item with left arm due to left shoulder strength >/= 4/5   Time 8   Period Weeks   Status Achieved     PT LONG TERM GOAL #5   Title taking off a pull  over top with pain decreased >/= 75%   Time 8   Period Weeks   Status Achieved     PT LONG TERM GOAL #6   Title putting a belt on with left arm with pain decreased >/= 75% due to interal rotation increased    Time 8   Period Weeks   Status Achieved     PT LONG TERM GOAL #7   Title FOTO score </= 30% limitation   Time 8   Period Weeks   Status On-going             Patient will benefit from skilled therapeutic intervention in order to improve the following deficits and impairments:     Visit Diagnosis: Chronic left shoulder pain  Stiffness of left shoulder, not elsewhere classified  Muscle weakness (generalized)     Problem List Patient Active Problem List   Diagnosis Date Noted  . Chronic diastolic heart failure ([place]) 10/31/2016  . Severe obesity (BMI 35.0-39.9) with comorbidity ([place]) 08/12/2016  . [place] DISEASE, LUMBAR 06/16/2009  . TRANSVERSE MYELITIS 05/01/2009  . PROSTATITIS, ACUTE 04/17/2009  . NUMBNESS 04/17/2009  . Allergic rhinitis 03/12/2009  . Type 2 diabetes mellitus with insulin therapy ([place]) 02/26/2007  . Hyperlipidemia, unspecified 02/26/2007  . OVERWEIGHT 02/26/2007  . Essential hypertension 02/26/2007    Earlie Counts, PT 01/26/17 5:01 PM   [facility] [address], [place], [place], [address] Phone: [phone]   Fax:  [phone]  Name: Tyler Aguirre MRN: [id number] Date of Birth: 25-Jun-1966

## 2017-01-31 ENCOUNTER — Ambulatory Visit: Payer: 59 | Admitting: Physical Therapy

## 2017-01-31 ENCOUNTER — Encounter: Payer: Self-pay | Admitting: Physical Therapy

## 2017-01-31 DIAGNOSIS — G8929 Other chronic pain: Secondary | ICD-10-CM

## 2017-01-31 DIAGNOSIS — M25512 Pain in left shoulder: Secondary | ICD-10-CM

## 2017-01-31 DIAGNOSIS — M25612 Stiffness of left shoulder, not elsewhere classified: Secondary | ICD-10-CM

## 2017-01-31 DIAGNOSIS — M6281 Muscle weakness (generalized): Secondary | ICD-10-CM

## 2017-01-31 NOTE — Therapy (Signed)
[facility] [address], [place], [place], [address] Phone: [phone]   Fax:  [phone]  Physical Therapy Treatment  Patient Details  Name: Tyler Aguirre MRN: [id number] Date of Birth: 1966-09-08 Referring Provider: Dr. Betty Martinique  Encounter Date: 01/31/2017      PT End of Session - 01/31/17 1702    Visit Number 10   Date for PT Re-Evaluation 02/21/17   Authorization - Visit Number 10   Authorization - Number of Visits 23   PT Start Time 0017   PT Stop Time 1700   PT Time Calculation (min) 45 min   Activity Tolerance Patient tolerated treatment well   Behavior During Therapy [facility] for tasks assessed/performed      Past Medical History:  Diagnosis Date  . ALLERGIC RHINITIS 03/12/2009  . DIABETES MELLITUS, TYPE II 02/26/2007  . [place] DISEASE, LUMBAR 06/16/2009  . HYPERLIPIDEMIA 02/26/2007  . HYPERTENSION 02/26/2007  . NUMBNESS 04/17/2009  . Overweight(278.02) 02/26/2007  . PROSTATITIS, ACUTE 04/17/2009  . 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 COLONOSCOPY    . DENTAL SURGERY    . POLYPECTOMY      There were no vitals filed for this visit.      Subjective Assessment - 01/31/17 1623    Subjective I have tightness in shoulder when I wake up.  I have no pain today.    Patient Stated Goals reduce pain   Currently in Pain? No/denies                         [facility] Adult PT Treatment/Exercise - 01/31/17 0001      Exercises   Exercises Other Exercises   Other Exercises  quadruped lift right arm to horizontal abduction 10x; quadruped with hands on flat side of BOSU moving back and forth; modified pland with elbows on BOSU ball hold 30 sec then move buttocks up and down     Shoulder Exercises: Seated   External Rotation Strengthening;Left;10 reps;Weights  arm at 90 degrees abduction; 3 sets   External Rotation Weight (lbs) 2   External Rotation  Limitations 10 3#   Flexion Left;Strengthening;10 reps;Weights  2 sets   Flexion Weight (lbs) 2  3# 2x10   Abduction 15 reps;Strengthening;Left  1 sets ; 10 x1no weight     Shoulder Exercises: Prone   Flexion Left;Strengthening;10 reps  2 sets   Extension Strengthening;Left;10 reps;Weights  2 sets of 10   Extension Weight (lbs) 2   Horizontal ABduction 1 Strengthening;Left;10 reps   Horizontal ABduction 1 Limitations 10x palm down then 10x thumb up   Other Prone Exercises Y motion 10X palm down then thumb up   Other Prone Exercises row 6# 3x10 withtactile cues to bring scapula to spine     Shoulder Exercises: ROM/Strengthening   Other ROM/Strengthening Exercises shoulder abduction UE ranger level 27  10x 2#, 10x 3#                  PT Short Term Goals - 01/09/17 1618      PT SHORT TERM GOAL #3   Title raise left arm out to side to put a coat on with pain decreased >/= 25%   Time 4   Period Weeks   Status Achieved  60% better     PT SHORT TERM GOAL #4   Title pain with laying on left shoulder decreased >/= 25%  Time 4   Period Weeks   Status Achieved  60% better           PT Long Term Goals - 01/31/17 1704      PT LONG TERM GOAL #1   Title independent with HEP   Time 8   Period Weeks   Status On-going     PT LONG TERM GOAL #2   Title sleep through the night and not wake up due to shoulder pain   Period Weeks   Status Achieved     PT LONG TERM GOAL #3   Title raise left arm overhead to work on cables for work with pain decreased >/= 75% and left shoulder fleixon and abduction AROM >/= 140 degrees   Time 8   Period Weeks   Status Achieved     PT LONG TERM GOAL #4   Title reach behind to get and item with left arm due to left shoulder strength >/= 4/5   Time 8   Status Achieved     PT LONG TERM GOAL #5   Title taking off a pull over top with pain decreased >/= 75%   Period Weeks   Status Achieved     PT LONG TERM GOAL #6   Title  putting a belt on with left arm with pain decreased >/= 75% due to interal rotation increased    Time 8   Period Weeks   Status Achieved     PT LONG TERM GOAL #7   Title FOTO score </= 30% limitation   Time 8   Period Weeks   Status On-going               Plan - 01/31/17 1703    Clinical Impression Statement Patient has trouble with abduction. Patient has adjusted his seating at his desk to not elevate his shoulders.  Patient is able to progress weights for shoulder strength.  Patient came to therapy without pain.    Rehab Potential Excellent   Clinical Impairments Affecting Rehab Potential none   PT Frequency 2x / week   PT Duration 8 weeks   PT Treatment/Interventions Cryotherapy;Electrical Stimulation;Iontophoresis 39m/ml Dexamethasone;Ultrasound;Moist Heat;Therapeutic activities;Therapeutic exercise;Neuromuscular re-education;Patient/family education;Passive range of motion;Manual techniques;Dry needling;Taping   PT Next Visit Plan work on left shoulder abduction and midrange strength   PT Home Exercise Plan progress as needed   Consulted and Agree with Plan of Care Patient      Patient will benefit from skilled therapeutic intervention in order to improve the following deficits and impairments:  Decreased range of motion, Increased fascial restricitons, Decreased endurance, Increased muscle spasms, Decreased activity tolerance, Pain, Hypomobility, Decreased strength, Decreased mobility  Visit Diagnosis: Stiffness of left shoulder, not elsewhere classified  Chronic left shoulder pain  Muscle weakness (generalized)     Problem List Patient Active Problem List   Diagnosis Date Noted  . Chronic diastolic heart failure ([place] 10/31/2016  . Severe obesity (BMI 35.0-39.9) with comorbidity ([place]idd[place] 08/12/2016  . [place]DISEASE, LUMBAR 06/16/2009  . TRANSVERSE MYELITIS 05/01/2009  . PROSTATITIS, ACUTE 04/17/2009  . NUMBNESS 04/17/2009  . Allergic rhinitis 03/12/2009  .  Type 2 diabetes mellitus with insulin therapy ([place] 02/26/2007  . Hyperlipidemia, unspecified 02/26/2007  . OVERWEIGHT 02/26/2007  . Essential hypertension 02/26/2007    CEarlie Counts PT 01/31/17 5:05 PM    [facility] [address] [place] [address]: [phone]  Fax:  [phone] Name: Tyler Aguirre  MRN: [id number] Date of Birth: [birth year]

## 2017-02-02 ENCOUNTER — Ambulatory Visit: Payer: 59 | Admitting: Physical Therapy

## 2017-02-02 ENCOUNTER — Encounter: Payer: Self-pay | Admitting: Physical Therapy

## 2017-02-02 DIAGNOSIS — M25512 Pain in left shoulder: Secondary | ICD-10-CM | POA: Diagnosis not present

## 2017-02-02 DIAGNOSIS — M6281 Muscle weakness (generalized): Secondary | ICD-10-CM

## 2017-02-02 DIAGNOSIS — G8929 Other chronic pain: Secondary | ICD-10-CM

## 2017-02-02 DIAGNOSIS — M25612 Stiffness of left shoulder, not elsewhere classified: Secondary | ICD-10-CM

## 2017-02-02 NOTE — Therapy (Signed)
[facility] [address], [place], [place], [address] Phone: [phone]   Fax:  [phone]  Physical Therapy Treatment  Patient Details  Name: Tyler Aguirre MRN: [id number] Date of Birth: 05/19/1966 Referring Provider: Dr. Betty Martinique  Encounter Date: 02/02/2017      PT End of Session - 02/02/17 1623    Visit Number 11   Date for PT Re-Evaluation 02/21/17   Authorization - Visit Number 11   Authorization - Number of Visits 23   PT Start Time 1620   PT Stop Time 1700   PT Time Calculation (min) 40 min   Activity Tolerance Patient tolerated treatment well   Behavior During Therapy [facility] for tasks assessed/performed      Past Medical History:  Diagnosis Date  . ALLERGIC RHINITIS 03/12/2009  . DIABETES MELLITUS, TYPE II 02/26/2007  . [place] Agu[place] DISEASE, LUMBAR 06/16/2009  . HYPERLIPIDEMIA 02/26/2007  . HYPERTENSION 02/26/2007  . NUMBNESS 04/17/2009  . Overweight(278.02) 02/26/2007  . PROSTATITIS, ACUTE 04/17/2009  . 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 COLONOSCOPY    . DENTAL SURGERY    . POLYPECTOMY      There were no vitals filed for this visit.      Subjective Assessment - 02/02/17 1622    Subjective I don't have any pain in the shoulder today.     Currently in Pain? No/denies                         [facility] Adult PT Treatment/Exercise - 02/02/17 0001      Shoulder Exercises: Sidelying   External Rotation Strengthening;Left;Weights  15# 30x   External Rotation Limitations external rotation in scaption - 1plate# - 22W   Flexion Strengthening;Right;Left;15 reps;Weights  4# - flexion, scaption, abduction     Shoulder Exercises: ROM/Strengthening   UBE (Upper Arm Bike) L3 x 5 min reverse     Shoulder Exercises: Stretch   Corner Stretch 3 reps;20 seconds   Other Shoulder Stretches knee planks 4x30 sec hold     Shoulder Exercises: Power Teacher, English as a foreign language --  45# 2x15reps   Row 15 reps  25# x 15, 30# x 15, 45# x 15   Row Limitations VC on moving scapula to spine     Shoulder Exercises: Body Blade   Other Body Blade Exercises shoulder flexion 90 and 180 - 2 ways 30sec each way                  PT Short Term Goals - 01/09/17 1618      PT SHORT TERM GOAL #3   Title raise left arm out to side to put a coat on with pain decreased >/= 25%   Time 4   Period Weeks   Status Achieved  60% better     PT SHORT TERM GOAL #4   Title pain with laying on left shoulder decreased >/= 25%   Time 4   Period Weeks   Status Achieved  60% better           PT Long Term Goals - 01/31/17 1704      PT LONG TERM GOAL #1   Title independent with HEP   Time 8   Period Weeks   Status On-going     PT LONG TERM GOAL #2   Title sleep through the night and not wake up due to shoulder  pain   Period Weeks   Status Achieved     PT LONG TERM GOAL #3   Title raise left arm overhead to work on cables for work with pain decreased >/= 75% and left shoulder fleixon and abduction AROM >/= 140 degrees   Time 8   Period Weeks   Status Achieved     PT LONG TERM GOAL #4   Title reach behind to get and item with left arm due to left shoulder strength >/= 4/5   Time 8   Status Achieved     PT LONG TERM GOAL #5   Title taking off a pull over top with pain decreased >/= 75%   Period Weeks   Status Achieved     PT LONG TERM GOAL #6   Title putting a belt on with left arm with pain decreased >/= 75% due to interal rotation increased    Time 8   Period Weeks   Status Achieved     PT LONG TERM GOAL #7   Title FOTO score </= 30% limitation   Time 8   Period Weeks   Status On-going               Plan - 02/02/17 1705    Clinical Impression Statement Patient able to progress with increased resistance today.  Pt doing well with exercises, needed minimal cues for reduced upper trap activation.  Continues to have no pain in  shoulder today. Did have soreness after previous treatment.   Rehab Potential Excellent   PT Treatment/Interventions Cryotherapy;Electrical Stimulation;Iontophoresis 67m/ml Dexamethasone;Ultrasound;Moist Heat;Therapeutic activities;Therapeutic exercise;Neuromuscular re-education;Patient/family education;Passive range of motion;Manual techniques;Dry needling;Taping   PT Next Visit Plan left shoulder abduction and midrange strength, ensure finalized HEP, plan for discharge   Consulted and Agree with Plan of Care Patient      Patient will benefit from skilled therapeutic intervention in order to improve the following deficits and impairments:  Decreased range of motion, Increased fascial restricitons, Decreased endurance, Increased muscle spasms, Decreased activity tolerance, Pain, Hypomobility, Decreased strength, Decreased mobility  Visit Diagnosis: Stiffness of left shoulder, not elsewhere classified  Chronic left shoulder pain  Muscle weakness (generalized)     Problem List Patient Active Problem List   Diagnosis Date Noted  . Chronic diastolic heart failure (HImpact 10/31/2016  . Severe obesity (BMI 35.0-39.9) with comorbidity (HH[place] 08/12/2016  . DH[place]DISEASE, LUMBAR 06/16/2009  . TRANSVERSE MYELITIS 05/01/2009  . PROSTATITIS, ACUTE 04/17/2009  . NUMBNESS 04/17/2009  . Allergic rhinitis 03/12/2009  . Type 2 diabetes mellitus with insulin therapy ([place] 02/26/2007  . Hyperlipidemia, unspecified 02/26/2007  . OVERWEIGHT 02/26/2007  . Essential hypertension 02/26/2007    JZannie Cove PT 02/02/2017, 5:19 PM  [facility] [address] [place] [address]Phone: [phone]  Fax:  [phone] Name: Tyler DrumwrightMRN: [id number] of Birth: [birth year]

## 2017-02-09 ENCOUNTER — Encounter: Payer: Self-pay | Admitting: Physical Therapy

## 2017-02-09 ENCOUNTER — Ambulatory Visit: Payer: 59 | Admitting: Physical Therapy

## 2017-02-09 DIAGNOSIS — G8929 Other chronic pain: Secondary | ICD-10-CM

## 2017-02-09 DIAGNOSIS — M25612 Stiffness of left shoulder, not elsewhere classified: Secondary | ICD-10-CM

## 2017-02-09 DIAGNOSIS — M25512 Pain in left shoulder: Secondary | ICD-10-CM | POA: Diagnosis not present

## 2017-02-09 DIAGNOSIS — M6281 Muscle weakness (generalized): Secondary | ICD-10-CM

## 2017-02-09 NOTE — Patient Instructions (Addendum)
Abduction (Dumbbell)    Stand with arms at sides. Lift arms out from sides, palms down. Repeat _10___ times per set. Do __2__ sets per session. Do __1__ sessions per week. Use _1___ lb weights in left hand  Then go through full motion with weight in each hand together. 2x10.  Copyright  VHI. All rights reserved.     Progressive Resisted: Flexion (Standing)    Holding _2___ pound weights, raise arms toward ceiling. Keep elbows straight. Repeat __10__ times per set. Do __2__ sets per session. Do _1___ sessions per day.  http://orth.exer.us/870   Copyright  VHI. All rights reserved.  Scapular Retraction: Flexion (Prone)    Lie with arms forward. Pinch shoulder blades together and raise arms a few inches from floor. Repeat _10___ times per set. Do _1___ sets per session. Do _1___ sessions per day. Kneeling on bench http://orth.exer.us/960   Copyright  VHI. All rights reserved.  Scapular Retraction: Abduction (Prone)    Lie with upper arms straight out from sides, elbows bent to 90. Pinch shoulder blades together and raise arms a few inches from floor. Repeat __10__ times per set. Do _1___ sets per session. Do __1__ sessions per day.  http://orth.exer.us/956   Copyright  VHI. All rights reserved.  Scapular Retraction: Abduction / Extension (Prone)    Lie with arms out from sides 90. Pinch shoulder blades together and raise arms a few inches from floor. Repeat _10___ times per set. Do __1__ sets per session. Do _1___ sessions per day.  http://orth.exer.us/958   Copyright  VHI. All rights reserved.  Strengthening: Resisted Extension    Hold pulleys in both hands, arm forward. Pull arms back, elbow straight. #25. Repeat _10___ times per set. Do __2__ sets per session. Do __1__ sessions per day. Pinch shoulder blades together.  http://orth.exer.us/832   Copyright  VHI. All rights reserved.  Strengthening: Scaption - with External Rotation    Holding  _1-2___ pound weight, raise right arm diagonally from hip to above head. Keep elbow straight, thumb up. Repeat ___10_ times per set. Do __2__ sets per session. Do _1___ sessions per day.  http://orth.exer.us/892   Copyright  VHI. All rights reserved.   Rowing machine start 5-10 min low level  In PLANK position move buttocks up and down 5-10 times.   [facility] [address], [address] [place], [place] [address] Phone # [phone] Fax [phone]

## 2017-02-09 NOTE — Therapy (Signed)
[facility] [address] [place], [place], [address] Phone: [phone]   Fax:  [phone]  Physical Therapy Treatment  Patient Details  Name: Tyler Aguirre MRN: [id number] Date of Birth: [birth year] Referring Provider: Dr. Betty Martinique  Encounter Date: 02/09/2017      PT End of Session - 02/09/17 1525    Visit Number 12   Date for PT Re-Evaluation 02/21/17   Authorization - Visit Number 12   Authorization - Number of Visits 23   PT Start Time 3875   PT Stop Time 1526   PT Time Calculation (min) 39 min   Activity Tolerance Patient tolerated treatment well   Behavior During Therapy [facility] for tasks assessed/performed      Past Medical History:  Diagnosis Date  . ALLERGIC RHINITIS 03/12/2009  . DIABETES MELLITUS, TYPE II 02/26/2007  . [place] DISEASE, LUMBAR 06/16/2009  . HYPERLIPIDEMIA 02/26/2007  . HYPERTENSION 02/26/2007  . NUMBNESS 04/17/2009  . Overweight(278.02) 02/26/2007  . PROSTATITIS, ACUTE 04/17/2009  . 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 COLONOSCOPY    . DENTAL SURGERY    . POLYPECTOMY      There were no vitals filed for this visit.      Subjective Assessment - 02/09/17 1453    Subjective I am not having pain.  I feel like I am ready for discharge. I feel like I can strengthen at home.    Patient Stated Goals reduce pain   Currently in Pain? 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 Betty Martinique   Onset Date/Surgical Date 12/23/15   Hand Dominance Right   Prior Therapy none     Precautions   Precautions None     Restrictions   Weight Bearing Restrictions No     Balance Screen   Has the patient fallen in the past 6 months No   Has the patient had a decrease in activity level because of a fear of falling?  No   Is  the patient reluctant to leave their home because of a fear of falling?  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     Prior Function   Level of Independence Independent with basic ADLs   Vocation Full time employment   Vocation Requirements computer work, run Ecologist   Overall Cognitive Status Within Functional Limits for tasks assessed     Observation/Other Assessments   Focus on Therapeutic Outcomes (FOTO)  23% limitation     Posture/Postural Control   Posture/Postural Control No significant limitations     AROM   Left Shoulder Flexion 150 Degrees   Left Shoulder ABduction 150 Degrees   Left Shoulder Internal Rotation 80 Degrees  L1   Left Shoulder External Rotation 70 Degrees     Strength   Left Shoulder Flexion 4+/5   Left Shoulder Extension 4+/5   Left Shoulder ABduction 4/5   Left Shoulder Internal Rotation 5/5   Left Shoulder External Rotation 5/5                     [facility] Adult PT Treatment/Exercise - 02/09/17 0001      Shoulder Exercises: ROM/Strengthening   UBE (Upper Arm  Bike) L3 x 5 min reverse                PT Education - 02/09/17 1525    Education provided Yes   Education Details HEP to strengthen shoulder at the gym   Methods Explanation;Demonstration;Handout   Comprehension Returned demonstration;Verbalized understanding          PT Short Term Goals - 01/09/17 1618      PT SHORT TERM GOAL #3   Title raise left arm out to side to put a coat on with pain decreased >/= 25%   Time 4   Period Weeks   Status Achieved  60% better     PT SHORT TERM GOAL #4   Title pain with laying on left shoulder decreased >/= 25%   Time 4   Period Weeks   Status Achieved  60% better           PT Long Term Goals - 02/09/17 1528      PT LONG TERM GOAL #1   Title independent with HEP   Time 8   Period Weeks   Status Achieved     PT LONG TERM GOAL #2   Title sleep through the night and not  wake up due to shoulder pain   Time 8   Period Weeks   Status Achieved     PT LONG TERM GOAL #3   Title raise left arm overhead to work on cables for work with pain decreased >/= 75% and left shoulder fleixon and abduction AROM >/= 140 degrees   Time 8   Period Weeks   Status Achieved     PT LONG TERM GOAL #4   Title reach behind to get and item with left arm due to left shoulder strength >/= 4/5   Period Weeks   Status Achieved     PT LONG TERM GOAL #5   Title taking off a pull over top with pain decreased >/= 75%   Time 8   Period Weeks   Status Achieved     PT LONG TERM GOAL #6   Title putting a belt on with left arm with pain decreased >/= 75% due to interal rotation increased    Time 8   Period Weeks   Status Achieved     PT LONG TERM GOAL #7   Title FOTO score </= 30% limitation   Time 8   Period Weeks   Status Achieved               Plan - 02/09/17 1526    Clinical Impression Statement Patient has met all of his goals.  Patient has full left shoulder ROM.  Patient does not have pain just stiffness sometimes.  FOTO score has improved to 23%.  Left shoulder is between 4-5/5 strength.  Patient will exercise at the gym to further progress his strength. Patient is ready for discharge.    Rehab Potential Excellent   Clinical Impairments Affecting Rehab Potential none   PT Treatment/Interventions Cryotherapy;Electrical Stimulation;Iontophoresis 61m/ml Dexamethasone;Ultrasound;Moist Heat;Therapeutic activities;Therapeutic exercise;Neuromuscular re-education;Patient/family education;Passive range of motion;Manual techniques;Dry needling;Taping   PT Next Visit Plan discharge to HEP   PT Home Exercise Plan Current HEP   Consulted and Agree with Plan of Care Patient      Patient will benefit from skilled therapeutic intervention in order to improve the following deficits and impairments:  Decreased range of motion, Increased fascial restricitons, Decreased endurance,  Increased muscle spasms, Decreased activity tolerance, Pain, Hypomobility, Decreased  strength, Decreased mobility  Visit Diagnosis: Stiffness of left shoulder, not elsewhere classified  Chronic left shoulder pain  Muscle weakness (generalized)     Problem List Patient Active Problem List   Diagnosis Date Noted  . Chronic diastolic heart failure ([place]) 10/31/2016  . Severe obesity (BMI 35.0-39.9) with comorbidity ([place]) 08/12/2016  . [place] DISEASE, LUMBAR 06/16/2009  . TRANSVERSE MYELITIS 05/01/2009  . PROSTATITIS, ACUTE 04/17/2009  . NUMBNESS 04/17/2009  . Allergic rhinitis 03/12/2009  . Type 2 diabetes mellitus with insulin therapy ([place]) 02/26/2007  . Hyperlipidemia, unspecified 02/26/2007  . OVERWEIGHT 02/26/2007  . Essential hypertension 02/26/2007    Earlie Counts, PT 02/09/17 3:30 PM   [facility] [address] [place], [place], [address] Phone: [phone]   Fax:  [phone]  Name: Tyler Aguirre MRN: [id number] Date of Birth: [birth year]  PHYSICAL THERAPY DISCHARGE SUMMARY  Visits from Start of Care: 12  Current functional level related to goals / functional outcomes: See above.   Remaining deficits: See above.   Education / Equipment: HEP Plan: Patient agrees to discharge.  Patient goals were met. Patient is being discharged due to meeting the stated rehab goals. Thank you for the referral. Earlie Counts, PT 02/09/17 3:30 PM    ?????

## 2017-02-27 ENCOUNTER — Encounter: Payer: Self-pay | Admitting: Internal Medicine

## 2017-02-27 ENCOUNTER — Ambulatory Visit (INDEPENDENT_AMBULATORY_CARE_PROVIDER_SITE_OTHER): Payer: 59 | Admitting: Internal Medicine

## 2017-02-27 VITALS — BP 160/84 | HR 74 | Ht 72.0 in | Wt 304.0 lb

## 2017-02-27 DIAGNOSIS — E119 Type 2 diabetes mellitus without complications: Secondary | ICD-10-CM

## 2017-02-27 DIAGNOSIS — Z794 Long term (current) use of insulin: Secondary | ICD-10-CM | POA: Diagnosis not present

## 2017-02-27 MED ORDER — INSULIN GLARGINE 300 UNIT/ML ~~LOC~~ SOPN: 60.0000 [IU] | PEN_INJECTOR | Freq: Every day | SUBCUTANEOUS | 2 refills | Status: DC

## 2017-02-27 MED ORDER — INSULIN LISPRO 100 UNIT/ML (KWIKPEN): PEN_INJECTOR | SUBCUTANEOUS | 1 refills | Status: DC

## 2017-02-27 NOTE — Progress Notes (Signed)
Patient ID: Tyler Aguirre, male   DOB: [birth year], 51 y.o.   MRN: [id number]   HPI: Tyler Aguirre is a 51 y.o.-year-old male, returning for follow-up for DM2, dx in the 1990s, insulin-dependent, uncontrolled, without complications. Last visit 3 mo ago. He previously saw an endocrinologist in [place], but wanted to switch care to [facility] endocrinology after he moved to [facility] summer 2017.  Last hemoglobin A1c was: Lab Results  Component Value Date   HGBA1C 7.0 11/28/2016   HGBA1C 6.6 (H) 08/22/2016   HGBA1C 6.9 (H) 01/22/2010   Pt was on a regimen of: - Lantus 50 units at bedtime - Novolog 40 units 3x a day, before or after meals - misses doses as he is busy at work He had to stop Invokana in the past because of constipation. He stopped Metformin 2/2 GI intolerance.  Now on: - Basaglar 50 units at bedtime - Humalog 10-15 min before a meal. - 30 units before a smaller meal - 40 units before a regular meal  - 50 units before a larger meal He could not tolerate Trulicity 3.71 mg weekly  >> nausea.  Pt checks his sugars 1-2x a day and they are still high: - am: 144-177, 247 >> 116, 129-197 >> 120-200s (later meals) - 2h after b'fast: n/c - before lunch: n/c >> 242 >> 130-160 - 2h after lunch: 154-289, 359 >> 350 >> n/c - before dinner: 97-203 >> 204-297 >> 200s (may have a snack - protein bar, etc.) - 2h after dinner: 181, 323 >> 327, 333 >> n/c - bedtime: n/c - nighttime: n/c >> 60 at 12 am + lows. Lowest sugar was 39 (woke him up) x1 - very active, small dinner >> 60 at 12 am >> 60s-70s; he has hypoglycemia awareness at 80.  Highest sugar was 360s >> 333 >> 260s  Glucometer: Verio  Diet: - b'fast: bisquit - lunch: Cut out fries, bu still has [employer] - snack: protein bar - dinner: takeout He is on the road most of the day, going to times houses. He mentions that he cannot take food with him and he has to rely on fast food.  - no CKD, last BUN/creatinine:   Lab Results  Component Value Date   BUN 15 08/22/2016   BUN 13 03/01/2016   CREATININE 0.68 08/22/2016   CREATININE 0.8 03/01/2016  On Avapro. He has a h/o high ACR >> resolved.  Lab Results  Component Value Date   MICRALBCREAT 1.9 08/22/2016   MICRALBCREAT 6.7 01/22/2010   MICRALBCREAT 4.1 09/02/2009   MICRALBCREAT 6.9 02/05/2009   MICRALBCREAT 13.9 11/30/2007   - last set of lipids: Lab Results  Component Value Date   CHOL 214 (H) 08/22/2016   HDL 46.00 08/22/2016   LDLCALC 150 (H) 08/22/2016   LDLDIRECT 147.8 01/22/2010   TRIG 92.0 08/22/2016   CHOLHDL 5 08/22/2016  On Zocor. - last eye exam was in 05/2016. No DR.  - no numbness and tingling in his feet.  He had an episode of presyncope when exercising in the park. A recent EKG, exercise stress test, 2D Echo (Dr. Sallyanne Kuster) >> normal >> cleared to start exercise again.   ROS: Constitutional: no weight gain, no fatigue, no subjective hyperthermia/hypothermia Eyes: no blurry vision, no xerophthalmia ENT: no sore throat, no nodules palpated in throat, no dysphagia/odynophagia, no hoarseness Cardiovascular: no CP/SOB/palpitations/leg swelling Respiratory: no cough/SOB Gastrointestinal: no N/V/D/C Musculoskeletal: no muscle/joint aches Skin: no rashes Neurological: no tremors/numbness/tingling/dizziness  I reviewed pt's medications,  allergies, PMH, social hx, family hx, and changes were documented in the history of present illness. Otherwise, unchanged from my initial visit note.  Past Medical History:  Diagnosis Date  . ALLERGIC RHINITIS 03/12/2009  . DIABETES MELLITUS, TYPE II 02/26/2007  . [place] DISEASE, LUMBAR 06/16/2009  . HYPERLIPIDEMIA 02/26/2007  . HYPERTENSION 02/26/2007  . NUMBNESS 04/17/2009  . Overweight(278.02) 02/26/2007  . PROSTATITIS, ACUTE 04/17/2009  . TRANSVERSE MYELITIS 05/01/2009   Past Surgical History:  Procedure Laterality Date  . COLONOSCOPY    . DENTAL SURGERY    . POLYPECTOMY     Social History    Social History  . Marital status: Single    Spouse name: N/A  . 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 Smoking status: Never Smoker  . Smokeless tobacco: Never Used  . Alcohol use Yes  . Drug use: No   Current Outpatient Prescriptions on File Prior to Visit  Medication Sig Dispense Refill  . [place] Lancet Super Thin 30G MISC by Does not apply route. As direceted     . azelastine (ASTELIN) 0.1 % nasal spray Place 2 sprays into both nostrils 2 (two) times daily. Use in each nostril as directed 30 mL 6  . Dulaglutide (TRULICITY) 0.01 VC/9.4WH SOPN Inject under skin 0.75 mg weekly 4 pen 1  . fluticasone (FLONASE) 50 MCG/ACT nasal spray Place 1 spray into both nostrils 2 (two) times daily. 16 g 3  . glucose blood test strip 1 each by Other route as needed. Use as instructed 100 each 3  . Insulin Glargine (BASAGLAR KWIKPEN) 100 UNIT/ML SOPN Inject 50 units into the skin at bedtime. (Patient taking differently: 30 Units. Inject 50 units into the skin at bedtime.) 15 pen 1  . insulin lispro (HUMALOG KWIKPEN) 100 UNIT/ML KiwkPen Inject 40-50 units into the skin 3 times daily before meals (Patient taking differently: Inject 40-50 units into the skin 3 times daily before meals) 45 pen 0  . irbesartan (AVAPRO) 150 MG tablet Take 1 tablet (150 mg total) by mouth daily. 30 tablet 11  . PROCTOSOL HC 2.5 % rectal cream APPLY RECTALLY TWICE DAILY 28.3 g 0  . Chlorphen-PE-Acetaminophen (CORICIDIN D COLD/FLU/SINUS PO) Take 2 tablets by mouth daily.    . furosemide (LASIX) 20 MG tablet Take 1 tablet (20 mg total) by mouth daily. 30 tablet 11   No current facility-administered medications on file prior to visit.    Allergies  Allergen Reactions  . Ibuprofen Other (See Comments)    Has to go to the bathroom all the time  . Metformin And Related Diarrhea    Unable to tolerate XL formulation.    Family History  Problem Relation  Age of Onset  . Heart disease Mother     valve dz  . Arthritis Mother     RA  . Anemia Mother   . Hypertension Father   . Heart disease Father   . Heart disease Maternal Uncle     CAD  . Diabetes Paternal Grandmother   . Obesity Paternal Grandmother   . Hypertension Paternal Grandmother   . Cancer Paternal Grandfather     lung  . Mental retardation Paternal Grandfather     suicided  . Hypertension Paternal Grandfather   . Diabetes Paternal Grandfather   . Obesity Maternal Grandfather   . Hypertension Maternal Grandfather   . Stroke Paternal Uncle   .  Cancer Paternal Uncle    PE: BP (!) 160/84 (BP Location: Left Arm, Patient Position: Sitting)   Pulse 74   Ht 6' (1.829 m)   Wt (!) 304 lb (137.9 kg)   SpO2 97%   BMI 41.23 kg/m  Wt Readings from Last 3 Encounters:  02/27/17 (!) 304 lb (137.9 kg)  12/01/16 (!) 300 lb 6.4 oz (136.3 kg)  11/28/16 297 lb (134.7 kg)   Constitutional: obese, in NAD Eyes: PERRLA, EOMI, no exophthalmos ENT: moist mucous membranes, no thyromegaly, no cervical lymphadenopathy Cardiovascular: RRR, No MRG Respiratory: CTA B Gastrointestinal: abdomen soft, NT, ND, BS+ Musculoskeletal: no deformities, strength intact in all 4 Skin: moist, warm, no rashes Neurological: no tremor with outstretched hands, DTR normal in all 4  ASSESSMENT: 1. DM2, insulin-dependent, uncontrolled, without complications  PLAN:  1. Patient with long-standing, uncontrolled diabetes, on basal-bolus insulin regimen, with previously frequent delayed meals, missed mealtime insulin doses and misadministration of insulin after rather than before meals "I am very busy". He Is doing a little better job with this, however, his diet is poor, relying mostly on fast food. 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 - We have tried metformin, GLP-1 receptor agonist, and SGLT2 but he could not  tolerate them inhibitor. In this case, especially since he is so insulin resistant, the only option is to increase his insulin. We have to be careful since he has had mild hypoglycemia at night. He only had it, and these were mild. He will probably require U500 insulin soon if he does not start changing his diet drastically. - HbA1c today 8.1% (higher) - I suggested to:  Patient Instructions  Please increase: - Lantus or Toujeo 60 units at bedtime (if sugars in am not <130, you can increase further, to 70).  - Humalog 10-15 min before a meal. - 30 units before a smaller meal - 40 units before a regular meal  - 50 units before a larger meal (try to use 50-60 units for lunch)  Please return in 3 months with your sugar log.   - Continue checking sugars at different times of the day - check 3 times a day, rotating checks - advised for yearly eye exams >> he is UTD - Return to clinic in 3 mo with sugar log   Philemon Kingdom, MD PhD [facility] Endocrinology

## 2017-02-27 NOTE — Patient Instructions (Addendum)
Please increase: - Lantus or Toujeo 60 units at bedtime (if sugars in am not <130, you can increase further, to 70).  - Humalog 10-15 min before a meal. - 30 units before a smaller meal - 40 units before a regular meal  - 50 units before a larger meal (try to use 50-60 units for lunch)  Please return in 3 months with your sugar log.

## 2017-02-28 LAB — POCT GLYCOSYLATED HEMOGLOBIN (HGB A1C): Hemoglobin A1C: 8.1

## 2017-02-28 NOTE — Addendum Note (Signed)
Addended by: Darene Lamer T on: 02/28/2017 08:33 AM   Modules accepted: Orders

## 2017-03-13 ENCOUNTER — Other Ambulatory Visit: Payer: Self-pay | Admitting: Internal Medicine

## 2017-03-28 ENCOUNTER — Other Ambulatory Visit: Payer: Self-pay | Admitting: Family Medicine

## 2017-03-28 DIAGNOSIS — K644 Residual hemorrhoidal skin tags: Secondary | ICD-10-CM

## 2017-06-12 DIAGNOSIS — E113293 Type 2 diabetes mellitus with mild nonproliferative diabetic retinopathy without macular edema, bilateral: Secondary | ICD-10-CM | POA: Diagnosis not present

## 2017-06-12 LAB — HM DIABETES EYE EXAM

## 2017-08-08 ENCOUNTER — Encounter: Payer: Self-pay | Admitting: Internal Medicine

## 2017-08-08 ENCOUNTER — Ambulatory Visit (INDEPENDENT_AMBULATORY_CARE_PROVIDER_SITE_OTHER): Payer: BLUE CROSS/BLUE SHIELD | Admitting: Internal Medicine

## 2017-08-08 VITALS — BP 142/80 | HR 80 | Wt 300.0 lb

## 2017-08-08 DIAGNOSIS — E11319 Type 2 diabetes mellitus with unspecified diabetic retinopathy without macular edema: Secondary | ICD-10-CM | POA: Diagnosis not present

## 2017-08-08 DIAGNOSIS — Z794 Long term (current) use of insulin: Secondary | ICD-10-CM

## 2017-08-08 LAB — POCT GLYCOSYLATED HEMOGLOBIN (HGB A1C): HEMOGLOBIN A1C: 8.2

## 2017-08-08 MED ORDER — INSULIN LISPRO 100 UNIT/ML (KWIKPEN)
PEN_INJECTOR | SUBCUTANEOUS | 3 refills | Status: DC
Start: 2017-08-08 — End: 2017-08-11

## 2017-08-08 MED ORDER — INSULIN PEN NEEDLE 32G X 4 MM MISC: 3 refills | Status: DC

## 2017-08-08 MED ORDER — FREESTYLE LIBRE READER DEVI: 1.0000 | Freq: Three times a day (TID) | 1 refills | Status: DC

## 2017-08-08 MED ORDER — INSULIN GLARGINE 300 UNIT/ML ~~LOC~~ SOPN: 65.0000 [IU] | PEN_INJECTOR | Freq: Every day | SUBCUTANEOUS | 5 refills | Status: DC

## 2017-08-08 MED ORDER — FREESTYLE LIBRE SENSOR SYSTEM MISC: 1.0000 | 11 refills | Status: DC

## 2017-08-08 NOTE — Patient Instructions (Addendum)
Please switch from Lantus to Toujeo and increase the dose to 65 units at bedtime (may need a higher dose in the near future).  Continue Humalog 10-15 min before a meal. - 30 units before a smaller meal - 40 units before a regular meal  - 50 units before a larger meal  Please return in 3 months with your sugar log.

## 2017-08-08 NOTE — Progress Notes (Signed)
Patient ID: Tyler Aguirre, male   DOB: [birth year], 51 y.o.   MRN: [id number]   HPI: Tyler Aguirre is a 51 y.o.-year-old male, returning for follow-up for DM2, dx in the 1990s, insulin-dependent, uncontrolled, with complications (DR). Last visit 5.5 mo ago.  He has a URI. He is fatigued, drousy. Will see PCP today for this.  Last hemoglobin A1c was: Lab Results  Component Value Date   HGBA1C 8.1 02/28/2017   HGBA1C 7.0 11/28/2016   HGBA1C 6.6 (H) 08/22/2016   He is on: - Lantus 50 units at bedtime (did not increase to 60-70 as advised) - Humalog 10-15 min before a meal. - 30 units before a smaller meal - 40 units before a regular meal  - 50 units before a larger meal He could not tolerate Trulicity 1.60 mg weekly  >> nausea. He had to stop Invokana in the past because of constipation. He stopped Metformin 2/2 GI intolerance.  Pt checks his sugars 0-1x a day: - am: 144-177, 247 >> 116, 129-197 >> 120-200s (later meals) >> 180-190s - 2h after b'fast: n/c - before lunch: n/c >> 242 >> 130-160 >> n/c as very busy - 2h after lunch: 154-289, 359 >> 350 >> n/c - before dinner: 97-203 >> 204-297 >> 200s (may have a snack - sweets, etc.) >> 180-200s - 2h after dinner: 181, 323 >> 327, 333 >> n/c - bedtime: n/c - nighttime: n/c >> 60 at 12 am  Lowest sugar was 39 (woke him up) x1 - very active, small dinner >> ... >> 60s-70s >> at night: 90; he has hypoglycemia awareness at 80.  Highest sugar was 360s >> 333 >> 260s >> 300s  Glucometer: Verio  Diet: - b'fast: bisquit - lunch: fast food - snack: protein bar - dinner: takeout He is on the road most of the day, going to times houses. He mentions that he cannot take food with him and he has to rely on fast food.  - no CKD, last BUN/creatinine:  Lab Results  Component Value Date   BUN 15 08/22/2016   BUN 13 03/01/2016   CREATININE 0.68 08/22/2016   CREATININE 0.8 03/01/2016  On Avapro.  He has a h/o high ACR >> resolved: Lab  Results  Component Value Date   MICRALBCREAT 1.9 08/22/2016   MICRALBCREAT 6.7 01/22/2010   MICRALBCREAT 4.1 09/02/2009   MICRALBCREAT 6.9 02/05/2009   MICRALBCREAT 13.9 11/30/2007   - last set of lipids: Lab Results  Component Value Date   CHOL 214 (H) 08/22/2016   HDL 46.00 08/22/2016   LDLCALC 150 (H) 08/22/2016   LDLDIRECT 147.8 01/22/2010   TRIG 92.0 08/22/2016   CHOLHDL 5 08/22/2016  On Zocor - last eye exam was in 05/2017 >> + DR.   - denies numbness and tingling in his feet.  He had an episode of presyncope when exercising in the park. A recent EKG, exercise stress test, 2D Echo (Dr. Sallyanne Kuster) >> normal >> cleared to start exercise again.   ROS: Constitutional: no weight gain/no weight loss, + fatigue, + fever/chills Eyes: no blurry vision, no xerophthalmia ENT: no sore throat, no nodules palpated in throat, no dysphagia, no odynophagia, no hoarseness Cardiovascular: no CP/no SOB/no palpitations/no leg swelling Respiratory: + cough/no SOB/+ wheezing Gastrointestinal: no N/no V/no D/no C (this has improved)/no acid reflux Musculoskeletal: no muscle aches/no joint aches Skin: no rashes, no hair loss Neurological: no tremors/no numbness/no tingling/no dizziness  I reviewed pt's medications, allergies, PMH, social hx, family hx,  and changes were documented in the history of present illness. Otherwise, unchanged from my initial visit note.  Past Medical History:  Diagnosis Date  . ALLERGIC RHINITIS 03/12/2009  . DIABETES MELLITUS, TYPE II 02/26/2007  . [place] DISEASE, LUMBAR 06/16/2009  . HYPERLIPIDEMIA 02/26/2007  . HYPERTENSION 02/26/2007  . NUMBNESS 04/17/2009  . Overweight(278.02) 02/26/2007  . PROSTATITIS, ACUTE 04/17/2009  . TRANSVERSE MYELITIS 05/01/2009   Past Surgical History:  Procedure Laterality Date  . COLONOSCOPY    . DENTAL SURGERY    . POLYPECTOMY     Social History   Social History  . Marital status: Single    Spouse name: N/A  . 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 Smoking status: Never Smoker  . Smokeless tobacco: Never Used  . Alcohol use Yes  . Drug use: No   Current Outpatient Prescriptions on File Prior to Visit  Medication Sig Dispense Refill  . Aurora Lancet Super Thin 30G MISC by Does not apply route. As direceted     . azelastine (ASTELIN) 0.1 % nasal spray Place 2 sprays into both nostrils 2 (two) times daily. Use in each nostril as directed 30 mL 6  . Chlorphen-PE-Acetaminophen (CORICIDIN D COLD/FLU/SINUS PO) Take 2 tablets by mouth daily.    . fluticasone (FLONASE) 50 MCG/ACT nasal spray Place 1 spray into both nostrils 2 (two) times daily. 16 g 3  . glucose blood test strip 1 each by Other route as needed. Use as instructed 100 each 3  . HUMALOG KWIKPEN 100 UNIT/ML KiwkPen INJECT 40-50 UNITS INTO THE SKIN 3 TIMES DAILY BEFORE MEALS 45 pen 0  . Insulin Glargine (TOUJEO SOLOSTAR) 300 UNIT/ML SOPN Inject 60 Units into the skin at bedtime. 9 pen 2  . irbesartan (AVAPRO) 150 MG tablet Take 1 tablet (150 mg total) by mouth daily. 30 tablet 11  . PROCTOSOL HC 2.5 % rectal cream APPLY RECTALLY TWICE DAILY 28.35 g 0  . Dulaglutide (TRULICITY) 4.97 WY/6.3ZC SOPN Inject under skin 0.75 mg weekly (Patient not taking: Reported on 08/08/2017) 4 pen 1  . furosemide (LASIX) 20 MG tablet Take 1 tablet (20 mg total) by mouth daily. 30 tablet 11   No current facility-administered medications on file prior to visit.    Allergies  Allergen Reactions  . Ibuprofen Other (See Comments)    Has to go to the bathroom all the time  . Metformin And Related Diarrhea    Unable to tolerate XL formulation.    Family History  Problem Relation Age of Onset  . Heart disease Mother        valve dz  . Arthritis Mother        RA  . Anemia Mother   . Hypertension Father   . Heart disease Father   . Heart disease Maternal Uncle        CAD  . Diabetes Paternal Grandmother   .  Obesity Paternal Grandmother   . Hypertension Paternal Grandmother   . Cancer Paternal Grandfather        lung  . Mental retardation Paternal Grandfather        suicided  . Hypertension Paternal Grandfather   . Diabetes Paternal Grandfather   . Obesity Maternal Grandfather   . Hypertension Maternal Grandfather   . Stroke Paternal Uncle   . Cancer Paternal Uncle    PE: BP (!) 142/80 (BP Location: Left  Arm, Patient Position: Sitting)   Pulse 80   Wt 300 lb (136.1 kg)   SpO2 97%   BMI 40.69 kg/m  Wt Readings from Last 3 Encounters:  08/08/17 300 lb (136.1 kg)  02/27/17 (!) 304 lb (137.9 kg)  12/01/16 (!) 300 lb 6.4 oz (136.3 kg)   Constitutional: overweight, in NAD Eyes: PERRLA, EOMI, no exophthalmos ENT: moist mucous membranes, no thyromegaly, no cervical lymphadenopathy Cardiovascular: RRR, No MRG Respiratory: CTA B Gastrointestinal: abdomen soft, NT, ND, BS+ Musculoskeletal: no deformities, strength intact in all 4 Skin: moist, warm, no rashes Neurological: no tremor with outstretched hands, DTR normal in all 4  ASSESSMENT: 1. DM2, insulin-dependent, uncontrolled, with complications - DR  2. Obesity class 3  PLAN:  1. Patient with long-standing, Uncontrolled diabetes, on basal-bolus insulin regimen, with intolerance to previously tried oral and injectable medicines. He has a poor diet, consisting mainly of fast food and snacks, and he was not able to improve this despite multiple discussions in the past. - Since last visit, he did not increase his long-acting insulin as advised as he forgot. Since he is taking a much lower amount of basal insulin compared to mealtime insulin, I again advised him to increase this. We will also try to switch to toujeo, since this is 3 times more concentrated and has a better profile compared to Lantus. If he cannot start toujeo (not covered by insurance), we will need to stay with Lantus.  - He had previous GI symptoms with other medicines,  but he is open to retry them in the future if really needed  - He will probably require U500 insulin soon  - I suggested to:  Patient Instructions  Please switch from Lantus to Toujeo and increase the dose to 65 units at bedtime (may need a higher dose in the near future).  Continue Humalog 10-15 min before a meal. - 30 units before a smaller meal - 40 units before a regular meal  - 50 units before a larger meal  Please return in 3 months with your sugar log.   - today, HbA1c is 8.2% (slightly higher) - continue checking sugars at different times of the day - check 3x a day, rotating checks - advised for yearly eye exams >> he is UTD - new dx of DR! >>Discussed the absolute need to improve his diabetes control to be able to reverse this - He will have annual physical exam with PCP At the end of this week. - return to clinic in 3 mo with sugar log   2. Obesity class 3 - stable weight, did not lose a signif. Amount of weight since last visit - He had significant constipation with SGLT2 inhibitors before, and nausea with GLP-1 receptor agonists. However, she tells me that his constipation is much better now, and he would be open to restart these if really needed in the future. This would be a good idea in his case to limit weight gain as well as improving his diabetes control  Philemon Kingdom, MD PhD [facility] Endocrinology

## 2017-08-10 ENCOUNTER — Encounter: Payer: Self-pay | Admitting: Family Medicine

## 2017-08-10 NOTE — Progress Notes (Signed)
HPI:   [name] is a 51 y.o. male, who is here today c/o "sinus issues" and to follow on some chronic medical problems.  Last seen on 12/01/16.   DM II, he follows with Dr [name]. He also follows with cardiologists, Dr Sallyanne Kuster   Hx of allergic rhinitis.  3 weeks of intermittent night time fever, subjective. Hacking cough that caused vomiting and mainly at night when he is in bed. Intermittent sore throat. Frontal pressure headache.  He states that about 6 weeks ago he was treated with abx for ear infection. Symptoms resolved.  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  He is not on OTC treatment for allergies. He takes Claritin and Flonase if needed, has not done so recently.  He took cough med yesterday.  + Wheezing , no dyspnea. He denies hx of asthma.   His BS's " a little high", diabetes treatment was adjusted recently. Lab Results  Component Value Date   HGBA1C 8.2 08/08/2017    He denies heartburn, abdominal pain,changes in bowel habits, or melena.   Insomnia:  He is also c/o having trouble falling asleep, chronic. He "cannot wake up" in the morning, still very sleepy when his alarm goes off but better ones he gets up and keeps going. Denies Hx of OSA but reports loud snoring.  + Stress, which seems to exacerbate problem. Has not tried OTC med.    HTN:  Currently he is on Avapro 150 mg daily. He does not check BP at home.  Denies visual changes, chest pain, dyspnea, palpitation, claudication, focal weakness, or edema.   He has not been consistent with a healthy diet and regular physical activity.  Review of Systems  Constitutional: Positive for fatigue. Negative for activity change, appetite change, chills and fever.  HENT: Positive for congestion, postnasal drip, rhinorrhea, sinus pressure and sore throat. Negative for ear pain, facial swelling, mouth sores, nosebleeds, sneezing, trouble swallowing and voice change.   Eyes: Negative for  discharge, redness and itching.  Respiratory: Positive for cough and wheezing. Negative for chest tightness and shortness of breath.   Cardiovascular: Negative for palpitations and leg swelling.  Gastrointestinal: Positive for nausea and vomiting (realated with coughing spells.). Negative for abdominal pain and diarrhea.       No changes in bowel habits.  Endocrine: Negative for cold intolerance and heat intolerance.  Genitourinary: Negative for decreased urine volume and hematuria.  Musculoskeletal: Negative for gait problem, myalgias and neck pain.  Skin: Negative for pallor and rash.  Allergic/Immunologic: Positive for environmental allergies.  Neurological: Positive for headaches. Negative for dizziness, syncope, facial asymmetry and weakness.  Hematological: Negative for adenopathy. Does not bruise/bleed easily.  Psychiatric/Behavioral: Positive for sleep disturbance. Negative for confusion. The patient is nervous/anxious.       Current Outpatient Prescriptions on File Prior to Visit  Medication Sig Dispense Refill  . Aurora Lancet Super Thin 30G MISC by Does not apply route. As direceted     . Chlorphen-PE-Acetaminophen (CORICIDIN D COLD/FLU/SINUS PO) Take 2 tablets by mouth daily.    . Continuous Blood Gluc Receiver (FREESTYLE LIBRE READER) DEVI 1 Device by Does not apply route 3 (three) times daily. 1 Device 1  . Continuous Blood Gluc Sensor (FREESTYLE LIBRE SENSOR SYSTEM) MISC 1 Device by Does not apply route every 30 (thirty) days. 3 each 11  . glucose blood test strip 1 each by Other route as needed. Use as instructed 100 each 3  . Insulin Glargine (TOUJEO  SOLOSTAR) 300 UNIT/ML SOPN Inject 65 Units into the skin at bedtime. 9 pen 5  . Insulin Pen Needle (CAREFINE PEN NEEDLES) 32G X 4 MM MISC Use 4x a day 300 each 3  . irbesartan (AVAPRO) 150 MG tablet Take 1 tablet (150 mg total) by mouth daily. 30 tablet 11  . PROCTOSOL HC 2.5 % rectal cream APPLY RECTALLY TWICE DAILY 28.35 g 0   . furosemide (LASIX) 20 MG tablet Take 1 tablet (20 mg total) by mouth daily. 30 tablet 11   No current facility-administered medications on file prior to visit.      Past Medical History:  Diagnosis Date  . ALLERGIC RHINITIS 03/12/2009  . DIABETES MELLITUS, TYPE II 02/26/2007  . [place] DISEASE, LUMBAR 06/16/2009  . HYPERLIPIDEMIA 02/26/2007  . HYPERTENSION 02/26/2007  . NUMBNESS 04/17/2009  . Overweight(278.02) 02/26/2007  . PROSTATITIS, ACUTE 04/17/2009  . TRANSVERSE MYELITIS 05/01/2009   Allergies  Allergen Reactions  . Ibuprofen Other (See Comments)    Has to go to the bathroom all the time  . Metformin And Related Diarrhea    Unable to tolerate XL formulation.     Social History   Social History  . Marital status: Single    Spouse name: N/A  . Number of children: N/A  . Years of education: N/A   Social History Main Topics  . Smoking status: Never Smoker  . Smokeless tobacco: Never Used  . Alcohol use Yes  . Drug use: No  . Sexual activity: Yes    Birth control/ protection: Condom   Other Topics Concern  . None   Social History Narrative  . None    Vitals:   08/11/17 1014  BP: 130/90  Pulse: 80  Temp: 98.4 F (36.9 C)  SpO2: 96%   Body mass index is 40.98 kg/m.   Physical Exam  Nursing note and vitals reviewed. Constitutional: He is oriented to person, place, and time. He appears well-developed. He does not appear ill. No distress.  HENT:  Head: Normocephalic and atraumatic.  Right Ear: External ear and ear canal normal. A middle ear effusion is present.  Left Ear: External ear and ear canal normal. A middle ear effusion is present.  Nose: Rhinorrhea and septal deviation present. Right sinus exhibits maxillary sinus tenderness (mild). Right sinus exhibits no frontal sinus tenderness. Left sinus exhibits maxillary sinus tenderness (mild). Left sinus exhibits no frontal sinus tenderness.  Mouth/Throat: Oropharynx is clear and moist and mucous membranes are  normal.  Hypertrophic turbinates  Eyes: Pupils are equal, round, and reactive to light. Conjunctivae are normal.  Cardiovascular: Normal rate and regular rhythm.   No murmur heard. Respiratory: Effort normal and breath sounds normal. No respiratory distress.  GI: Soft. He exhibits no mass. There is no hepatomegaly. There is no tenderness.  Musculoskeletal: He exhibits edema (Trace pitting LE edema, bilateral). He exhibits no tenderness.  Lymphadenopathy:    He has no cervical adenopathy.  Neurological: He is alert and oriented to person, place, and time. He has normal strength. Gait normal.  Skin: Skin is warm. No rash noted. No erythema.  Psychiatric: He has a normal mood and affect. His speech is normal.  Fairly groomed, good eye contact.    ASSESSMENT AND PLAN:   Tyler Aguirre was seen today for sinus problem and follow-up.  Diagnoses and all orders for this visit:  Insomnia, unspecified type  We discussed a few pharmacologic treatment options. Recommended trying OTC Melatonin ER 3-5 mg at bedtime. Good sleep  hygiene. May consider sleep study in the future.  Allergic rhinitis, unspecified seasonality, unspecified trigger  He recently completed oral antibiotics for ear infection, today we agree on holding on antibiotic treatment for ? Bacterial sinusitis. Instructed to check temp. He will try Prednisone, side effects discussed. He will resume OTC Claritin as well as Flonase nasal spray and Astelin nasal spray. Nasal irrigations with saline as needed. He was instructed to let me know in about a week if he has not noted any improvement, before if symptoms get worse.  -     predniSONE (DELTASONE) 20 MG tablet; Take 2 tablets (40 mg total) by mouth daily with breakfast. -     azelastine (ASTELIN) 0.1 % nasal spray; Place 2 sprays into both nostrils 2 (two) times daily. Use in each nostril as directed -     fluticasone (FLONASE) 50 MCG/ACT nasal spray; Place 1 spray into both  nostrils 2 (two) times daily.  Cough  Possible etiologies discussed: Allergies, GI, s/p URI among some. Today lung auscultation is negative, no wheezing today. ? RAD: Prednisone may help. Instructed about warning signs. Follow-up as needed.  -     DG Chest 2 View; Future  Essential hypertension  DBP Slightly elevated. No changes in current management for now. Low salt diet recommended. According to pt, he will see cardiologists around 10/2017.  Type 2 diabetes mellitus with retinopathy without macular edema, with long-term current use of insulin, unspecified laterality, unspecified retinopathy severity ([place])  Poorly controlled. We discussed some side effects of Prednisone, instructed to monitor BS's closely. No changes in current management. He will continue following with Dr [name].   -Tyler Aguirre was advised to return sooner than planned today if new concerns arise.       Keirra Zeimet G. [place], MD  [facility]. [place] office.

## 2017-08-11 ENCOUNTER — Encounter: Payer: Self-pay | Admitting: Family Medicine

## 2017-08-11 ENCOUNTER — Ambulatory Visit (INDEPENDENT_AMBULATORY_CARE_PROVIDER_SITE_OTHER)
Admission: RE | Admit: 2017-08-11 | Discharge: 2017-08-11 | Disposition: A | Discharge status: Home / Self Care | Payer: BLUE CROSS/BLUE SHIELD | Source: Ambulatory Visit | Attending: Family Medicine | Admitting: Family Medicine

## 2017-08-11 ENCOUNTER — Ambulatory Visit (INDEPENDENT_AMBULATORY_CARE_PROVIDER_SITE_OTHER): Payer: BLUE CROSS/BLUE SHIELD | Admitting: Family Medicine

## 2017-08-11 ENCOUNTER — Telehealth: Payer: Self-pay | Admitting: Internal Medicine

## 2017-08-11 ENCOUNTER — Other Ambulatory Visit: Payer: Self-pay

## 2017-08-11 ENCOUNTER — Telehealth: Payer: Self-pay

## 2017-08-11 VITALS — BP 130/90 | HR 80 | Temp 98.4°F | Ht 72.0 in | Wt 302.1 lb

## 2017-08-11 DIAGNOSIS — R05 Cough: Secondary | ICD-10-CM

## 2017-08-11 DIAGNOSIS — Z794 Long term (current) use of insulin: Secondary | ICD-10-CM | POA: Diagnosis not present

## 2017-08-11 DIAGNOSIS — G4700 Insomnia, unspecified: Secondary | ICD-10-CM

## 2017-08-11 DIAGNOSIS — I10 Essential (primary) hypertension: Secondary | ICD-10-CM | POA: Diagnosis not present

## 2017-08-11 DIAGNOSIS — J309 Allergic rhinitis, unspecified: Secondary | ICD-10-CM

## 2017-08-11 DIAGNOSIS — E11319 Type 2 diabetes mellitus with unspecified diabetic retinopathy without macular edema: Secondary | ICD-10-CM

## 2017-08-11 DIAGNOSIS — R059 Cough, unspecified: Secondary | ICD-10-CM

## 2017-08-11 MED ORDER — FLUTICASONE PROPIONATE 50 MCG/ACT NA SUSP: 1.0000 | Freq: Two times a day (BID) | NASAL | 3 refills | Status: DC

## 2017-08-11 MED ORDER — AZELASTINE HCL 0.1 % NA SOLN: 2.0000 | Freq: Two times a day (BID) | NASAL | 6 refills | Status: DC

## 2017-08-11 MED ORDER — PREDNISONE 20 MG PO TABS
40.0000 mg | ORAL_TABLET | Freq: Every day | ORAL | 0 refills | Status: AC
Start: 2017-08-11 — End: 2017-08-16

## 2017-08-11 MED ORDER — INSULIN ASPART 100 UNIT/ML FLEXPEN: PEN_INJECTOR | SUBCUTANEOUS | 1 refills | Status: DC

## 2017-08-11 NOTE — Patient Instructions (Signed)
A few things to remember from today's visit:   Allergic rhinitis, unspecified seasonality, unspecified trigger - Plan: predniSONE (DELTASONE) 20 MG tablet  Cough - Plan: DG Chest 2 View  Insomnia, unspecified type  Melatonin EC 3-5 mg at bed time.  If sinus congestion is not better in 6-7 day please let me know.   There are 2 forms of allergic rhinitis: . Seasonal (hay fever): Caused by an allergy to pollen and/or mold spores in the air. Pollen is the fine powder that comes from the stamen of flowering plants. It can be carried through the air and is easily inhaled. Symptoms are seasonal and usually occur in spring, late summer, and fall. Marland Kitchen Perennial: Caused by other allergens such as dust mites, pet hair or dander, or mold. Symptoms occur year-round.  Symptoms: Your symptoms can vary, depending on the severity of your allergies. Symptoms can include: Sneezing, coughing.itching (mostly eyes, nose, mouth, throat and skin),runny nose,stuffy nose.headache,pressure in the nose and cheeks,ear fullness and popping, sore throat.watery, red, or swollen eyes,dark circles under your eyes,trouble smelling, and sometimes hives.  Allergic rhinitis cannot be prevented. You can help your symptoms by avoiding the things that you are allergic, including: . Keeping windows closed. This is especially important during high-pollen seasons. . Washing your hands after petting animals. . Using dust- and mite-proof bedding and mattress covers. . Wearing glasses outside to protect your eyes. . Showering before bed to wash off allergens from hair and skin. You can also avoid things that can make your symptoms worse, such as: . aerosol sprays . air pollution . cold temperatures . humidity . irritating fumes . tobacco smoke . wind . wood smoke.   Antihistamines help reduce the sneezing, runny nose, and itchiness of allergies. These come in pill form and as nasal sprays. Allegra,Zyrtec,or Claritin are some  examples. Decongestants, such as pseudoephedrine and phenylephrine, help temporarily relieve the stuffy nose of allergies. Decongestants are found in many medicines and come as pills, nose sprays, and nose drops. They could increase heart rate and cause tachycardia and tremor. Nasal Afrin should not be used for more than 3 days because you can become dependent on them. This causes you to feel even more stopped-up when you try to quit using them.  Nasal sprays: steroids or antihistaminics. Over the counter intranasal sterids: Nasocort,Rhinocort,or Flonase.You won't notice their benefits for up to 2 weeks after starting them. Allergy shots or sublingual tablets when other treatment do not help.This is done by immunologists.    Please be sure medication list is accurate. If a new problem present, please set up appointment sooner than planned today.

## 2017-08-11 NOTE — Telephone Encounter (Signed)
Called patient and LVM advising that we are getting conflicting information from pharmacy regarding which is covered. I advised patient to call back with which one was covered and we would be glad to send it in, left call back number.

## 2017-08-11 NOTE — Telephone Encounter (Signed)
Called patient and gave lab results. Patient had no questions or concerns.  

## 2017-08-11 NOTE — Telephone Encounter (Signed)
Novolog Flexpen not covered by insurance. Patient believes Humalog is covered by insurance.  Please call back to discuss.  Ty,  -LL

## 2017-08-22 ENCOUNTER — Encounter: Payer: Self-pay | Admitting: Family Medicine

## 2017-08-27 ENCOUNTER — Other Ambulatory Visit: Payer: Self-pay | Admitting: Family Medicine

## 2017-08-27 MED ORDER — AMOXICILLIN-POT CLAVULANATE 875-125 MG PO TABS: 1.0000 | ORAL_TABLET | Freq: Two times a day (BID) | ORAL | 0 refills | Status: AC

## 2017-08-27 MED ORDER — MOMETASONE FUROATE 50 MCG/ACT NA SUSP: 2.0000 | Freq: Every day | NASAL | 3 refills | Status: DC

## 2017-09-15 ENCOUNTER — Telehealth: Payer: Self-pay | Admitting: Family Medicine

## 2017-09-15 ENCOUNTER — Encounter: Payer: Self-pay | Admitting: Adult Health

## 2017-09-15 ENCOUNTER — Ambulatory Visit (INDEPENDENT_AMBULATORY_CARE_PROVIDER_SITE_OTHER): Payer: BLUE CROSS/BLUE SHIELD | Admitting: Adult Health

## 2017-09-15 VITALS — BP 180/80 | Temp 97.9°F | Wt 307.0 lb

## 2017-09-15 DIAGNOSIS — S83411A Sprain of medial collateral ligament of right knee, initial encounter: Secondary | ICD-10-CM

## 2017-09-15 NOTE — Telephone Encounter (Signed)
Pt is here for his appointment and state that [facility]'s are to [facility] [address]

## 2017-09-15 NOTE — Telephone Encounter (Signed)
Patient does not need refills at this time 

## 2017-09-15 NOTE — Progress Notes (Signed)
Subjective:    Patient ID: Tyler Aguirre, male    DOB: 1965/12/22, 51 y.o.   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   He presents to the office today for the acute complaint of right knee pain. He reports that he was laying in bed one week ago and kicked the covers off,at which time he felt a pain on the inside of his right knee. As the week went on his pain has increased. He has no difficulty standing or walking. Pain is more apparent with sitting. Denies any bruising or trauma    Review of Systems See HPI   Past Medical History:  Diagnosis Date  . ALLERGIC RHINITIS 03/12/2009  . DIABETES MELLITUS, TYPE II 02/26/2007  . [place] DISEASE, LUMBAR 06/16/2009  . HYPERLIPIDEMIA 02/26/2007  . HYPERTENSION 02/26/2007  . NUMBNESS 04/17/2009  . Overweight(278.02) 02/26/2007  . PROSTATITIS, ACUTE 04/17/2009  . TRANSVERSE MYELITIS 05/01/2009    Social History   Social History  . Marital status: Single    Spouse name: N/A  . Number of children: N/A  . Years of education: N/A   Occupational History  . Not on file.   Social History Main Topics  . Smoking status: Never Smoker  . Smokeless tobacco: Never Used  . Alcohol use Yes  . Drug use: No  . Sexual activity: Yes    Birth control/ protection: Condom   Other Topics Concern  . Not on file   Social History Narrative  . No narrative on file    Past Surgical History:  Procedure Laterality Date  . COLONOSCOPY    . DENTAL SURGERY    . POLYPECTOMY      Family History  Problem Relation Age of Onset  . Heart disease Mother        valve dz  . Arthritis Mother        RA  . Anemia Mother   . Hypertension Father   . Heart disease  Father   . Heart disease Maternal Uncle        CAD  . Diabetes Paternal Grandmother   . Obesity Paternal Grandmother   . Hypertension Paternal Grandmother   . Cancer Paternal Grandfather        lung  . Mental retardation Paternal Grandfather        suicided  . Hypertension Paternal Grandfather   . Diabetes Paternal Grandfather   . Obesity Maternal Grandfather   . Hypertension Maternal Grandfather   . Stroke Paternal Uncle   . Cancer Paternal Uncle     Allergies  Allergen Reactions  . Ibuprofen Other (See Comments)    Has to go to the bathroom all the time  . Metformin And Related Diarrhea    Unable to tolerate XL formulation.     Current Outpatient Prescriptions on File Prior to Visit  Medication Sig Dispense Refill  . [place] Lancet Super Thin 30G MISC by Does not apply route. As direceted     . azelastine (ASTELIN) 0.1 % nasal spray Place 2 sprays into both nostrils 2 (two) times daily. Use in each nostril as directed 30 mL 6  . Continuous Blood Gluc Receiver (FREESTYLE LIBRE READER) DEVI 1 Device by Does not apply route 3 (three) times daily. 1 Device 1  .  Continuous Blood Gluc Sensor (FREESTYLE LIBRE SENSOR SYSTEM) MISC 1 Device by Does not apply route every 30 (thirty) days. 3 each 11  . glucose blood test strip 1 each by Other route as needed. Use as instructed 100 each 3  . insulin aspart (NOVOLOG FLEXPEN) 100 UNIT/ML FlexPen Inject 40-50 units 3 times daily 45 pen 1  . Insulin Glargine (TOUJEO SOLOSTAR) 300 UNIT/ML SOPN Inject 65 Units into the skin at bedtime. 9 pen 5  . Insulin Pen Needle (CAREFINE PEN NEEDLES) 32G X 4 MM MISC Use 4x a day 300 each 3  . irbesartan (AVAPRO) 150 MG tablet Take 1 tablet (150 mg total) by mouth daily. 30 tablet 11  . mometasone (NASONEX) 50 MCG/ACT nasal spray Place 2 sprays into the nose daily. 17 g 3  . PROCTOSOL HC 2.5 % rectal cream APPLY RECTALLY TWICE DAILY 28.35 g 0  . furosemide (LASIX) 20 MG tablet Take 1 tablet (20 mg total) by  mouth daily. 30 tablet 11   No current facility-administered medications on file prior to visit.     BP (!) 180/80 (BP Location: Right Arm)   Temp 97.9 F (36.6 C) (Oral)   Wt (!) 307 lb (139.3 kg)   BMI 41.64 kg/m       Objective:   Physical Exam  Constitutional: He is oriented to person, place, and time. He appears well-developed and well-nourished. No distress.  Cardiovascular: Normal rate, regular rhythm, normal heart sounds and intact distal pulses.  Exam reveals no gallop and no friction rub.   No murmur heard. Pulmonary/Chest: Effort normal and breath sounds normal. No respiratory distress. He has no wheezes. He has no rales. He exhibits no tenderness.  Musculoskeletal:       Right knee: He exhibits normal range of motion, no swelling, no effusion, no erythema, normal patellar mobility, no bony tenderness, normal meniscus and no MCL laxity. Tenderness found. MCL tenderness noted.  No pain with straight leg raise, knee to chest, or external rotation. He did have slight pain wit internal rotation. Pain was located along the [facility]   Neurological: He is alert and oriented to person, place, and time.  Skin: Skin is warm and dry. No rash noted. He is not diaphoretic. No erythema. No pallor.  Psychiatric: He has a normal mood and affect. His behavior is normal. Judgment and thought content normal.  Nursing note and vitals reviewed.     Assessment & Plan:  1. Sprain of medial collateral ligament of right knee, initial encounter -No concern to MCL tear  - Advised rest, Ice, elevation, and tylenol. He is unable to take Motrin due to GI issues.  - Follow up if no improvement   Dorothyann Peng, NP

## 2017-09-21 ENCOUNTER — Telehealth: Payer: Self-pay | Admitting: Family Medicine

## 2017-09-21 DIAGNOSIS — M25561 Pain in right knee: Secondary | ICD-10-CM | POA: Diagnosis not present

## 2017-09-21 NOTE — Telephone Encounter (Signed)
Pt walked in to office as [employer] was calling to advise of their call (which pt hung up on). Pt states that his knee pain has increased and he heard a "pop" today. We do not have any OV openings left for today. Spoke with Kandee Keenory for advice. He recommends pt go to UC or be seen by Dr. [place] tomorrow. Attempted to speak with pt and advise, he would not let me finish explaining his options and interrupted me saying "well guess I just have to go to the emergency room"... I attempted to advise the pt that an UC would be a more appropriate option but he walked out the door as I was trying to explain this to him.   Nothing further needed at this time.

## 2017-09-21 NOTE — Telephone Encounter (Signed)
[facility] - Client TELEPHONE ADVICE RECORD [facility] Patient Name: Tyler CobbleJOHN Nifong DOB: 01/13/1966 Initial Comment Caller states was seen last week for a hurt knee while working today knee popped and not able to put weight on it. Nurse Assessment Nurse: Ladona RidgelGaddy, RN, Felicia Date/Time (Eastern Time): 09/21/2017 3:24:24 PM Confirm and document reason for call. If symptomatic, describe symptoms. ---PT injured R knee and saw. He saw a male NP here last week for it - he thought it was a strain and he said stay off of it but it kept hurting. It popped today when he was walking up stairs. He is having trouble putting wt on it. HE can but it hurts Does the patient have any new or worsening symptoms? ---Yes Will a triage be completed? ---Yes Related visit to physician within the last 2 weeks? ---Yes Does the PT have any chronic conditions? (i.e. diabetes, asthma, etc.) ---YesList chronic conditions. ---DM Is this a behavioral health or substance abuse call? ---No Guidelines Guideline Title Affirmed Question Affirmed Notes Knee Injury A "snap" or "pop" was heard at the time of injury Final Disposition User See Physician within 24 Hours YoungGaddy, RN, Sunny SchleinFelicia Comments The first time it started hurting he had just kicked a blanket while in bed did not have opportunity to see if he could understand the advice. Suggested he hold while I call the desk to see if they can see him today. He ended call and said he was coming in the building. He can walk but it hurts and he has not had pain medication yet - he is wearing a brace so could not answer questions about knee looking dislocated Sheena was reached in the office and she said he goes to DrJordon who is not hear and they are booked today - she said she will advise him to go to UC. He can walk but heard a pop today going up stairs Referrals GO TO FACILITY UNDECIDED Caller Disagree/Comply Comply Caller  Understands No PreDisposition Did not know what to do Call Id: [phone]

## 2017-09-27 DIAGNOSIS — M25561 Pain in right knee: Secondary | ICD-10-CM | POA: Diagnosis not present

## 2017-10-08 ENCOUNTER — Other Ambulatory Visit: Payer: Self-pay | Admitting: Internal Medicine

## 2017-10-14 DIAGNOSIS — J111 Influenza due to unidentified influenza virus with other respiratory manifestations: Secondary | ICD-10-CM | POA: Diagnosis not present

## 2017-10-14 DIAGNOSIS — R0981 Nasal congestion: Secondary | ICD-10-CM | POA: Diagnosis not present

## 2017-10-14 DIAGNOSIS — R509 Fever, unspecified: Secondary | ICD-10-CM | POA: Diagnosis not present

## 2017-10-14 DIAGNOSIS — E119 Type 2 diabetes mellitus without complications: Secondary | ICD-10-CM | POA: Diagnosis not present

## 2017-10-17 DIAGNOSIS — R509 Fever, unspecified: Secondary | ICD-10-CM | POA: Diagnosis not present

## 2017-10-17 DIAGNOSIS — J0140 Acute pansinusitis, unspecified: Secondary | ICD-10-CM | POA: Diagnosis not present

## 2017-10-17 DIAGNOSIS — J302 Other seasonal allergic rhinitis: Secondary | ICD-10-CM | POA: Diagnosis not present

## 2017-10-17 DIAGNOSIS — R0981 Nasal congestion: Secondary | ICD-10-CM | POA: Diagnosis not present

## 2017-10-21 DIAGNOSIS — H4710 Unspecified papilledema: Secondary | ICD-10-CM

## 2017-10-21 DIAGNOSIS — H539 Unspecified visual disturbance: Secondary | ICD-10-CM

## 2017-10-21 DIAGNOSIS — J111 Influenza due to unidentified influenza virus with other respiratory manifestations: Secondary | ICD-10-CM

## 2017-10-21 HISTORY — DX: Unspecified papilledema: H47.10

## 2017-10-21 HISTORY — DX: Unspecified visual disturbance: H53.9

## 2017-10-21 HISTORY — DX: Influenza due to unidentified influenza virus with other respiratory manifestations: J11.1

## 2017-10-23 ENCOUNTER — Other Ambulatory Visit: Payer: Self-pay | Admitting: Ophthalmology

## 2017-10-23 ENCOUNTER — Encounter (HOSPITAL_COMMUNITY): Payer: Self-pay | Admitting: Emergency Medicine

## 2017-10-23 ENCOUNTER — Other Ambulatory Visit: Payer: Self-pay

## 2017-10-23 ENCOUNTER — Ambulatory Visit (HOSPITAL_COMMUNITY)
Admission: EM | Admit: 2017-10-23 | Discharge: 2017-10-23 | Disposition: A | Discharge status: Home / Self Care | Payer: BLUE CROSS/BLUE SHIELD | Attending: Family Medicine | Admitting: Family Medicine

## 2017-10-23 DIAGNOSIS — E113292 Type 2 diabetes mellitus with mild nonproliferative diabetic retinopathy without macular edema, left eye: Secondary | ICD-10-CM | POA: Diagnosis not present

## 2017-10-23 DIAGNOSIS — H5711 Ocular pain, right eye: Secondary | ICD-10-CM | POA: Diagnosis not present

## 2017-10-23 DIAGNOSIS — H4710 Unspecified papilledema: Secondary | ICD-10-CM | POA: Diagnosis not present

## 2017-10-23 DIAGNOSIS — E113211 Type 2 diabetes mellitus with mild nonproliferative diabetic retinopathy with macular edema, right eye: Secondary | ICD-10-CM | POA: Diagnosis not present

## 2017-10-23 NOTE — ED Provider Notes (Signed)
[facility]    CSN: [id number] Arrival date & time: 10/23/17  1004     History   Chief Complaint Chief Complaint  Patient presents with  . Headache    HPI Tyler Aguirre is a 51 y.o. male.   51 year old male complaining of right eye pain. He states he also had what was believed to be a flulike illness approximately 2 weeks ago. The symptoms have since abated. At that time he developed right eye pain which has been progressing over the past 2 weeks. He went back to the urgent care who initially diagnosed in and was placed on antibiotics and steroids. He has taken those in the did not get any better. He is complaining of pain to the right eye getting worse, it is constant, he cannot sleep with the pain he has developed associated blurring of vision and then more recently photophobia. It is not getting any better and is more constant than intermittent.      Past Medical History:  Diagnosis Date  . ALLERGIC RHINITIS 03/12/2009  . DIABETES MELLITUS, TYPE II 02/26/2007  . [place] DISEASE, LUMBAR 06/16/2009  . HYPERLIPIDEMIA 02/26/2007  . HYPERTENSION 02/26/2007  . NUMBNESS 04/17/2009  . Overweight(278.02) 02/26/2007  . PROSTATITIS, ACUTE 04/17/2009  . TRANSVERSE MYELITIS 05/01/2009    Patient Active Problem List   Diagnosis Date Noted  . Insomnia 08/11/2017  . Chronic diastolic heart failure ([place]) 10/31/2016  . Severe obesity (BMI 35.0-39.9) with comorbidity ([place]) 08/12/2016  . [place] DISEASE, LUMBAR 06/16/2009  . TRANSVERSE MYELITIS 05/01/2009  . PROSTATITIS, ACUTE 04/17/2009  . NUMBNESS 04/17/2009  . Allergic rhinitis 03/12/2009  . 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 Hyperlipidemia, unspecified 02/26/2007  . OVERWEIGHT 02/26/2007  . Essential hypertension 02/26/2007    Past Surgical History:  Procedure Laterality Date  . COLONOSCOPY    . DENTAL SURGERY    . POLYPECTOMY         Home Medications      Prior to Admission medications   Medication Sig Start Date End Date Taking? Authorizing Provider  Dulaglutide (TRULICITY [place]) Inject into the skin.   Yes [provider]  Julienne Kass Super Thin 30G MISC by Does not apply route. As direceted     [provider]  azelastine (ASTELIN) 0.1 % nasal spray Place 2 sprays into both nostrils 2 (two) times daily. Use in each nostril as directed 08/11/17   [place], Betty G, MD  Continuous Blood Gluc Receiver (FREESTYLE LIBRE READER) DEVI 1 Device by Does not apply route 3 (three) times daily. 08/08/17   Philemon Kingdom, MD  Continuous Blood Gluc Sensor (FREESTYLE LIBRE SENSOR SYSTEM) MISC 1 Device by Does not apply route every 30 (thirty) days. 08/08/17   Philemon Kingdom, MD  glucose blood test strip 1 each by Other route as needed. Use as instructed 12/02/16   Philemon Kingdom, MD  Insulin Glargine (TOUJEO SOLOSTAR) 300 UNIT/ML SOPN Inject 65 Units into the skin at bedtime. 08/08/17   Philemon Kingdom, MD  Insulin Pen Needle (CAREFINE PEN NEEDLES) 32G X 4 MM MISC Use 4x a day 08/08/17   Philemon Kingdom, MD  irbesartan (AVAPRO) 150 MG tablet Take 1 tablet (150 mg total) by mouth daily. 09/01/16   Croitoru, Mihai, MD  NOVOLOG FLEXPEN 100 UNIT/ML FlexPen INJECT 40-50 UNITS 3 TIMES DAILY 10/10/17   Philemon Kingdom, MD  PROCTOSOL HC 2.5 % rectal cream APPLY RECTALLY TWICE DAILY 03/29/17   [place],  Malka So, MD    Family History Family History  Problem Relation Age of Onset  . Heart disease Mother        valve dz  . Arthritis Mother        RA  . Anemia Mother   . Hypertension Father   . Heart disease Father   . Heart disease Maternal Uncle        CAD  . Diabetes Paternal Grandmother   . Obesity Paternal Grandmother   . Hypertension Paternal Grandmother   . Cancer Paternal Grandfather        lung  . Mental retardation Paternal Grandfather        suicided  . Hypertension Paternal Grandfather   . Diabetes Paternal Grandfather    . Obesity Maternal Grandfather   . Hypertension Maternal Grandfather   . Stroke Paternal Uncle   . Cancer Paternal Uncle     Social History Social History   Tobacco Use  . Smoking status: Never Smoker  . Smokeless tobacco: Never Used  Substance Use Topics  . Alcohol use: Yes  . Drug use: No     Allergies   Ibuprofen and Metformin and related   Review of Systems Review of Systems  Constitutional: Positive for activity change. Negative for fatigue and fever.  HENT: Negative.   Eyes: Positive for photophobia, pain and visual disturbance. Negative for discharge and redness.  Respiratory: Negative.   Neurological: Positive for headaches.  All other systems reviewed and are negative.    Physical Exam Triage Vital Signs ED Triage Vitals  Enc Vitals Group     BP 10/23/17 1037 (!) 154/92     Pulse Rate 10/23/17 1037 87     Resp 10/23/17 1037 20     Temp 10/23/17 1037 98.7 F (37.1 C)     Temp Source 10/23/17 1037 Oral     SpO2 10/23/17 1037 97 %     Weight --      Height --      Head Circumference --      Peak Flow --      Pain Score 10/23/17 1032 6     Pain Loc --      Pain Edu? --      Excl. in [place]? --    No data found.  Updated Vital Signs BP (!) 154/92 (BP Location: Left Arm) Comment (BP Location): large cuff  Pulse 87   Temp 98.7 F (37.1 C) (Oral)   Resp 20   SpO2 97%   Visual Acuity Right Eye Distance:   Left Eye Distance:   Bilateral Distance:    Right Eye Near:   Left Eye Near:    Bilateral Near:     Physical Exam  Constitutional: He is oriented to person, place, and time. He appears well-developed and well-nourished. He does not appear ill. No distress.  Eyes: EOM are normal. Pupils are equal, round, and reactive to light. No scleral icterus. Right eye exhibits no nystagmus. Left eye exhibits no nystagmus. Pupils are equal.  Anterior chambers clear. Sclerae are without erythema.  Neck: Normal range of motion. Neck supple.  Cardiovascular:  Normal rate.  Pulmonary/Chest: Effort normal. No respiratory distress.  Musculoskeletal: He exhibits no edema.  Neurological: He is alert and oriented to person, place, and time. He has normal strength. He exhibits normal muscle tone.  Skin: Skin is warm and dry.  Psychiatric: He has a normal mood and affect.  Nursing note and vitals reviewed.  UC Treatments / Results  Labs (all labs ordered are listed, but only abnormal results are displayed) Labs Reviewed - No data to display  EKG  EKG Interpretation None       Radiology No results found.  Procedures Procedures (including critical care time)  Medications Ordered in UC Medications - No data to display   Initial Impression / Assessment and Plan / UC Course  I have reviewed the triage vital signs and the nursing notes.  Pertinent labs & imaging results that were available during my care of the patient were reviewed by me and considered in my medical decision making (see chart for details).    Go to see Dr. Posey Pronto at his office this afternoon at 1:15.   Consulted with Dr. Posey Pronto palpation and office.  Final Clinical Impressions(s) / UC Diagnoses   Final diagnoses:  Acute right eye pain    ED Discharge Orders    None       Controlled Substance Prescriptions [place] Controlled Substance Registry consulted? Not Applicable   Janne Napoleon, NP 10/23/17 2125

## 2017-10-23 NOTE — ED Triage Notes (Addendum)
Headache for 2 weeks, every day per patient.  Seen at a an urgent care near his home.  Told he has the flu.  Went back to that ucc with continued complaints of pain at right eye.  Steroid shot, antibiotic was started.  Now no better, unable to sleep.  Never had pain like this.  Pain is very specific to above right eye at eye brow.  Pain is worse with lying down.  Vision in right eye is blurry.    Reports two weeks ago he fell-"blacked out" and fell into tub.  patient does not think he hit head at that time  Spoke to [place], pa in regards to this patient

## 2017-10-23 NOTE — Discharge Instructions (Signed)
Go to see Dr. Allena KatzPatel at his office this afternoon at 1:15.

## 2017-10-26 ENCOUNTER — Other Ambulatory Visit: Payer: Self-pay

## 2017-10-26 ENCOUNTER — Emergency Department (HOSPITAL_COMMUNITY): Payer: BLUE CROSS/BLUE SHIELD

## 2017-10-26 ENCOUNTER — Encounter (HOSPITAL_COMMUNITY): Payer: Self-pay

## 2017-10-26 ENCOUNTER — Observation Stay (HOSPITAL_COMMUNITY)
Admission: EM | Admit: 2017-10-26 | Discharge: 2017-10-28 | Disposition: A | Discharge status: Home / Self Care | Payer: BLUE CROSS/BLUE SHIELD | Attending: Family Medicine | Admitting: Family Medicine

## 2017-10-26 DIAGNOSIS — E11319 Type 2 diabetes mellitus with unspecified diabetic retinopathy without macular edema: Secondary | ICD-10-CM | POA: Diagnosis not present

## 2017-10-26 DIAGNOSIS — E782 Mixed hyperlipidemia: Secondary | ICD-10-CM

## 2017-10-26 DIAGNOSIS — R5383 Other fatigue: Secondary | ICD-10-CM | POA: Insufficient documentation

## 2017-10-26 DIAGNOSIS — I110 Hypertensive heart disease with heart failure: Secondary | ICD-10-CM | POA: Diagnosis not present

## 2017-10-26 DIAGNOSIS — E785 Hyperlipidemia, unspecified: Secondary | ICD-10-CM | POA: Diagnosis present

## 2017-10-26 DIAGNOSIS — H57811 Brow ptosis, right: Secondary | ICD-10-CM | POA: Diagnosis not present

## 2017-10-26 DIAGNOSIS — Z79899 Other long term (current) drug therapy: Secondary | ICD-10-CM | POA: Diagnosis not present

## 2017-10-26 DIAGNOSIS — R519 Headache, unspecified: Secondary | ICD-10-CM

## 2017-10-26 DIAGNOSIS — Z794 Long term (current) use of insulin: Secondary | ICD-10-CM | POA: Insufficient documentation

## 2017-10-26 DIAGNOSIS — H538 Other visual disturbances: Secondary | ICD-10-CM | POA: Diagnosis not present

## 2017-10-26 DIAGNOSIS — I10 Essential (primary) hypertension: Secondary | ICD-10-CM | POA: Diagnosis not present

## 2017-10-26 DIAGNOSIS — H539 Unspecified visual disturbance: Secondary | ICD-10-CM

## 2017-10-26 DIAGNOSIS — I5032 Chronic diastolic (congestive) heart failure: Secondary | ICD-10-CM | POA: Diagnosis not present

## 2017-10-26 DIAGNOSIS — R51 Headache: Secondary | ICD-10-CM | POA: Diagnosis not present

## 2017-10-26 HISTORY — DX: Influenza due to unidentified influenza virus with other respiratory manifestations: J11.1

## 2017-10-26 HISTORY — DX: Unspecified papilledema: H47.10

## 2017-10-26 HISTORY — DX: Unspecified visual disturbance: H53.9

## 2017-10-26 LAB — CBC WITH DIFFERENTIAL/PLATELET
BASOS PCT: 0 %
Basophils Absolute: 0 10*3/uL (ref 0.0–0.1)
EOS ABS: 0.1 10*3/uL (ref 0.0–0.7)
EOS PCT: 2 %
HCT: 41.9 % (ref 39.0–52.0)
Hemoglobin: 14.4 g/dL (ref 13.0–17.0)
Lymphocytes Relative: 21 %
Lymphs Abs: 1.6 10*3/uL (ref 0.7–4.0)
MCH: 32.4 pg (ref 26.0–34.0)
MCHC: 34.4 g/dL (ref 30.0–36.0)
MCV: 94.4 fL (ref 78.0–100.0)
MONO ABS: 0.7 10*3/uL (ref 0.1–1.0)
MONOS PCT: 9 %
Neutro Abs: 5.2 10*3/uL (ref 1.7–7.7)
Neutrophils Relative %: 68 %
Platelets: 107 10*3/uL — ABNORMAL LOW (ref 150–400)
RBC: 4.44 MIL/uL (ref 4.22–5.81)
RDW: 13.2 % (ref 11.5–15.5)
WBC: 7.6 10*3/uL (ref 4.0–10.5)

## 2017-10-26 LAB — COMPREHENSIVE METABOLIC PANEL
ALBUMIN: 3 g/dL — AB (ref 3.5–5.0)
ALK PHOS: 107 U/L (ref 38–126)
ALT: 65 U/L — AB (ref 17–63)
AST: 68 U/L — ABNORMAL HIGH (ref 15–41)
Anion gap: 10 (ref 5–15)
BILIRUBIN TOTAL: 1.7 mg/dL — AB (ref 0.3–1.2)
BUN: 12 mg/dL (ref 6–20)
CALCIUM: 8.5 mg/dL — AB (ref 8.9–10.3)
CO2: 20 mmol/L — AB (ref 22–32)
CREATININE: 0.75 mg/dL (ref 0.61–1.24)
Chloride: 103 mmol/L (ref 101–111)
GFR calc Af Amer: >60 mL/min (ref >60)
GFR calc non Af Amer: >60 mL/min (ref >60)
GLUCOSE: 393 mg/dL — AB (ref 65–99)
Potassium: 4.3 mmol/L (ref 3.5–5.1)
SODIUM: 133 mmol/L — AB (ref 135–145)
TOTAL PROTEIN: 7.2 g/dL (ref 6.5–8.1)

## 2017-10-26 LAB — PROTIME-INR
INR: 1.15
PROTHROMBIN TIME: 14.6 s (ref 11.4–15.2)

## 2017-10-26 LAB — CBG MONITORING, ED: Glucose-Capillary: 232 mg/dL — ABNORMAL HIGH (ref 65–99)

## 2017-10-26 LAB — SEDIMENTATION RATE: SED RATE: 34 mm/h — AB (ref 0–16)

## 2017-10-26 LAB — C-REACTIVE PROTEIN: CRP: 0.9 mg/dL (ref <1.0)

## 2017-10-26 MED ORDER — SODIUM CHLORIDE 0.9 % IV BOLUS (SEPSIS)
1000.0000 mL | Freq: Once | INTRAVENOUS | Status: AC
  Administered 2017-10-26: 1000 mL via INTRAVENOUS

## 2017-10-26 MED ORDER — ACETAMINOPHEN 325 MG PO TABS
650.0000 mg | ORAL_TABLET | Freq: Four times a day (QID) | ORAL | Status: DC | PRN
  Administered 2017-10-27: 650 mg via ORAL
  Filled 2017-10-26: qty 2

## 2017-10-26 MED ORDER — INSULIN ASPART 100 UNIT/ML ~~LOC~~ SOLN
0.0000 [IU] | Freq: Three times a day (TID) | SUBCUTANEOUS | Status: DC
  Administered 2017-10-27: 9 [IU] via SUBCUTANEOUS
  Administered 2017-10-27: 3 [IU] via SUBCUTANEOUS
  Administered 2017-10-27: 7 [IU] via SUBCUTANEOUS
  Administered 2017-10-28: 5 [IU] via SUBCUTANEOUS
  Administered 2017-10-28: 10 [IU] via SUBCUTANEOUS
  Administered 2017-10-28: 15 [IU] via SUBCUTANEOUS

## 2017-10-26 MED ORDER — PROCHLORPERAZINE EDISYLATE 5 MG/ML IJ SOLN
10.0000 mg | Freq: Once | INTRAMUSCULAR | Status: AC
  Administered 2017-10-26: 10 mg via INTRAVENOUS
  Filled 2017-10-26: qty 2

## 2017-10-26 MED ORDER — GADOBENATE DIMEGLUMINE 529 MG/ML IV SOLN
20.0000 mL | Freq: Once | INTRAVENOUS | Status: AC
  Administered 2017-10-26: 20 mL via INTRAVENOUS

## 2017-10-26 MED ORDER — ACETAMINOPHEN 650 MG RE SUPP: 650.0000 mg | Freq: Four times a day (QID) | RECTAL | Status: DC | PRN

## 2017-10-26 MED ORDER — INSULIN GLARGINE 100 UNIT/ML ~~LOC~~ SOLN
65.0000 [IU] | Freq: Every day | SUBCUTANEOUS | Status: DC
  Administered 2017-10-27 (×2): 65 [IU] via SUBCUTANEOUS
  Filled 2017-10-26 (×3): qty 0.65

## 2017-10-26 MED ORDER — DIPHENHYDRAMINE HCL 50 MG/ML IJ SOLN
25.0000 mg | Freq: Once | INTRAMUSCULAR | Status: AC
  Administered 2017-10-26: 25 mg via INTRAVENOUS
  Filled 2017-10-26: qty 1

## 2017-10-26 MED ORDER — SODIUM CHLORIDE 0.9% FLUSH
3.0000 mL | Freq: Two times a day (BID) | INTRAVENOUS | Status: DC
  Administered 2017-10-27 (×2): 3 mL via INTRAVENOUS

## 2017-10-26 MED ORDER — IRBESARTAN 150 MG PO TABS
150.0000 mg | ORAL_TABLET | Freq: Every day | ORAL | Status: DC
  Administered 2017-10-27 – 2017-10-28 (×2): 150 mg via ORAL
  Filled 2017-10-26 (×2): qty 1

## 2017-10-26 MED ORDER — SODIUM CHLORIDE 0.9% FLUSH: 3.0000 mL | INTRAVENOUS | Status: DC | PRN

## 2017-10-26 MED ORDER — SODIUM CHLORIDE 0.9 % IV SOLN: 250.0000 mL | INTRAVENOUS | Status: DC | PRN

## 2017-10-26 NOTE — H&P (Signed)
History and Physical    Stafford Riviera BVQ:[id number] DOB: 04-29-1966 DOA: 10/26/2017  PCP: Martinique, Betty G, MD  Patient coming from:  home  Chief Complaint:  Double vision and right eye pain  HPI: Tyler Aguirre is a 51 y.o. male with medical history significant of DM, HTN with recent flu 3 weeks ago comes in with almost 3 weeks of right eye pain and vision changes.  Pt has seen ophthalmogy as outpt who noted papilledema in right eye and ordered an MRI.  His symptoms worsened today so he came to the ED with more eye pain.  He denies any fevers.  His flu has resolved.  No other focal neurolgical issues.  Having a lot of headaches.   His vision is double and improves when he closes his right eye.  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  Mri neg for infarct.  Review of Systems: As per HPI otherwise 10 point review of systems negative.   Past Medical History:  Diagnosis Date  . ALLERGIC RHINITIS 03/12/2009  . DIABETES MELLITUS, TYPE II 02/26/2007  . [place] DISEASE, LUMBAR 06/16/2009  . HYPERLIPIDEMIA 02/26/2007  . HYPERTENSION 02/26/2007  . NUMBNESS 04/17/2009  . Overweight(278.02) 02/26/2007  . PROSTATITIS, ACUTE 04/17/2009  . 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 COLONOSCOPY    . DENTAL SURGERY    . POLYPECTOMY       reports that  has never smoked. he has never used smokeless tobacco. He reports that he drinks alcohol. He reports that he does not use drugs.  Allergies  Allergen Reactions  . Ibuprofen Other (See Comments)    Has to go to the bathroom all the time  . Metformin And Related Diarrhea    Unable to tolerate XL formulation.     Family History  Problem Relation Age of Onset  . Heart disease Mother        valve dz  . Arthritis Mother        RA  . Anemia Mother   . Hypertension Father   . Heart disease Father   . Heart disease Maternal Uncle         CAD  . Diabetes Paternal Grandmother   . Obesity Paternal Grandmother   . Hypertension Paternal Grandmother   . Cancer Paternal Grandfather        lung  . Mental retardation Paternal Grandfather        suicided  . Hypertension Paternal Grandfather   . Diabetes Paternal Grandfather   . Obesity Maternal Grandfather   . Hypertension Maternal Grandfather   . Stroke Paternal Uncle   . Cancer Paternal Uncle     Prior to Admission medications   Medication Sig Start Date End Date Taking? Authorizing Provider  Aspirin-Acetaminophen-Caffeine (GOODY HEADACHE PO) Take 1 Package by mouth every 2 (two) hours as needed (pain).    Yes [provider]  azelastine (ASTELIN) 0.1 % nasal spray Place 2 sprays into both nostrils 2 (two) times daily. Use in each nostril as directed 08/11/17  Yes Martinique, Betty G, MD  Insulin Glargine (TOUJEO SOLOSTAR) 300 UNIT/ML SOPN Inject 65 Units into the skin at bedtime. 08/08/17  Yes Philemon Kingdom, MD  irbesartan (AVAPRO) 150 MG tablet Take 1 tablet (150 mg total) by mouth daily. 09/01/16  Yes Croitoru, Mihai, MD  NOVOLOG FLEXPEN 100 UNIT/ML FlexPen INJECT 40-50 UNITS 3 TIMES DAILY  10/10/17  Yes Philemon Kingdom, MD  PROCTOSOL HC 2.5 % rectal cream APPLY RECTALLY TWICE DAILY 03/29/17  Yes Martinique, Betty G, MD  [facility] Super Thin 30G MISC by Does not apply route. As direceted     [provider]  Continuous Blood Gluc Receiver (FREESTYLE LIBRE READER) DEVI 1 Device by Does not apply route 3 (three) times daily. 08/08/17   Philemon Kingdom, MD  Continuous Blood Gluc Sensor (FREESTYLE LIBRE SENSOR SYSTEM) MISC 1 Device by Does not apply route every 30 (thirty) days. 08/08/17   Philemon Kingdom, MD  glucose blood test strip 1 each by Other route as needed. Use as instructed 12/02/16   Philemon Kingdom, MD  Insulin Pen Needle (CAREFINE PEN NEEDLES) 32G X 4 MM MISC Use 4x a day 08/08/17   Philemon Kingdom, MD    Physical Exam: Vitals:    10/26/17 1845 10/26/17 1900 10/26/17 1915 10/26/17 1930  BP: (!) 143/63 (!) 144/66 (!) 161/68 (!) 171/85  Pulse: 82 88 80 74  Resp: (!) 24 (!) _0 Temp:      TempSrc:      SpO2: 100% 99% 98% 93%  Weight:      Height:          Constitutional: NAD, calm, comfortable Vitals:   10/26/17 1845 10/26/17 1900 10/26/17 1915 10/26/17 1930  BP: (!) 143/63 (!) 144/66 (!) 161/68 (!) 171/85  Pulse: 82 88 80 74  Resp: (!) 24 (!) _1 Temp:      TempSrc:      SpO2: 100% 99% 98% 93%  Weight:      Height:       Eyes: PERRL, lids and conjunctivae normal, decongugate gaze noted ENMT: Mucous membranes are moist. Posterior pharynx clear of any exudate or lesions.Normal dentition.  Neck: normal, supple, no masses, no thyromegaly Respiratory: clear to auscultation bilaterally, no wheezing, no crackles. Normal respiratory effort. No accessory muscle use.  Cardiovascular: Regular rate and rhythm, no murmurs / rubs / gallops. No extremity edema. 2+ pedal pulses. No carotid bruits.  Abdomen: no tenderness, no masses palpated. No hepatosplenomegaly. Bowel sounds positive.  Musculoskeletal: no clubbing / cyanosis. No joint deformity upper and lower extremities. Good ROM, no contractures. Normal muscle tone.  Skin: no rashes, lesions, ulcers. No induration Neurologic: CN 2-12 grossly intact. Sensation intact, DTR normal. Strength 5/5 in all 4.  Psychiatric: Normal judgment and insight. Alert and oriented x 3. Normal mood.    Labs on Admission: I have personally reviewed following labs and imaging studies  CBC: Recent Labs  Lab 10/26/17 1114  WBC 7.6  NEUTROABS 5.2  HGB 14.4  HCT 41.9  MCV 94.4  PLT 076*   Basic Metabolic Panel: Recent Labs  Lab 10/26/17 1114  NA 133*  K 4.3  CL 103  CO2 20*  GLUCOSE 393*  BUN 12  CREATININE 0.75  CALCIUM 8.5*   GFR: Estimated Creatinine Clearance: 157.8 mL/min (by C-G formula based on SCr of 0.75 mg/dL). Liver Function Tests: Recent Labs   Lab 10/26/17 1114  AST 68*  ALT 65*  ALKPHOS 107  BILITOT 1.7*  PROT 7.2  ALBUMIN 3.0*   No results for input(s): LIPASE, AMYLASE in the last 168 hours. No results for input(s): AMMONIA in the last 168 hours. Coagulation Profile: Recent Labs  Lab 10/26/17 1812  INR 1.15   Cardiac Enzymes: No results for input(s): CKTOTAL, CKMB, CKMBINDEX, TROPONINI in the last 168 hours. BNP (last 3 results) No  results for input(s): PROBNP in the last 8760 hours. HbA1C: No results for input(s): HGBA1C in the last 72 hours. CBG: No results for input(s): GLUCAP in the last 168 hours. Lipid Profile: No results for input(s): CHOL, HDL, LDLCALC, TRIG, CHOLHDL, LDLDIRECT in the last 72 hours. Thyroid Function Tests: No results for input(s): TSH, T4TOTAL, FREET4, T3FREE, THYROIDAB in the last 72 hours. Anemia Panel: No results for input(s): VITAMINB12, FOLATE, FERRITIN, TIBC, IRON, RETICCTPCT in the last 72 hours. Urine analysis:    Component Value Date/Time   COLORURINE yellow 01/22/2010 0847   APPEARANCEUR Clear 01/22/2010 0847   LABSPEC 1.020 01/22/2010 0847   PHURINE 5.0 01/22/2010 0847   GLUCOSEU 250 (A) 04/20/2009 1639   HGBUR negative 01/22/2010 0847   BILIRUBINUR n 08/22/2016 0933   KETONESUR NEGATIVE 04/20/2009 1639   PROTEINUR 1+ 08/22/2016 0933   PROTEINUR NEGATIVE 04/20/2009 1639   UROBILINOGEN 2.0 08/22/2016 0933   UROBILINOGEN 0.2 01/22/2010 0847   NITRITE n 08/22/2016 0933   NITRITE negative 01/22/2010 0847   LEUKOCYTESUR Negative 08/22/2016 0933   Sepsis Labs: !!!!!!!!!!!!!!!!!!!!!!!!!!!!!!!!!!!!!!!!!!!! _0 (procalcitonin:4,lacticidven:4) )No results found for this or any previous visit (from the past 240 hour(s)).   Radiological Exams on Admission: Mr Jeri Cos And Wo Contrast  Result Date: 10/26/2017 CLINICAL DATA:  Initial evaluation for acute headaches, right eye pain and swelling. EXAM: MRI HEAD AND ORBITS WITHOUT AND WITH CONTRAST TECHNIQUE: Multiplanar,  multiecho pulse sequences of the brain and surrounding structures were obtained without and with intravenous contrast. Multiplanar, multiecho pulse sequences of the orbits and surrounding structures were obtained including fat saturation techniques, before and after intravenous contrast administration. CONTRAST:  42m MULTIHANCE GADOBENATE DIMEGLUMINE 529 MG/ML IV SOLN COMPARISON:  Prior MRI from 04/21/2009. FINDINGS: MRI HEAD FINDINGS Brain: Cerebral volume within normal limits. Asymmetric confluent T2/FLAIR hyperintensity within the left periatrial white matter, similar to previous. Additional mild T2/FLAIR hyperintensity within the periventricular white matter both cerebral hemispheres, nonspecific, but mildly progressed relative to previous exam. One of these foci position within the right periatrial white matter is oriented perpendicular to the lateral ventricle (series 5, image 14). No abnormal foci of restricted diffusion to suggest acute or subacute ischemia. Gray-white matter differentiation maintained. No other evidence for chronic infarction. No foci of susceptibility artifact to suggest acute or chronic intracranial hemorrhage. No mass lesion, midline shift or mass effect. No hydrocephalus. No extra-axial fluid collection. Major dural sinuses are grossly patent. No findings to suggest cavernous sinus thrombosis. Pituitary gland suprasellar region within normal limits. Midline structures intact and normal. Vascular: Major intracranial vascular flow voids are maintained. Skull and upper cervical spine: Craniocervical junction normal. Upper cervical spine within normal limits. Bone marrow signal intensity normal. MRI ORBITS FINDINGS Orbits:  Study degraded by motion artifact. Globes are symmetric in size with normal morphology. Optic nerves symmetric in size and normal in appearance. No appreciable optic nerve edema or abnormal enhancement. No abnormality seen at the orbital apices are about the cavernous  sinus. Optic chiasm normally position within the suprasellar cistern. Extra-ocular muscles are normal in appearance and symmetric bilaterally. Lacrimal glands are normal. Superior orbital veins symmetric and within normal limits bilaterally. Intraconal and extraconal fat well-maintained. Visualized sinuses: Mild scattered mucoperiosteal thickening within the ethmoidal air cells. Visualized paranasal sinuses are otherwise clear. No air-fluid level to suggest acute sinusitis. Soft tissues: No appreciable periorbital soft tissue abnormality. IMPRESSION: 1. No acute intracranial abnormality. 2. Asymmetric confluent T2/FLAIR hyperintensity within the left periatrial white matter, similar to previous. Additional mild cerebral white  matter changes are mildly progressed relative 2010. Findings are nonspecific, and may due to chronic small vessel ischemic changes. However, one of these foci positioned at the right periatrial white matter is oriented perpendicular to the lateral ventricle, which can be seen in the setting of underlying demyelinating disease. Clinical correlation recommended. No findings to suggest active demyelination on this exam. 3. Normal MRI of the orbits. No abnormality about the cavernous sinus. 4. Mild ethmoidal sinus disease, likely allergic/inflammatory nature. Electronically Signed   By: Jeannine Boga M.D.   On: 10/26/2017 22:21   Mr Rosealee Albee QJ Contrast  Result Date: 10/26/2017 CLINICAL DATA:  Initial evaluation for acute headaches, right eye pain and swelling. EXAM: MRI HEAD AND ORBITS WITHOUT AND WITH CONTRAST TECHNIQUE: Multiplanar, multiecho pulse sequences of the brain and surrounding structures were obtained without and with intravenous contrast. Multiplanar, multiecho pulse sequences of the orbits and surrounding structures were obtained including fat saturation techniques, before and after intravenous contrast administration. CONTRAST:  69m MULTIHANCE GADOBENATE DIMEGLUMINE 529  MG/ML IV SOLN COMPARISON:  Prior MRI from 04/21/2009. FINDINGS: MRI HEAD FINDINGS Brain: Cerebral volume within normal limits. Asymmetric confluent T2/FLAIR hyperintensity within the left periatrial white matter, similar to previous. Additional mild T2/FLAIR hyperintensity within the periventricular white matter both cerebral hemispheres, nonspecific, but mildly progressed relative to previous exam. One of these foci position within the right periatrial white matter is oriented perpendicular to the lateral ventricle (series 5, image 14). No abnormal foci of restricted diffusion to suggest acute or subacute ischemia. Gray-white matter differentiation maintained. No other evidence for chronic infarction. No foci of susceptibility artifact to suggest acute or chronic intracranial hemorrhage. No mass lesion, midline shift or mass effect. No hydrocephalus. No extra-axial fluid collection. Major dural sinuses are grossly patent. No findings to suggest cavernous sinus thrombosis. Pituitary gland suprasellar region within normal limits. Midline structures intact and normal. Vascular: Major intracranial vascular flow voids are maintained. Skull and upper cervical spine: Craniocervical junction normal. Upper cervical spine within normal limits. Bone marrow signal intensity normal. MRI ORBITS FINDINGS Orbits:  Study degraded by motion artifact. Globes are symmetric in size with normal morphology. Optic nerves symmetric in size and normal in appearance. No appreciable optic nerve edema or abnormal enhancement. No abnormality seen at the orbital apices are about the cavernous sinus. Optic chiasm normally position within the suprasellar cistern. Extra-ocular muscles are normal in appearance and symmetric bilaterally. Lacrimal glands are normal. Superior orbital veins symmetric and within normal limits bilaterally. Intraconal and extraconal fat well-maintained. Visualized sinuses: Mild scattered mucoperiosteal thickening within  the ethmoidal air cells. Visualized paranasal sinuses are otherwise clear. No air-fluid level to suggest acute sinusitis. Soft tissues: No appreciable periorbital soft tissue abnormality. IMPRESSION: 1. No acute intracranial abnormality. 2. Asymmetric confluent T2/FLAIR hyperintensity within the left periatrial white matter, similar to previous. Additional mild cerebral white matter changes are mildly progressed relative 2010. Findings are nonspecific, and may due to chronic small vessel ischemic changes. However, one of these foci positioned at the right periatrial white matter is oriented perpendicular to the lateral ventricle, which can be seen in the setting of underlying demyelinating disease. Clinical correlation recommended. No findings to suggest active demyelination on this exam. 3. Normal MRI of the orbits. No abnormality about the cavernous sinus. 4. Mild ethmoidal sinus disease, likely allergic/inflammatory nature. Electronically Signed   By: BJeannine BogaM.D.   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  Management per neurology team.  Will need iv steroids likely for unknown period of days.  Active Problems:   Hyperlipidemia, unspecified- stable, cont statin   Essential hypertension- cont home meds   Chronic diastolic heart failure ([place])- stable   DM - cont home lantus and ssi, diabetic diet     DVT prophylaxis:  scds Code Status:  full Family Communication:  none Disposition Plan:  Per day team Consults called:  neurology Admission status:  observation   Tharon Bomar A MD [facility]  If 7PM-7AM, please contact night-coverage [email or web address] Password [email or web address]  10/26/2017, 11:38 PM

## 2017-10-26 NOTE — ED Notes (Signed)
Pt called and asked whether or not he could have some food.

## 2017-10-26 NOTE — ED Notes (Signed)
Pt describes headache as "a migraine" and is requesting medication to alleviate it.

## 2017-10-26 NOTE — ED Triage Notes (Signed)
Pt endorses headaches x 2 weeks with right eye pain/swelling. Pt was told that he had flu type C and then was placed on antibiotics due to possible eye infection. Then pt went to eye dr and was supposed to have MRI today but had blood in stool this morning x 1. Hypertensive in triage. Axox4.

## 2017-10-26 NOTE — Consult Note (Signed)
NEURO HOSPITALIST CONSULT NOTE   Requestig physician: Dr. Shanon Brow  Reason for Consult: Right orbital pain x 3 weeks with associated double vision  History obtained from:  Patient and Chart     HPI:                                                                                                                                          Tyler Aguirre is an 51 y.o. male presents with right orbital pain of 3 weeks' duration. Began on a Friday and by the next Monday he had onset of binocular double vision. Also developed a headache in association with the right orbital pain. Thought to be a sinus infection initially, he was treated with antibiotics x 7 days without resolution of symptoms. He did get a steroid shot and eye symptoms were better for about 2 days afterwards, but then worsened again. He saw an ophthalmologist who told patient that there was nothing structurally wrong with the eye; an MRI was then recommended. The patient denies decreased visual acuity or loss of color saturation in either eye. The double vision resolves when either eye is closed. No confusion, facial droop, dysphasia, dysarthria, difficulty swallowing, limb weakness, facial numbness or limb numbness. He has no history of autoimmune disease.   MRI brain and orbits:  1. No acute intracranial abnormality. 2. Asymmetric confluent T2/FLAIR hyperintensity within the left periatrial white matter, similar to previous. Additional mild cerebral white matter changes are mildly progressed relative 2010. Findings are nonspecific, and may due to chronic small vessel ischemic changes. However, one of these foci positioned at the right periatrial white matter is oriented perpendicular to the lateral ventricle, which can be seen in the setting of underlying demyelinating disease. Clinical correlation recommended. No findings to suggest active demyelination on this exam. 3. Normal MRI of the orbits. No abnormality  about the cavernous sinus. 4. Mild ethmoidal sinus disease, likely allergic/inflammatory nature.  Past Medical History:  Diagnosis Date  . ALLERGIC RHINITIS 03/12/2009  . DIABETES MELLITUS, TYPE II 02/26/2007  . [place] DISEASE, LUMBAR 06/16/2009  . HYPERLIPIDEMIA 02/26/2007  . HYPERTENSION 02/26/2007  . NUMBNESS 04/17/2009  . Overweight(278.02) 02/26/2007  . PROSTATITIS, ACUTE 04/17/2009  . 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 COLONOSCOPY    . DENTAL SURGERY    . POLYPECTOMY      Family History  Problem Relation Age of Onset  . Heart disease Mother        valve dz  . Arthritis Mother        RA  . Anemia Mother   . Hypertension Father   . Heart disease Father   . Heart disease Maternal Uncle        CAD  .  Diabetes Paternal Grandmother   . Obesity Paternal Grandmother   . Hypertension Paternal Grandmother   . Cancer Paternal Grandfather        lung  . Mental retardation Paternal Grandfather        suicided  . Hypertension Paternal Grandfather   . Diabetes Paternal Grandfather   . Obesity Maternal Grandfather   . Hypertension Maternal Grandfather   . Stroke Paternal Uncle   . Cancer Paternal Uncle     Social History:  reports that  has never smoked. he has never used smokeless tobacco. He reports that he drinks alcohol. He reports that he does not use drugs.  Allergies  Allergen Reactions  . Ibuprofen Other (See Comments)    Has to go to the bathroom all the time  . Metformin And Related Diarrhea    Unable to tolerate XL formulation.     MEDICATIONS:                                                                                                                     Prior to Admission:  Medications Prior to Admission  Medication Sig Dispense Refill Last Dose  . Aspirin-Acetaminophen-Caffeine (GOODY HEADACHE PO) Take 1 Package by mouth every 2 (two) hours as needed (pain).    10/25/2017 at Unknown time  . azelastine  (ASTELIN) 0.1 % nasal spray Place 2 sprays into both nostrils 2 (two) times daily. Use in each nostril as directed 30 mL 6 10/25/2017 at Unknown time  . Insulin Glargine (TOUJEO SOLOSTAR) 300 UNIT/ML SOPN Inject 65 Units into the skin at bedtime. 9 pen 5 10/25/2017 at Unknown time  . irbesartan (AVAPRO) 150 MG tablet Take 1 tablet (150 mg total) by mouth daily. 30 tablet 11 10/25/2017 at Unknown time  . NOVOLOG FLEXPEN 100 UNIT/ML FlexPen INJECT 40-50 UNITS 3 TIMES DAILY 45 pen 1 10/25/2017 at Unknown time  . PROCTOSOL HC 2.5 % rectal cream APPLY RECTALLY TWICE DAILY 28.35 g 0 Taking  . [place] Lancet Super Thin 30G MISC by Does not apply route. As direceted    Taking  . Continuous Blood Gluc Receiver (FREESTYLE LIBRE READER) DEVI 1 Device by Does not apply route 3 (three) times daily. 1 Device 1 Taking  . Continuous Blood Gluc Sensor (FREESTYLE LIBRE SENSOR SYSTEM) MISC 1 Device by Does not apply route every 30 (thirty) days. 3 each 11 Taking  . glucose blood test strip 1 each by Other route as needed. Use as instructed 100 each 3 Taking  . Insulin Pen Needle (CAREFINE PEN NEEDLES) 32G X 4 MM MISC Use 4x a day 300 each 3 Taking    ROS:  As per HPI.   Blood pressure (!) 171/85, pulse 74, temperature 98.9 F (37.2 C), temperature source Oral, resp. rate 20, height 6' (1.829 m), weight 136.1 kg (300 lb), SpO2 93 %.  General Examination:                                                                                                      HEENT-  [place]/AT. No proptosis or scleral injection noted bilaterally. Irises and pupils appear normal. No periorbital edema or ecchymosis.  Lungs- Respirations unlabored Extremities- No edema  Neurological Examination Mental Status: Alert, fully oriented, thought content appropriate.  Speech fluent without evidence of aphasia.  Able to  follow all commands without difficulty. Cranial Nerves: II: Visual fields intact all 4 quadrants of each eye. No extinction to DSS. PERRL. Visual acuity normal bilaterally.  III,IV, VI: Mild right ptosis noted. Right globe with impaired upgaze and subtle weakness when gazing medially. Double vision occurs during evaluation. No nystagmus.  V,VII: No facial droop. Facial temp sensation normal in V1-3 bilaterally.  VIII: Hearing intact to voice.  IX,X: Palate rises symmetrically XI: Symmetric shoulder shrug XII: midline tongue extension Motor: Right : Upper extremity   5/5    Left:     Upper extremity   5/5  Lower extremity   5/5     Lower extremity   5/5 Normal tone throughout; no atrophy noted No pronator drift Sensory: Temp and light touch intact x 4. No extinction.  Deep Tendon Reflexes: 1-2+ and symmetric throughout Plantars: Right: downgoing  Left: downgoing Cerebellar: No ataxia with FNF bilaterally.  Gait: Deferred  Lab Results: Basic Metabolic Panel: Recent Labs  Lab 10/26/17 1114  NA 133*  K 4.3  CL 103  CO2 20*  GLUCOSE 393*  BUN 12  CREATININE 0.75  CALCIUM 8.5*    Liver Function Tests: Recent Labs  Lab 10/26/17 1114  AST 68*  ALT 65*  ALKPHOS 107  BILITOT 1.7*  PROT 7.2  ALBUMIN 3.0*   No results for input(s): LIPASE, AMYLASE in the last 168 hours. No results for input(s): AMMONIA in the last 168 hours.  CBC: Recent Labs  Lab 10/26/17 1114  WBC 7.6  NEUTROABS 5.2  HGB 14.4  HCT 41.9  MCV 94.4  PLT 107*    Cardiac Enzymes: No results for input(s): CKTOTAL, CKMB, CKMBINDEX, TROPONINI in the last 168 hours.  Lipid Panel: No results for input(s): CHOL, TRIG, HDL, CHOLHDL, VLDL, LDLCALC in the last 168 hours.  CBG: No results for input(s): GLUCAP in the last 168 hours.  Microbiology: Results for orders placed or performed during the hospital encounter of 04/20/09  Urine culture     Status: None   Collection Time: 04/22/09  5:48 AM  Result  Value Ref Range Status   Specimen Description URINE, CATHETERIZED  Final   Special Requests NONE  Final   Colony Count NO GROWTH  Final   Culture NO GROWTH  Final   Report Status 04/23/2009 FINAL  Final  HSV PCR     Status: None   Collection Time: 04/24/09  10:45 PM  Result Value Ref Range Status   HSV, PCR Not Detected Not Detect Final   HSV 2 , PCR  Not Detect Final    Not Detected (NOTE) This assay detects the presence of herpes simplex virus (HSV) DNA by real-time (PCR) amplification of the HSV glycoprotein B gene.  If HSV is detected, it is further categorized as HSV-type 1 or HSV-type 2 using probes unique to each subtype.   These results should be interpreted in conjunction with the patient's history and other relevant diagnostic information. Please note: The minimum volume of specimen required for this test, including CSF, is 200 uL (0.2 mL). The sensitivity of this test  is based on using this volume of sample.  Use of lower amounts may lead to false negative results.  Therefore, please be aware that submission of samples with less than 200 uL (0.2 mL) will be rejected for testing. This test was developed and its  performance characteristics have been determined by [employer], [place], [place]. Performance characteristics refer to the analytical performance of the test.  CSF cell count with differential     Status: Abnormal   Collection Time: 04/27/09  8:30 AM  Result Value Ref Range Status   Tube # 3  Final   Color, CSF COLORLESS COLORLESS Final   Appearance, CSF CLEAR CLEAR Final   Supernatant NOT INDICATED  Final   RBC Count, CSF 4 (H) 0 /cu mm Final   WBC, CSF 33 (H) 0 - 5 /cu mm Final   Segmented Neutrophils-CSF 0 0 - 6 % Final   Lymphs, CSF 98 (H) 40 - 80 % Final   Monocyte-Macrophage-Spinal Fluid 2 (L) 15 - 45 % Final   Eosinophils, CSF 0 0 - 1 % Final  HSV PCR     Status: None   Collection Time: 04/27/09  8:30 AM  Result Value Ref Range Status    HSV, PCR Not Detected Not Detect Final   HSV 2 , PCR  Not Detect Final    Not Detected (NOTE) This assay detects the presence of herpes simplex virus (HSV) DNA by real-time (PCR) amplification of the HSV glycoprotein B gene.  If HSV is detected, it is further categorized as HSV-type 1 or HSV-type 2 using probes unique to each subtype.   These results should be interpreted in conjunction with the patient's history and other relevant diagnostic information. Please note: The minimum volume of specimen required for this test, including CSF, is 200 uL (0.2 mL). The sensitivity of this test  is based on using this volume of sample.  Use of lower amounts may lead to false negative results.  Therefore, please be aware that submission of samples with less than 200 uL (0.2 mL) will be rejected for testing. This test was developed and its  performance characteristics have been determined by [employer], [place], [place]. Performance characteristics refer to the analytical performance of the test.   Specimen Source-HSVPCR CSF  Final  Oligoclonal bands, CSF + serm     Status: Abnormal   Collection Time: 04/27/09  8:30 AM  Result Value Ref Range Status   IgG, Serum 1200 768 - 1632 mg/dL Final   IgG, CSF 4.3 0.0 - 6.0 mg/dL Final   Albumin, Serum(Neph) 3260 (L) 3500 - 5200 mg/dL Final   Albumin, CSF 24 0 - 35 mg/dL Final   Albumin Index 7.4 0.0 - 9.0 ratio Final   IgG Index, CSF 0.49 0.28 - 0.66  ratio Final   IgG/Albumin Ratio, CSF 0.18 0.09 - 0.25 ratio Final   CSF Oligoclonal Bands Positive Negative Final   Oligoclonal Bands Interpretation   Final    See Note (NOTE)  As compared to the serum, isoelectric focusing/immunofixation reveals 3 IgG bands that are unique to the CSF. This is consistent with intrathecal synthesis of immunoglobulin and is considered to be a positive result for the oligoclonal bands.  Oligoclonal bands are present in over 90% of patients with MS but may also be  present in CSF from patients with viral or bacterial meningeoencephalitis, SSPE, neurosyphilis, Guillain-Barre syndrome, and meningeal carcinomatosis.   MS CNS IgG Synthesis Rate <0.0 <=8.0 mg/d Final  Pathologist smear review     Status: None   Collection Time: 04/27/09  8:30 AM  Result Value Ref Range Status   Tech Review   Final    NUMEROUS LYMPHOCYTES Reviewed by Chrystie Nose. Saralyn Pilar, M.D. 04/28/09  Protein and glucose, CSF     Status: Abnormal   Collection Time: 04/27/09  8:30 AM  Result Value Ref Range Status   Glucose, CSF 158 (H) 43 - 76 mg/dL Final   Total  Protein, CSF 44 15 - 45 mg/dL Final    Coagulation Studies: Recent Labs    10/26/17 1812  LABPROT 14.6  INR 1.15    Imaging: Mr Jeri Cos And Wo Contrast  Result Date: 10/26/2017 CLINICAL DATA:  Initial evaluation for acute headaches, right eye pain and swelling. EXAM: MRI HEAD AND ORBITS WITHOUT AND WITH CONTRAST TECHNIQUE: Multiplanar, multiecho pulse sequences of the brain and surrounding structures were obtained without and with intravenous contrast. Multiplanar, multiecho pulse sequences of the orbits and surrounding structures were obtained including fat saturation techniques, before and after intravenous contrast administration. CONTRAST:  32m MULTIHANCE GADOBENATE DIMEGLUMINE 529 MG/ML IV SOLN COMPARISON:  Prior MRI from 04/21/2009. FINDINGS: MRI HEAD FINDINGS Brain: Cerebral volume within normal limits. Asymmetric confluent T2/FLAIR hyperintensity within the left periatrial white matter, similar to previous. Additional mild T2/FLAIR hyperintensity within the periventricular white matter both cerebral hemispheres, nonspecific, but mildly progressed relative to previous exam. One of these foci position within the right periatrial white matter is oriented perpendicular to the lateral ventricle (series 5, image 14). No abnormal foci of restricted diffusion to suggest acute or subacute ischemia. Gray-white matter differentiation  maintained. No other evidence for chronic infarction. No foci of susceptibility artifact to suggest acute or chronic intracranial hemorrhage. No mass lesion, midline shift or mass effect. No hydrocephalus. No extra-axial fluid collection. Major dural sinuses are grossly patent. No findings to suggest cavernous sinus thrombosis. Pituitary gland suprasellar region within normal limits. Midline structures intact and normal. Vascular: Major intracranial vascular flow voids are maintained. Skull and upper cervical spine: Craniocervical junction normal. Upper cervical spine within normal limits. Bone marrow signal intensity normal. MRI ORBITS FINDINGS Orbits:  Study degraded by motion artifact. Globes are symmetric in size with normal morphology. Optic nerves symmetric in size and normal in appearance. No appreciable optic nerve edema or abnormal enhancement. No abnormality seen at the orbital apices are about the cavernous sinus. Optic chiasm normally position within the suprasellar cistern. Extra-ocular muscles are normal in appearance and symmetric bilaterally. Lacrimal glands are normal. Superior orbital veins symmetric and within normal limits bilaterally. Intraconal and extraconal fat well-maintained. Visualized sinuses: Mild scattered mucoperiosteal thickening within the ethmoidal air cells. Visualized paranasal sinuses are otherwise clear. No air-fluid level to suggest acute sinusitis. Soft tissues: No appreciable periorbital soft tissue abnormality. IMPRESSION:  1. No acute intracranial abnormality. 2. Asymmetric confluent T2/FLAIR hyperintensity within the left periatrial white matter, similar to previous. Additional mild cerebral white matter changes are mildly progressed relative 2010. Findings are nonspecific, and may due to chronic small vessel ischemic changes. However, one of these foci positioned at the right periatrial white matter is oriented perpendicular to the lateral ventricle, which can be seen in  the setting of underlying demyelinating disease. Clinical correlation recommended. No findings to suggest active demyelination on this exam. 3. Normal MRI of the orbits. No abnormality about the cavernous sinus. 4. Mild ethmoidal sinus disease, likely allergic/inflammatory nature. Electronically Signed   By: Jeannine Boga M.D.   On: 10/26/2017 22:21   Mr Rosealee Albee AY Contrast  Result Date: 10/26/2017 CLINICAL DATA:  Initial evaluation for acute headaches, right eye pain and swelling. EXAM: MRI HEAD AND ORBITS WITHOUT AND WITH CONTRAST TECHNIQUE: Multiplanar, multiecho pulse sequences of the brain and surrounding structures were obtained without and with intravenous contrast. Multiplanar, multiecho pulse sequences of the orbits and surrounding structures were obtained including fat saturation techniques, before and after intravenous contrast administration. CONTRAST:  41m MULTIHANCE GADOBENATE DIMEGLUMINE 529 MG/ML IV SOLN COMPARISON:  Prior MRI from 04/21/2009. FINDINGS: MRI HEAD FINDINGS Brain: Cerebral volume within normal limits. Asymmetric confluent T2/FLAIR hyperintensity within the left periatrial white matter, similar to previous. Additional mild T2/FLAIR hyperintensity within the periventricular white matter both cerebral hemispheres, nonspecific, but mildly progressed relative to previous exam. One of these foci position within the right periatrial white matter is oriented perpendicular to the lateral ventricle (series 5, image 14). No abnormal foci of restricted diffusion to suggest acute or subacute ischemia. Gray-white matter differentiation maintained. No other evidence for chronic infarction. No foci of susceptibility artifact to suggest acute or chronic intracranial hemorrhage. No mass lesion, midline shift or mass effect. No hydrocephalus. No extra-axial fluid collection. Major dural sinuses are grossly patent. No findings to suggest cavernous sinus thrombosis. Pituitary gland suprasellar  region within normal limits. Midline structures intact and normal. Vascular: Major intracranial vascular flow voids are maintained. Skull and upper cervical spine: Craniocervical junction normal. Upper cervical spine within normal limits. Bone marrow signal intensity normal. MRI ORBITS FINDINGS Orbits:  Study degraded by motion artifact. Globes are symmetric in size with normal morphology. Optic nerves symmetric in size and normal in appearance. No appreciable optic nerve edema or abnormal enhancement. No abnormality seen at the orbital apices are about the cavernous sinus. Optic chiasm normally position within the suprasellar cistern. Extra-ocular muscles are normal in appearance and symmetric bilaterally. Lacrimal glands are normal. Superior orbital veins symmetric and within normal limits bilaterally. Intraconal and extraconal fat well-maintained. Visualized sinuses: Mild scattered mucoperiosteal thickening within the ethmoidal air cells. Visualized paranasal sinuses are otherwise clear. No air-fluid level to suggest acute sinusitis. Soft tissues: No appreciable periorbital soft tissue abnormality. IMPRESSION: 1. No acute intracranial abnormality. 2. Asymmetric confluent T2/FLAIR hyperintensity within the left periatrial white matter, similar to previous. Additional mild cerebral white matter changes are mildly progressed relative 2010. Findings are nonspecific, and may due to chronic small vessel ischemic changes. However, one of these foci positioned at the right periatrial white matter is oriented perpendicular to the lateral ventricle, which can be seen in the setting of underlying demyelinating disease. Clinical correlation recommended. No findings to suggest active demyelination on this exam. 3. Normal MRI of the orbits. No abnormality about the cavernous sinus. 4. Mild ethmoidal sinus disease, likely allergic/inflammatory nature. Electronically Signed   By: BMarland Kitchen  Jeannine Boga M.D.   On: 10/26/2017 22:21    Assessment: 1. 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 No swelling, scleral injection or fever to suggest orbital cellulitis. MRI negative for signal changes in the cavernous sinus, and there is no fever, proptosis or scleral injection, making cavernous sinus thrombosis unlikely. No vision loss, therefore not an optic neuritis. No proptosis to suggest orbital pseudotumor. Findings not consistent with migraine headache. No vision loss to suggest temporal arteritis. Compression of third cranial nerve by an aneurysm is unlikely given that pupils are normal in size and equally reactive; also, pain is not typical for aneurysmal compression of a cranial nerve.  2. Prior history of transverse myelitis. Tolosa-Hunt syndrome, an autoimmune disorder, is also more likely given the prior history of another autoimmune disorder. 3. MRI brain also reveals white matter findings as follows: Asymmetric confluent T2/FLAIR hyperintensity within the left periatrial white matter, similar to previous. Additional mild cerebral white matter changes are mildly progressed relative 2010. Findings are nonspecific, and may due to chronic small vessel ischemic changes. However, one of these foci positioned at the right periatrial white matter is oriented perpendicular to the lateral ventricle, which can be seen in the setting of underlying demyelinating disease. No findings to suggest active demyelination on this exam.  Recommendations: 1. IV Solumedrol 1000 mg qd x 5 days. Monitor glucose levels carefully.  2. Will need outpatient follow up with Ophthalmology 3. TSH level. In some cases, Graves ophthalmopathy could result in unilateral ocular motility dysfunction, but it would not be expected to be painful.  4. Follow up MRI in one year to assess for stability of the white matter lesions.  5. Outpatient Neurology follow up to assess for full resolution of the ocular motility  deficity, right eye pain following steroid treatment. Also to evaluate for possible MS given findings on MRI brain obtained this admission.   Electronically signed: Dr. Kerney Elbe 10/26/2017, 10:41 PM

## 2017-10-26 NOTE — ED Notes (Signed)
Patient transported to MRI 

## 2017-10-26 NOTE — ED Provider Notes (Signed)
[facility]ERGENCY DEPARTMENT Provider Note   CSN: [id number] Arrival date & time: 10/26/17  1053     History   Chief Complaint Chief Complaint  Patient presents with  . Visual Field Change  . Headache    HPI Tyler Aguirre is a 51 y.o. male.  HPI   51 year old male with extensive past medical history as below including transverse myelitis, obesity, diabetes, who presents with visual field change.  The patient states that for the last several weeks, he has had multiple symptoms.  Symptoms started as fever, aches, body chills, and headache 3 weeks ago.  He was diagnosed with influenza.  He returned to urgent care a week later, due to persistent headache and rhinorrhea with congestion.  He was subsequently given a dose of Decadron and antibiotics.  This improved his sinus symptoms but has had persistent right-sided eye pain since then.  He was seen again on 12/3 and sent to an ophthalmologist, who did show disc edema and was advised to obtain an MRI today.  However, on awakening this morning, the patient had a worsening headache.  Described as aching, throbbing, right-sided.  He has associated double vision, that improves when he covers up either 1 of his eyes.  He is also had mild constipation which is not abnormal for him.  He has a history of hemorrhoids and states he had a diffuse streaks of blood in his stool today.  The stool was otherwise brown and well formed.  No vomiting.  No abdominal pain.  No other medical complaints.  No neck stiffness.  Past Medical History:  Diagnosis Date  . ALLERGIC RHINITIS 03/12/2009  . DIABETES MELLITUS, TYPE II 02/26/2007  . [place] DISEASE, LUMBAR 06/16/2009  . HYPERLIPIDEMIA 02/26/2007  . HYPERTENSION 02/26/2007  . NUMBNESS 04/17/2009  . Overweight(278.02) 02/26/2007  . PROSTATITIS, ACUTE 04/17/2009  . TRANSVERSE MYELITIS 05/01/2009    Patient Active Problem List   Diagnosis Date Noted  . Vision changes 10/26/2017  . Insomnia  08/11/2017  . Chronic diastolic heart failure ([place]) 10/31/2016  . Severe obesity (BMI 35.0-39.9) with comorbidity ([place]) 08/12/2016  . [place] DISEASE, LUMBAR 06/16/2009  . TRANSVERSE MYELITIS 05/01/2009  . PROSTATITIS, ACUTE 04/17/2009  . NUMBNESS 04/17/2009  . Allergic rhinitis 03/12/2009  . 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 Hyperlipidemia, unspecified 02/26/2007  . OVERWEIGHT 02/26/2007  . Essential hypertension 02/26/2007    Past Surgical History:  Procedure Laterality Date  . COLONOSCOPY    . DENTAL SURGERY    . POLYPECTOMY         Home Medications    Prior to Admission medications   Medication Sig Start Date End Date Taking? Authorizing Provider  Aspirin-Acetaminophen-Caffeine (GOODY HEADACHE PO) Take 1 Package by mouth every 2 (two) hours as needed (pain).    Yes [provider]  azelastine (ASTELIN) 0.1 % nasal spray Place 2 sprays into both nostrils 2 (two) times daily. Use in each nostril as directed 08/11/17  Yes Martinique, Betty G, MD  Insulin Glargine (TOUJEO SOLOSTAR) 300 UNIT/ML SOPN Inject 65 Units into the skin at bedtime. 08/08/17  Yes Philemon Kingdom, MD  irbesartan (AVAPRO) 150 MG tablet Take 1 tablet (150 mg total) by mouth daily. 09/01/16  Yes Croitoru, Mihai, MD  NOVOLOG FLEXPEN 100 UNIT/ML FlexPen INJECT 40-50 UNITS 3 TIMES DAILY 10/10/17  Yes Philemon Kingdom, MD  PROCTOSOL HC 2.5 % rectal cream APPLY RECTALLY TWICE DAILY 03/29/17  Yes Martinique, Betty G, MD  [facility] Lancet Super Thin 30G MISC by Does not apply route. As direceted     [provider]  Continuous Blood Gluc Receiver (FREESTYLE LIBRE READER) DEVI 1 Device by Does not apply route 3 (three) times daily. 08/08/17   Philemon Kingdom, MD  Continuous Blood Gluc Sensor (FREESTYLE LIBRE SENSOR SYSTEM) MISC 1 Device by Does not apply route every 30 (thirty) days. 08/08/17   Philemon Kingdom, MD  glucose blood test strip 1  each by Other route as needed. Use as instructed 12/02/16   Philemon Kingdom, MD  Insulin Pen Needle (CAREFINE PEN NEEDLES) 32G X 4 MM MISC Use 4x a day 08/08/17   Philemon Kingdom, MD    Family History Family History  Problem Relation Age of Onset  . Heart disease Mother        valve dz  . Arthritis Mother        RA  . Anemia Mother   . Hypertension Father   . Heart disease Father   . Heart disease Maternal Uncle        CAD  . Diabetes Paternal Grandmother   . Obesity Paternal Grandmother   . Hypertension Paternal Grandmother   . Cancer Paternal Grandfather        lung  . Mental retardation Paternal Grandfather        suicided  . Hypertension Paternal Grandfather   . Diabetes Paternal Grandfather   . Obesity Maternal Grandfather   . Hypertension Maternal Grandfather   . Stroke Paternal Uncle   . Cancer Paternal Uncle     Social History Social History   Tobacco Use  . Smoking status: Never Smoker  . Smokeless tobacco: Never Used  Substance Use Topics  . Alcohol use: Yes  . Drug use: No     Allergies   Ibuprofen and Metformin and related   Review of Systems Review of Systems  Constitutional: Positive for fatigue. Negative for fever.  Eyes: Positive for pain and visual disturbance.  Neurological: Positive for headaches.  All other systems reviewed and are negative.    Physical Exam Updated Vital Signs BP (!) 168/68 (BP Location: Right Arm)   Pulse 83   Temp 98.9 F (37.2 C) (Oral)   Resp 16   Ht 6' (1.829 m)   Wt 136.1 kg (300 lb)   SpO2 100%   BMI 40.69 kg/m   Physical Exam  Constitutional: He is oriented to person, place, and time. He appears well-developed and well-nourished. No distress.  HENT:  Head: Normocephalic and atraumatic.  Moderate nasal mucosal edema without purulent discharge.  Oropharynx clear.  Mildly dry mucous membranes.  Eyes: Conjunctivae are normal.  No conjunctival injection.  Disconjugate gaze noted with positive skew.    Neck: Neck supple.  Cardiovascular: Normal rate, regular rhythm and normal heart sounds. Exam reveals no friction rub.  No murmur heard. Pulmonary/Chest: Effort normal and breath sounds normal. No respiratory distress. He has no wheezes. He has no rales.  Abdominal: He exhibits no distension.  Genitourinary:  Genitourinary Comments: Nonthrombosed external hemorrhoid  Musculoskeletal: He exhibits no edema.  Neurological: He is alert and oriented to person, place, and time. He has normal strength. A cranial nerve deficit (Disconjugate gaze) is present. No sensory deficit. He exhibits normal muscle tone. Coordination and gait normal. GCS eye subscore is 4. GCS verbal subscore is 5. GCS motor subscore is 6.  Skin: Skin is warm. Capillary refill takes less than 2 seconds.  Psychiatric:  He has a normal mood and affect.  Nursing note and vitals reviewed.    ED Treatments / Results  Labs (all labs ordered are listed, but only abnormal results are displayed) Labs Reviewed  COMPREHENSIVE METABOLIC PANEL - Abnormal; Notable for the following components:      Result Value   Sodium 133 (*)    CO2 20 (*)    Glucose, Bld 393 (*)    Calcium 8.5 (*)    Albumin 3.0 (*)    AST 68 (*)    ALT 65 (*)    Total Bilirubin 1.7 (*)    All other components within normal limits  CBC WITH DIFFERENTIAL/PLATELET - Abnormal; Notable for the following components:   Platelets 107 (*)    All other components within normal limits  SEDIMENTATION RATE - Abnormal; Notable for the following components:   Sed Rate 34 (*)    All other components within normal limits  CBG MONITORING, ED - Abnormal; Notable for the following components:   Glucose-Capillary 232 (*)    All other components within normal limits  C-REACTIVE PROTEIN  PROTIME-INR  CBC  BASIC METABOLIC PANEL    EKG  EKG Interpretation None       Radiology Mr Jeri Cos And Wo Contrast  Result Date: 10/26/2017 CLINICAL DATA:  Initial evaluation for  acute headaches, right eye pain and swelling. EXAM: MRI HEAD AND ORBITS WITHOUT AND WITH CONTRAST TECHNIQUE: Multiplanar, multiecho pulse sequences of the brain and surrounding structures were obtained without and with intravenous contrast. Multiplanar, multiecho pulse sequences of the orbits and surrounding structures were obtained including fat saturation techniques, before and after intravenous contrast administration. CONTRAST:  29m MULTIHANCE GADOBENATE DIMEGLUMINE 529 MG/ML IV SOLN COMPARISON:  Prior MRI from 04/21/2009. FINDINGS: MRI HEAD FINDINGS Brain: Cerebral volume within normal limits. Asymmetric confluent T2/FLAIR hyperintensity within the left periatrial white matter, similar to previous. Additional mild T2/FLAIR hyperintensity within the periventricular white matter both cerebral hemispheres, nonspecific, but mildly progressed relative to previous exam. One of these foci position within the right periatrial white matter is oriented perpendicular to the lateral ventricle (series 5, image 14). No abnormal foci of restricted diffusion to suggest acute or subacute ischemia. Gray-white matter differentiation maintained. No other evidence for chronic infarction. No foci of susceptibility artifact to suggest acute or chronic intracranial hemorrhage. No mass lesion, midline shift or mass effect. No hydrocephalus. No extra-axial fluid collection. Major dural sinuses are grossly patent. No findings to suggest cavernous sinus thrombosis. Pituitary gland suprasellar region within normal limits. Midline structures intact and normal. Vascular: Major intracranial vascular flow voids are maintained. Skull and upper cervical spine: Craniocervical junction normal. Upper cervical spine within normal limits. Bone marrow signal intensity normal. MRI ORBITS FINDINGS Orbits:  Study degraded by motion artifact. Globes are symmetric in size with normal morphology. Optic nerves symmetric in size and normal in appearance. No  appreciable optic nerve edema or abnormal enhancement. No abnormality seen at the orbital apices are about the cavernous sinus. Optic chiasm normally position within the suprasellar cistern. Extra-ocular muscles are normal in appearance and symmetric bilaterally. Lacrimal glands are normal. Superior orbital veins symmetric and within normal limits bilaterally. Intraconal and extraconal fat well-maintained. Visualized sinuses: Mild scattered mucoperiosteal thickening within the ethmoidal air cells. Visualized paranasal sinuses are otherwise clear. No air-fluid level to suggest acute sinusitis. Soft tissues: No appreciable periorbital soft tissue abnormality. IMPRESSION: 1. No acute intracranial abnormality. 2. Asymmetric confluent T2/FLAIR hyperintensity within the left periatrial white matter, similar to  previous. Additional mild cerebral white matter changes are mildly progressed relative 2010. Findings are nonspecific, and may due to chronic small vessel ischemic changes. However, one of these foci positioned at the right periatrial white matter is oriented perpendicular to the lateral ventricle, which can be seen in the setting of underlying demyelinating disease. Clinical correlation recommended. No findings to suggest active demyelination on this exam. 3. Normal MRI of the orbits. No abnormality about the cavernous sinus. 4. Mild ethmoidal sinus disease, likely allergic/inflammatory nature. Electronically Signed   By: Jeannine Boga M.D.   On: 10/26/2017 22:21   Mr Rosealee Albee ZR Contrast  Result Date: 10/26/2017 CLINICAL DATA:  Initial evaluation for acute headaches, right eye pain and swelling. EXAM: MRI HEAD AND ORBITS WITHOUT AND WITH CONTRAST TECHNIQUE: Multiplanar, multiecho pulse sequences of the brain and surrounding structures were obtained without and with intravenous contrast. Multiplanar, multiecho pulse sequences of the orbits and surrounding structures were obtained including fat saturation  techniques, before and after intravenous contrast administration. CONTRAST:  23m MULTIHANCE GADOBENATE DIMEGLUMINE 529 MG/ML IV SOLN COMPARISON:  Prior MRI from 04/21/2009. FINDINGS: MRI HEAD FINDINGS Brain: Cerebral volume within normal limits. Asymmetric confluent T2/FLAIR hyperintensity within the left periatrial white matter, similar to previous. Additional mild T2/FLAIR hyperintensity within the periventricular white matter both cerebral hemispheres, nonspecific, but mildly progressed relative to previous exam. One of these foci position within the right periatrial white matter is oriented perpendicular to the lateral ventricle (series 5, image 14). No abnormal foci of restricted diffusion to suggest acute or subacute ischemia. Gray-white matter differentiation maintained. No other evidence for chronic infarction. No foci of susceptibility artifact to suggest acute or chronic intracranial hemorrhage. No mass lesion, midline shift or mass effect. No hydrocephalus. No extra-axial fluid collection. Major dural sinuses are grossly patent. No findings to suggest cavernous sinus thrombosis. Pituitary gland suprasellar region within normal limits. Midline structures intact and normal. Vascular: Major intracranial vascular flow voids are maintained. Skull and upper cervical spine: Craniocervical junction normal. Upper cervical spine within normal limits. Bone marrow signal intensity normal. MRI ORBITS FINDINGS Orbits:  Study degraded by motion artifact. Globes are symmetric in size with normal morphology. Optic nerves symmetric in size and normal in appearance. No appreciable optic nerve edema or abnormal enhancement. No abnormality seen at the orbital apices are about the cavernous sinus. Optic chiasm normally position within the suprasellar cistern. Extra-ocular muscles are normal in appearance and symmetric bilaterally. Lacrimal glands are normal. Superior orbital veins symmetric and within normal limits  bilaterally. Intraconal and extraconal fat well-maintained. Visualized sinuses: Mild scattered mucoperiosteal thickening within the ethmoidal air cells. Visualized paranasal sinuses are otherwise clear. No air-fluid level to suggest acute sinusitis. Soft tissues: No appreciable periorbital soft tissue abnormality. IMPRESSION: 1. No acute intracranial abnormality. 2. Asymmetric confluent T2/FLAIR hyperintensity within the left periatrial white matter, similar to previous. Additional mild cerebral white matter changes are mildly progressed relative 2010. Findings are nonspecific, and may due to chronic small vessel ischemic changes. However, one of these foci positioned at the right periatrial white matter is oriented perpendicular to the lateral ventricle, which can be seen in the setting of underlying demyelinating disease. Clinical correlation recommended. No findings to suggest active demyelination on this exam. 3. Normal MRI of the orbits. No abnormality about the cavernous sinus. 4. Mild ethmoidal sinus disease, likely allergic/inflammatory nature. Electronically Signed   By: BJeannine BogaM.D.   On: 10/26/2017 22:21    Procedures Procedures (including critical care time)  Medications  Ordered in ED Medications  irbesartan (AVAPRO) tablet 150 mg (not administered)  Insulin Glargine SOPN 65 Units (not administered)  sodium chloride flush (NS) 0.9 % injection 3 mL (not administered)  sodium chloride flush (NS) 0.9 % injection 3 mL (not administered)  0.9 %  sodium chloride infusion (not administered)  acetaminophen (TYLENOL) tablet 650 mg (not administered)    Or  acetaminophen (TYLENOL) suppository 650 mg (not administered)  insulin aspart (novoLOG) injection 0-9 Units (not administered)  sodium chloride 0.9 % bolus 1,000 mL (0 mLs Intravenous Stopped 10/26/17 1751)  prochlorperazine (COMPAZINE) injection 10 mg (10 mg Intravenous Given 10/26/17 1653)  diphenhydrAMINE (BENADRYL) injection 25  mg (25 mg Intravenous Given 10/26/17 1653)  gadobenate dimeglumine (MULTIHANCE) injection 20 mL (20 mLs Intravenous Contrast Given 10/26/17 2109)     Initial Impression / Assessment and Plan / ED Course  I have reviewed the triage vital signs and the nursing notes.  Pertinent labs & imaging results that were available during my care of the patient were reviewed by me and considered in my medical decision making (see chart for details).     51 year old male here with binocular diplopia and right retrobulbar headache.  Initial differential includes optic neuritis, orbital cellulitis, stroke, cavernous sinus thrombosis.  MRI ordered secondary to persistent symptoms with mild worsening.  Regarding his reported blood in stools, he has a mildly painful, external hemorrhoid.  Stool is otherwise soft and brown with normal hemoglobin.  Doubt GI bleed.  MRI obtained.  There is a question of possible demyelinating disease, though this distribution does not is fairly fit his symptoms.  I discussed with Dr. Cheral Marker of neurology.  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  He recommends admission and will see the patient. May need IV steorids. Pt updated and in agreement.  Final Clinical Impressions(s) / ED Diagnoses   Final diagnoses:  Visual disturbance  Bad headache    ED Discharge Orders    None       Duffy Bruce, MD 10/27/17 Benancio Deeds

## 2017-10-27 ENCOUNTER — Encounter (HOSPITAL_COMMUNITY): Payer: Self-pay | Admitting: General Practice

## 2017-10-27 ENCOUNTER — Other Ambulatory Visit: Payer: Self-pay

## 2017-10-27 DIAGNOSIS — I5032 Chronic diastolic (congestive) heart failure: Secondary | ICD-10-CM | POA: Diagnosis not present

## 2017-10-27 DIAGNOSIS — I10 Essential (primary) hypertension: Secondary | ICD-10-CM | POA: Diagnosis not present

## 2017-10-27 DIAGNOSIS — H539 Unspecified visual disturbance: Secondary | ICD-10-CM | POA: Diagnosis not present

## 2017-10-27 LAB — CBC
HEMATOCRIT: 38.8 % — AB (ref 39.0–52.0)
Hemoglobin: 13.1 g/dL (ref 13.0–17.0)
MCH: 31.9 pg (ref 26.0–34.0)
MCHC: 33.8 g/dL (ref 30.0–36.0)
MCV: 94.4 fL (ref 78.0–100.0)
PLATELETS: 106 10*3/uL — AB (ref 150–400)
RBC: 4.11 MIL/uL — ABNORMAL LOW (ref 4.22–5.81)
RDW: 13.3 % (ref 11.5–15.5)
WBC: 7.1 10*3/uL (ref 4.0–10.5)

## 2017-10-27 LAB — TSH: TSH: 4.665 u[IU]/mL — AB (ref 0.350–4.500)

## 2017-10-27 LAB — GLUCOSE, CAPILLARY
GLUCOSE-CAPILLARY: 369 mg/dL — AB (ref 65–99)
GLUCOSE-CAPILLARY: 402 mg/dL — AB (ref 65–99)
Glucose-Capillary: 232 mg/dL — ABNORMAL HIGH (ref 65–99)
Glucose-Capillary: 305 mg/dL — ABNORMAL HIGH (ref 65–99)

## 2017-10-27 LAB — BASIC METABOLIC PANEL
Anion gap: 8 (ref 5–15)
BUN: 10 mg/dL (ref 6–20)
CALCIUM: 8.3 mg/dL — AB (ref 8.9–10.3)
CO2: 24 mmol/L (ref 22–32)
Chloride: 103 mmol/L (ref 101–111)
Creatinine, Ser: 0.75 mg/dL (ref 0.61–1.24)
GFR calc Af Amer: >60 mL/min (ref >60)
GLUCOSE: 288 mg/dL — AB (ref 65–99)
Potassium: 3.8 mmol/L (ref 3.5–5.1)
Sodium: 135 mmol/L (ref 135–145)

## 2017-10-27 MED ORDER — AZELASTINE HCL 0.1 % NA SOLN
2.0000 | Freq: Two times a day (BID) | NASAL | Status: DC
  Administered 2017-10-27 – 2017-10-28 (×4): 2 via NASAL
  Filled 2017-10-27: qty 30

## 2017-10-27 MED ORDER — SODIUM CHLORIDE 0.9 % IV SOLN
1000.0000 mg | Freq: Every day | INTRAVENOUS | Status: DC
  Administered 2017-10-27 – 2017-10-28 (×2): 1000 mg via INTRAVENOUS
  Filled 2017-10-27 (×2): qty 8

## 2017-10-27 NOTE — Progress Notes (Addendum)
Reason for consult: Right orbit pain and double vision  Subjective: Patient reports that symptoms began approximately 3 weeks ago. He was seen by his general practitioner 3 weeks and diagnosed with influenza. Three days later he developed right orbital pain and headache. Thought to be a sinus infection and was treated with antibiotics without resolution of symptoms. He saw an ophthalmologist, Dr. Allena KatzPatel at [facility] this past Monday on 12/3. Reports being told at that time that he had papilledema in his right eye but otherwise had normal eye examination. He was recommended to have an MRI brain and orbits. He was scheduled to have this done as an outpatient but presented to the ED last night with worsening headache and double vision.   He reports double vision when using both eyes but resolution of this if he closes one eye. Denies any blurry vision. MRI brain and orbits without any acute intracranial abnormalities. Was noted to have asymmetric hyperintensity in the left pari atrial white matter as well as foci in the right periatrial white matter.   ROS: negative except above  Examination  Vital signs in last 24 hours: Temp:  [97.5 F (36.4 C)-98.9 F (37.2 C)] 97.5 F (36.4 C) (12/07 0900) Pulse Rate:  [67-101] 76 (12/07 0900) Resp:  [16-24] 18 (12/07 0900) BP: (116-193)/(54-93) 167/81 (12/07 0900) SpO2:  [93 %-100 %] 99 % (12/07 0900) Weight:  [294 lb 8.6 oz (133.6 kg)] 294 lb 8.6 oz (133.6 kg) (12/07 0028)  General: Not in distress, cooperative CVS: pulse-normal rate and rhythm RS: breathing comfortably Extremities: normal   Neuro: MS: Alert, oriented, follows commands Fundus: blurred margins of optic nerve in R eye CN: pupils equal and reactive, EOMI with one eye closed, however has INO in right eye, face symmetric, tongue midline, normal sensation over face, normal visual acuity in both eyes  Motor: 5/5 strength in all 4 extremities Reflexes: 2+ bilaterally over patella,  biceps, plantars: flexor Coordination: normal Gait: not tested  Basic Metabolic Panel: Recent Labs  Lab 10/26/17 1114 10/27/17 0314  NA 133* 135  K 4.3 3.8  CL 103 103  CO2 20* 24  GLUCOSE 393* 288*  BUN 12 10  CREATININE 0.75 0.75  CALCIUM 8.5* 8.3*    CBC: Recent Labs  Lab 10/26/17 1114 10/27/17 0314  WBC 7.6 7.1  NEUTROABS 5.2  --   HGB 14.4 13.1  HCT 41.9 38.8*  MCV 94.4 94.4  PLT 107* 106*     Coagulation Studies: Recent Labs    10/26/17 1812  LABPROT 14.6  INR 1.15    Imaging IMPRESSION: 1. No acute intracranial abnormality. 2. Asymmetric confluent T2/FLAIR hyperintensity within the left periatrial white matter, similar to previous. Additional mild cerebral white matter changes are mildly progressed relative 2010. Findings are nonspecific, and may due to chronic small vessel ischemic changes. However, one of these foci positioned at the right periatrial white matter is oriented perpendicular to the lateral ventricle, which can be seen in the setting of underlying demyelinating disease. Clinical correlation recommended. No findings to suggest active demyelination on this exam. 3. Normal MRI of the orbits. No abnormality about the cavernous sinus. 4. Mild ethmoidal sinus disease, likely allergic/inflammatory nature.    Valentino NoseNathan Boswell, MD Internal Medicine Teaching Program PGY-3    NEUROHOSPITALIST ADDENDUM Seen and examined the patient this AM along with resident. Formulated plan as documented above. Recommendations as below.  ASSESSMENT AND PLAN  51 year old male with 3 week history of right eye orbital pain  and double vision. Clinically without any evidence of sinus  evidence of orbital cellulitis. MRI brain showed no evidence of cavernous sinus thrombosis/granulomatous or obstructive lesion in right orbit.   Examination felt more consistent with INO rather than third nerve palsy as no clear ophthalmoplegia/ medial rectus palsy with ptosis  was not present on my exam. Less likely optic neuritis given his normal visual acuity. However patient does feel pain/pressure behind his eye which can occur in [place] syndrome, but ophthalmoplegia not as significant as expected.    Discussed with Dr. Allena KatzPatel over the phone and he noted he appreciated bilateral papilledema with R>L. 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 Will try to have records of his office visit faxed over and placed in the shadow chart. Will hold off on steroids currently. Will obtain an MRI of his c-spine and t-spine to assess for any active spinal cord lesions given his MRI brian findings that may represent MS.     Georgiana SpinnerSushanth Kimba Lottes MD [facility] [phone]  If 7pm to 7am, please call on call as listed on AMION.

## 2017-10-27 NOTE — Progress Notes (Signed)
Arrived from Ed. Alert and oriented. C/o of rt eye pain 2/10. Ambulatory. Gait stable. Oriented to room.

## 2017-10-27 NOTE — Care Management Note (Signed)
Case Management Note  Patient Details  Name: Tyler Aguirre MRN: [id number] Date of Birth: [birth year]  Subjective/Objective:    Pt admitted with vision changes. R/o autoimmune disorder. He is from home.                Action/Plan: Plan is for patient to return home when medically stable. CM following.  Expected Discharge Date:                  Expected Discharge Plan:  Home/Self Care  In-House Referral:     Discharge planning Services     Post Acute Care Choice:    Choice offered to:     DME Arranged:    DME Agency:     HH Arranged:    HH Agency:     Status of Service:  In process, will continue to follow  If discussed at Long Length of Stay Meetings, dates discussed:    Additional Comments:  Pollie Friar, RN 10/27/2017, 1:39 PM

## 2017-10-27 NOTE — Progress Notes (Signed)
PROGRESS NOTE    Tyler Aguirre  DDU:[id number] DOB: [birth year] DOA: 10/26/2017 PCP: [place], Betty G, MD    Brief Narrative:  CC: Double vision and right eye pain. 51 y.o. male with medical history significant of DM, HTN with recent flu 3 weeks ago comes in with almost 3 weeks of right eye pain and vision changes.  Pt has seen ophthalmogy as outpt who noted papilledema in right eye and ordered an MRI.    Assessment & Plan:   Principal Problem:   Vision changes - Neurology currently managing. - question is whether this is an optic neuritis vs an autoimmune problem - plan is for further work up with MRI of spine. Please see neurology notes.  Active Problems:   Hyperlipidemia, unspecified -  Stable, no chest pain reported.    Essential hypertension - Pt is currently on avapro blood pressures have fluctuated     Chronic diastolic heart failure (HCC) - stable and compensated currently  DVT prophylaxis: SCD's Code Status: Full Family Communication: None at bedside. Disposition Plan: pending improvement in condition   Consultants:   Consult Neurology   Procedures:    Antimicrobials: None   Subjective: Pt has no new complaints.   Objective: Vitals:   10/26/17 2344 10/27/17 0028 10/27/17 0427 10/27/17 0900  BP: (!) 168/68 (!) 193/80 (!) 167/59 (!) 167/81  Pulse: 83 70 76 76  Resp: _0 Temp: 98.9 F (37.2 C) 98.5 F (36.9 C) 98.2 F (36.8 C) (!) 97.5 F (36.4 C)  TempSrc: Oral Oral Oral   SpO2: 100% 100% 99% 99%  Weight:  133.6 kg (294 lb 8.6 oz)    Height:  6' (1.829 m)     No intake or output data in the 24 hours ending 10/27/17 1730 Filed Weights   10/26/17 1103 10/26/17 1112 10/27/17 0028  Weight: 136.1 kg (300 lb) 136.1 kg (300 lb) 133.6 kg (294 lb 8.6 oz)    Examination:  General exam: Pt in nad, alert and awake Respiratory system: Clear to auscultation. Respiratory effort normal. Cardiovascular system: S1 & S2 heard, RRR. No JVD,  murmurs, rubs, gallops  Gastrointestinal system: Abdomen is nondistended, soft and nontender. No organomegaly or masses felt. Normal bowel sounds heard. Central nervous system: no facial asymmetry. Moves extremities equally Extremities: Symmetric 5 x 5 power. Skin: No rashes, lesions or ulcers, on limited exam. Psychiatry: Judgement and insight appear normal. Mood & affect appropriate.   Data Reviewed: I have personally reviewed following labs and imaging studies  CBC: Recent Labs  Lab 10/26/17 1114 10/27/17 0314  WBC 7.6 7.1  NEUTROABS 5.2  --   HGB 14.4 13.1  HCT 41.9 38.8*  MCV 94.4 94.4  PLT 107* 237*   Basic Metabolic Panel: Recent Labs  Lab 10/26/17 1114 10/27/17 0314  NA 133* 135  K 4.3 3.8  CL 103 103  CO2 20* 24  GLUCOSE 393* 288*  BUN 12 10  CREATININE 0.75 0.75  CALCIUM 8.5* 8.3*   GFR: Estimated Creatinine Clearance: 156.3 mL/min (by C-G formula based on SCr of 0.75 mg/dL). Liver Function Tests: Recent Labs  Lab 10/26/17 1114  AST 68*  ALT 65*  ALKPHOS 107  BILITOT 1.7*  PROT 7.2  ALBUMIN 3.0*   No results for input(s): LIPASE, AMYLASE in the last 168 hours. No results for input(s): AMMONIA in the last 168 hours. Coagulation Profile: Recent Labs  Lab 10/26/17 1812  INR 1.15   Cardiac Enzymes: No results for input(s):  CKTOTAL, CKMB, CKMBINDEX, TROPONINI in the last 168 hours. BNP (last 3 results) No results for input(s): PROBNP in the last 8760 hours. HbA1C: No results for input(s): HGBA1C in the last 72 hours. CBG: Recent Labs  Lab 10/26/17 2351 10/27/17 0616 10/27/17 1143 10/27/17 1625  GLUCAP 232* 232* 305* 369*   Lipid Profile: No results for input(s): CHOL, HDL, LDLCALC, TRIG, CHOLHDL, LDLDIRECT in the last 72 hours. Thyroid Function Tests: Recent Labs    10/27/17 0934  TSH 4.665*   Anemia Panel: No results for input(s): VITAMINB12, FOLATE, FERRITIN, TIBC, IRON, RETICCTPCT in the last 72 hours. Sepsis Labs: No results  for input(s): PROCALCITON, LATICACIDVEN in the last 168 hours.  No results found for this or any previous visit (from the past 240 hour(s)).       Radiology Studies: Mr Jeri Cos And Wo Contrast  Result Date: 10/26/2017 CLINICAL DATA:  Initial evaluation for acute headaches, right eye pain and swelling. EXAM: MRI HEAD AND ORBITS WITHOUT AND WITH CONTRAST TECHNIQUE: Multiplanar, multiecho pulse sequences of the brain and surrounding structures were obtained without and with intravenous contrast. Multiplanar, multiecho pulse sequences of the orbits and surrounding structures were obtained including fat saturation techniques, before and after intravenous contrast administration. CONTRAST:  5m MULTIHANCE GADOBENATE DIMEGLUMINE 529 MG/ML IV SOLN COMPARISON:  Prior MRI from 04/21/2009. FINDINGS: MRI HEAD FINDINGS Brain: Cerebral volume within normal limits. Asymmetric confluent T2/FLAIR hyperintensity within the left periatrial white matter, similar to previous. Additional mild T2/FLAIR hyperintensity within the periventricular white matter both cerebral hemispheres, nonspecific, but mildly progressed relative to previous exam. One of these foci position within the right periatrial white matter is oriented perpendicular to the lateral ventricle (series 5, image 14). No abnormal foci of restricted diffusion to suggest acute or subacute ischemia. Gray-white matter differentiation maintained. No other evidence for chronic infarction. No foci of susceptibility artifact to suggest acute or chronic intracranial hemorrhage. No mass lesion, midline shift or mass effect. No hydrocephalus. No extra-axial fluid collection. Major dural sinuses are grossly patent. No findings to suggest cavernous sinus thrombosis. Pituitary gland suprasellar region within normal limits. Midline structures intact and normal. Vascular: Major intracranial vascular flow voids are maintained. Skull and upper cervical spine: Craniocervical  junction normal. Upper cervical spine within normal limits. Bone marrow signal intensity normal. MRI ORBITS FINDINGS Orbits:  Study degraded by motion artifact. Globes are symmetric in size with normal morphology. Optic nerves symmetric in size and normal in appearance. No appreciable optic nerve edema or abnormal enhancement. No abnormality seen at the orbital apices are about the cavernous sinus. Optic chiasm normally position within the suprasellar cistern. Extra-ocular muscles are normal in appearance and symmetric bilaterally. Lacrimal glands are normal. Superior orbital veins symmetric and within normal limits bilaterally. Intraconal and extraconal fat well-maintained. Visualized sinuses: Mild scattered mucoperiosteal thickening within the ethmoidal air cells. Visualized paranasal sinuses are otherwise clear. No air-fluid level to suggest acute sinusitis. Soft tissues: No appreciable periorbital soft tissue abnormality. IMPRESSION: 1. No acute intracranial abnormality. 2. Asymmetric confluent T2/FLAIR hyperintensity within the left periatrial white matter, similar to previous. Additional mild cerebral white matter changes are mildly progressed relative 2010. Findings are nonspecific, and may due to chronic small vessel ischemic changes. However, one of these foci positioned at the right periatrial white matter is oriented perpendicular to the lateral ventricle, which can be seen in the setting of underlying demyelinating disease. Clinical correlation recommended. No findings to suggest active demyelination on this exam. 3. Normal MRI of the orbits.  No abnormality about the cavernous sinus. 4. Mild ethmoidal sinus disease, likely allergic/inflammatory nature. Electronically Signed   By: Jeannine Boga M.D.   On: 10/26/2017 22:21   Mr Rosealee Albee AO Contrast  Result Date: 10/26/2017 CLINICAL DATA:  Initial evaluation for acute headaches, right eye pain and swelling. EXAM: MRI HEAD AND ORBITS WITHOUT AND  WITH CONTRAST TECHNIQUE: Multiplanar, multiecho pulse sequences of the brain and surrounding structures were obtained without and with intravenous contrast. Multiplanar, multiecho pulse sequences of the orbits and surrounding structures were obtained including fat saturation techniques, before and after intravenous contrast administration. CONTRAST:  11m MULTIHANCE GADOBENATE DIMEGLUMINE 529 MG/ML IV SOLN COMPARISON:  Prior MRI from 04/21/2009. FINDINGS: MRI HEAD FINDINGS Brain: Cerebral volume within normal limits. Asymmetric confluent T2/FLAIR hyperintensity within the left periatrial white matter, similar to previous. Additional mild T2/FLAIR hyperintensity within the periventricular white matter both cerebral hemispheres, nonspecific, but mildly progressed relative to previous exam. One of these foci position within the right periatrial white matter is oriented perpendicular to the lateral ventricle (series 5, image 14). No abnormal foci of restricted diffusion to suggest acute or subacute ischemia. Gray-white matter differentiation maintained. No other evidence for chronic infarction. No foci of susceptibility artifact to suggest acute or chronic intracranial hemorrhage. No mass lesion, midline shift or mass effect. No hydrocephalus. No extra-axial fluid collection. Major dural sinuses are grossly patent. No findings to suggest cavernous sinus thrombosis. Pituitary gland suprasellar region within normal limits. Midline structures intact and normal. Vascular: Major intracranial vascular flow voids are maintained. Skull and upper cervical spine: Craniocervical junction normal. Upper cervical spine within normal limits. Bone marrow signal intensity normal. MRI ORBITS FINDINGS Orbits:  Study degraded by motion artifact. Globes are symmetric in size with normal morphology. Optic nerves symmetric in size and normal in appearance. No appreciable optic nerve edema or abnormal enhancement. No abnormality seen at the  orbital apices are about the cavernous sinus. Optic chiasm normally position within the suprasellar cistern. Extra-ocular muscles are normal in appearance and symmetric bilaterally. Lacrimal glands are normal. Superior orbital veins symmetric and within normal limits bilaterally. Intraconal and extraconal fat well-maintained. Visualized sinuses: Mild scattered mucoperiosteal thickening within the ethmoidal air cells. Visualized paranasal sinuses are otherwise clear. No air-fluid level to suggest acute sinusitis. Soft tissues: No appreciable periorbital soft tissue abnormality. IMPRESSION: 1. No acute intracranial abnormality. 2. Asymmetric confluent T2/FLAIR hyperintensity within the left periatrial white matter, similar to previous. Additional mild cerebral white matter changes are mildly progressed relative 2010. Findings are nonspecific, and may due to chronic small vessel ischemic changes. However, one of these foci positioned at the right periatrial white matter is oriented perpendicular to the lateral ventricle, which can be seen in the setting of underlying demyelinating disease. Clinical correlation recommended. No findings to suggest active demyelination on this exam. 3. Normal MRI of the orbits. No abnormality about the cavernous sinus. 4. Mild ethmoidal sinus disease, likely allergic/inflammatory nature. Electronically Signed   By: BJeannine BogaM.D.   On: 10/26/2017 22:21    Scheduled Meds: . azelastine  2 spray Each Nare BID  . insulin aspart  0-9 Units Subcutaneous TID WC  . insulin glargine  65 Units Subcutaneous QHS  . irbesartan  150 mg Oral Daily  . sodium chloride flush  3 mL Intravenous Q12H   Continuous Infusions: . sodium chloride    . methylPREDNISolone (SOLU-MEDROL) injection Stopped (10/27/17 1146)     LOS: 0 days    Time spent: > 35 minutes  Velvet Bathe, MD [facility] Pager [phone]  If 7PM-7AM, please contact  night-coverage [email or web address] Password [email or web address] 10/27/2017, 5:30 PM

## 2017-10-28 DIAGNOSIS — I10 Essential (primary) hypertension: Secondary | ICD-10-CM | POA: Diagnosis not present

## 2017-10-28 DIAGNOSIS — H539 Unspecified visual disturbance: Secondary | ICD-10-CM | POA: Diagnosis not present

## 2017-10-28 DIAGNOSIS — I5032 Chronic diastolic (congestive) heart failure: Secondary | ICD-10-CM | POA: Diagnosis not present

## 2017-10-28 LAB — GLUCOSE, CAPILLARY
GLUCOSE-CAPILLARY: 299 mg/dL — AB (ref 65–99)
GLUCOSE-CAPILLARY: 425 mg/dL — AB (ref 65–99)
GLUCOSE-CAPILLARY: 222 mg/dL — AB (ref 65–99)
GLUCOSE-CAPILLARY: 305 mg/dL — AB (ref 65–99)
Glucose-Capillary: 457 mg/dL — ABNORMAL HIGH (ref 65–99)

## 2017-10-28 NOTE — Progress Notes (Signed)
Pt. BG 457  MD Vega notified, he said give pt. 10 units of insulin and recheck in 1 1/2 hour.

## 2017-10-28 NOTE — Progress Notes (Signed)
Reason for consult:   Subjective: patient eye pain significantly improved, no longer has impaired EOM.    ROS: negative except above, had epistaxis   Examination  Vital signs in last 24 hours: Temp:  [98 F (36.7 C)-98.8 F (37.1 C)] 98 F (36.7 C) (12/08 0525) Pulse Rate:  [83-97] 89 (12/08 0525) Resp:  [18-20] 20 (12/08 0525) BP: (150-174)/(65-72) 150/65 (12/08 0525) SpO2:  [95 %-99 %] 99 % (12/08 0525)  General: Not in distress, cooperative CVS: pulse-normal rate and rhythm RS: breathing comfortably Extremities: normal   Neuro: MS: Alert, oriented, follows commands CN: pupils equal and reactive,  EOMI, face symmetric, tongue midline, normal sensation over face, Motor: 5/5 strength in all 4 extremities Reflexes: 2+ bilaterally over patella, biceps, plantars: flexor Coordination: normal Gait: not tested  Basic Metabolic Panel: Recent Labs  Lab 10/26/17 1114 10/27/17 0314  NA 133* 135  K 4.3 3.8  CL 103 103  CO2 20* 24  GLUCOSE 393* 288*  BUN 12 10  CREATININE 0.75 0.75  CALCIUM 8.5* 8.3*    CBC: Recent Labs  Lab 10/26/17 1114 10/27/17 0314  WBC 7.6 7.1  NEUTROABS 5.2  --   HGB 14.4 13.1  HCT 41.9 38.8*  MCV 94.4 94.4  PLT 107* 106*     Coagulation Studies: Recent Labs    10/26/17 1812  LABPROT 14.6  INR 1.15    Imaging Reviewed:     ASSESSMENT AND PLAN  Right eye pain with impaired extraocular movements and papilledema  This is a confusing case, the patient presented with 2 weeks of right eye pain. 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 The patient has double vision however no problems with visual acuity on the right eye itself  He presents to the ER with worsening double vision and eye pain while awaiting an MRI brain ordered by the ophthalmologist. He was evaluated by my  colleague Dr. Otelia LimesLindzen who noticed the patient having impaired upward gaze in the right eye as well as medial gaze and thoughts and is more consistent with Tolosa Hunt syndrome. An MRI brain and MRI orbits with and without contrast was performed which showed no cavernous thrombosis, granulamotous lesion, atrophy or enhancement of optic nerves. Did show non spiecific white matter disease- possibly MS. On my exam the following morning, patient seemed to have more INO picture, with impaired adduction of right eye when looking to left but improved when left eye closed.    Course Received 1g solumedrol and symptoms have improved and almost back to baseline.  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    Georgiana SpinnerSushanth Aroor [facility] Pager Number [phone] For questions after 7pm please refer to AMION to reach the Neurologist on call

## 2017-10-28 NOTE — Significant Event (Signed)
Pt had nose bleeding in moderate amount. No headache, no eye pain noted. Ice compress and pressure applied to the nose. MD on call was informed. Will continue to monitor.

## 2017-10-28 NOTE — Progress Notes (Signed)
Pt. BG was 425  MD Vega Notified, He said give pt. 15 units of insulin  and recheck BG in 1 1/12 hours

## 2017-10-28 NOTE — Discharge Summary (Signed)
Physician Discharge Summary  Tyler Aguirre KGY:[id number] DOB: 24-Dec-1965 DOA: 10/26/2017  PCP: [place], Betty G, MD  Admit date: 10/26/2017 Discharge date: 10/28/2017  Time spent: > 35 minutes  Recommendations for Outpatient Follow-up:    Discharge Diagnoses:  Principal Problem:   Vision changes Active Problems:   Hyperlipidemia, unspecified   Essential hypertension   Chronic diastolic heart failure ([place])   Discharge Condition: stable  Diet recommendation: heart healthy carb modified diet.  Filed Weights   10/26/17 1103 10/26/17 1112 10/27/17 0028  Weight: 136.1 kg (300 lb) 136.1 kg (300 lb) 133.6 kg (294 lb 8.6 oz)    History of present illness:  CC: Double vision and right eye pain. 51 y.o.malewith medical history significant ofDM, HTN with recent flu 3 weeks ago comes in with almost 3 weeks of right eye pain and vision changes. Pt has seen ophthalmogy as outpt who noted papilledema in right eye and ordered an MRI.   Hospital Course:   Principal Problem:   Vision changes - Neurology on board while in house. Improved with steroids - question is whether this is an optic neuritis vs an autoimmune problem. - plan is for further work up as outpatient with MRI of spine.   Active Problems:   Hyperlipidemia, unspecified -  Stable, no chest pain reported.    Essential hypertension - Pt is currently on avapro blood pressures have fluctuated     Chronic diastolic heart failure ([place]) - stable and compensated currently   Procedures:  none  Consultations:  Neurology  Discharge Exam: Vitals:   10/28/17 1033 10/28/17 1456  BP: (!) 150/62 (!) 146/61  Pulse: 73 84  Resp: 19 19  Temp: 98.1 F (36.7 C) 97.6 F (36.4 C)  SpO2: 98% 95%    General: Pt in nad, alert and awake Cardiovascular: rrr, mrg Respiratory: CTA BL, no wheezes  Discharge Instructions   Discharge Instructions    Diet - low sodium heart healthy   Complete by:  As directed     Discharge instructions   Complete by:  As directed    Please follow-up with a neurologist and ophthalmologist on discharge   Increase activity slowly   Complete by:  As directed      Allergies as of 10/28/2017      Reactions   Ibuprofen Other (See Comments)   Has to go to the bathroom all the time   Metformin And Related Diarrhea   Unable to tolerate XL formulation.       Medication List    STOP taking these medications   GOODY HEADACHE PO     TAKE these medications   AURORA LANCET SUPER THIN 30G Misc by Does not apply route. As direceted   azelastine 0.1 % nasal spray Commonly known as:  ASTELIN Place 2 sprays into both nostrils 2 (two) times daily. Use in each nostril as directed   FREESTYLE LIBRE READER Devi 1 Device by Does not apply route 3 (three) times daily.   FREESTYLE LIBRE SENSOR SYSTEM Misc 1 Device by Does not apply route every 30 (thirty) days.   glucose blood test strip 1 each by Other route as needed. Use as instructed   Insulin Glargine 300 UNIT/ML Sopn Commonly known as:  TOUJEO SOLOSTAR Inject 65 Units into the skin at bedtime.   Insulin Pen Needle 32G X 4 MM Misc Commonly known as:  CAREFINE PEN NEEDLES Use 4x a day   irbesartan 150 MG tablet Commonly known as:  AVAPRO Take 1  tablet (150 mg total) by mouth daily.   NOVOLOG FLEXPEN 100 UNIT/ML FlexPen Generic drug:  insulin aspart INJECT 40-50 UNITS 3 TIMES DAILY   PROCTOSOL HC 2.5 % rectal cream Generic drug:  hydrocortisone APPLY RECTALLY TWICE DAILY      Allergies  Allergen Reactions  . Ibuprofen Other (See Comments)    Has to go to the bathroom all the time  . Metformin And Related Diarrhea    Unable to tolerate XL formulation.       The results of significant diagnostics from this hospitalization (including imaging, microbiology, ancillary and laboratory) are listed below for reference.    Significant Diagnostic Studies: Mr Jeri Cos And Wo Contrast  Result Date:  10/26/2017 CLINICAL DATA:  Initial evaluation for acute headaches, right eye pain and swelling. EXAM: MRI HEAD AND ORBITS WITHOUT AND WITH CONTRAST TECHNIQUE: Multiplanar, multiecho pulse sequences of the brain and surrounding structures were obtained without and with intravenous contrast. Multiplanar, multiecho pulse sequences of the orbits and surrounding structures were obtained including fat saturation techniques, before and after intravenous contrast administration. CONTRAST:  37m MULTIHANCE GADOBENATE DIMEGLUMINE 529 MG/ML IV SOLN COMPARISON:  Prior MRI from 04/21/2009. FINDINGS: MRI HEAD FINDINGS Brain: Cerebral volume within normal limits. Asymmetric confluent T2/FLAIR hyperintensity within the left periatrial white matter, similar to previous. Additional mild T2/FLAIR hyperintensity within the periventricular white matter both cerebral hemispheres, nonspecific, but mildly progressed relative to previous exam. One of these foci position within the right periatrial white matter is oriented perpendicular to the lateral ventricle (series 5, image 14). No abnormal foci of restricted diffusion to suggest acute or subacute ischemia. Gray-white matter differentiation maintained. No other evidence for chronic infarction. No foci of susceptibility artifact to suggest acute or chronic intracranial hemorrhage. No mass lesion, midline shift or mass effect. No hydrocephalus. No extra-axial fluid collection. Major dural sinuses are grossly patent. No findings to suggest cavernous sinus thrombosis. Pituitary gland suprasellar region within normal limits. Midline structures intact and normal. Vascular: Major intracranial vascular flow voids are maintained. Skull and upper cervical spine: Craniocervical junction normal. Upper cervical spine within normal limits. Bone marrow signal intensity normal. MRI ORBITS FINDINGS Orbits:  Study degraded by motion artifact. Globes are symmetric in size with normal morphology. Optic  nerves symmetric in size and normal in appearance. No appreciable optic nerve edema or abnormal enhancement. No abnormality seen at the orbital apices are about the cavernous sinus. Optic chiasm normally position within the suprasellar cistern. Extra-ocular muscles are normal in appearance and symmetric bilaterally. Lacrimal glands are normal. Superior orbital veins symmetric and within normal limits bilaterally. Intraconal and extraconal fat well-maintained. Visualized sinuses: Mild scattered mucoperiosteal thickening within the ethmoidal air cells. Visualized paranasal sinuses are otherwise clear. No air-fluid level to suggest acute sinusitis. Soft tissues: No appreciable periorbital soft tissue abnormality. IMPRESSION: 1. No acute intracranial abnormality. 2. Asymmetric confluent T2/FLAIR hyperintensity within the left periatrial white matter, similar to previous. Additional mild cerebral white matter changes are mildly progressed relative 2010. Findings are nonspecific, and may due to chronic small vessel ischemic changes. However, one of these foci positioned at the right periatrial white matter is oriented perpendicular to the lateral ventricle, which can be seen in the setting of underlying demyelinating disease. Clinical correlation recommended. No findings to suggest active demyelination on this exam. 3. Normal MRI of the orbits. No abnormality about the cavernous sinus. 4. Mild ethmoidal sinus disease, likely allergic/inflammatory nature. Electronically Signed   By: BJeannine BogaM.D.   On: 10/26/2017  22:21   Mr Rosealee Albee PQ Contrast  Result Date: 10/26/2017 CLINICAL DATA:  Initial evaluation for acute headaches, right eye pain and swelling. EXAM: MRI HEAD AND ORBITS WITHOUT AND WITH CONTRAST TECHNIQUE: Multiplanar, multiecho pulse sequences of the brain and surrounding structures were obtained without and with intravenous contrast. Multiplanar, multiecho pulse sequences of the orbits and  surrounding structures were obtained including fat saturation techniques, before and after intravenous contrast administration. CONTRAST:  47m MULTIHANCE GADOBENATE DIMEGLUMINE 529 MG/ML IV SOLN COMPARISON:  Prior MRI from 04/21/2009. FINDINGS: MRI HEAD FINDINGS Brain: Cerebral volume within normal limits. Asymmetric confluent T2/FLAIR hyperintensity within the left periatrial white matter, similar to previous. Additional mild T2/FLAIR hyperintensity within the periventricular white matter both cerebral hemispheres, nonspecific, but mildly progressed relative to previous exam. One of these foci position within the right periatrial white matter is oriented perpendicular to the lateral ventricle (series 5, image 14). No abnormal foci of restricted diffusion to suggest acute or subacute ischemia. Gray-white matter differentiation maintained. No other evidence for chronic infarction. No foci of susceptibility artifact to suggest acute or chronic intracranial hemorrhage. No mass lesion, midline shift or mass effect. No hydrocephalus. No extra-axial fluid collection. Major dural sinuses are grossly patent. No findings to suggest cavernous sinus thrombosis. Pituitary gland suprasellar region within normal limits. Midline structures intact and normal. Vascular: Major intracranial vascular flow voids are maintained. Skull and upper cervical spine: Craniocervical junction normal. Upper cervical spine within normal limits. Bone marrow signal intensity normal. MRI ORBITS FINDINGS Orbits:  Study degraded by motion artifact. Globes are symmetric in size with normal morphology. Optic nerves symmetric in size and normal in appearance. No appreciable optic nerve edema or abnormal enhancement. No abnormality seen at the orbital apices are about the cavernous sinus. Optic chiasm normally position within the suprasellar cistern. Extra-ocular muscles are normal in appearance and symmetric bilaterally. Lacrimal glands are normal.  Superior orbital veins symmetric and within normal limits bilaterally. Intraconal and extraconal fat well-maintained. Visualized sinuses: Mild scattered mucoperiosteal thickening within the ethmoidal air cells. Visualized paranasal sinuses are otherwise clear. No air-fluid level to suggest acute sinusitis. Soft tissues: No appreciable periorbital soft tissue abnormality. IMPRESSION: 1. No acute intracranial abnormality. 2. Asymmetric confluent T2/FLAIR hyperintensity within the left periatrial white matter, similar to previous. Additional mild cerebral white matter changes are mildly progressed relative 2010. Findings are nonspecific, and may due to chronic small vessel ischemic changes. However, one of these foci positioned at the right periatrial white matter is oriented perpendicular to the lateral ventricle, which can be seen in the setting of underlying demyelinating disease. Clinical correlation recommended. No findings to suggest active demyelination on this exam. 3. Normal MRI of the orbits. No abnormality about the cavernous sinus. 4. Mild ethmoidal sinus disease, likely allergic/inflammatory nature. Electronically Signed   By: BJeannine BogaM.D.   On: 10/26/2017 22:21    Microbiology: No results found for this or any previous visit (from the past 240 hour(s)).   Labs: Basic Metabolic Panel: Recent Labs  Lab 10/26/17 1114 10/27/17 0314  NA 133* 135  K 4.3 3.8  CL 103 103  CO2 20* 24  GLUCOSE 393* 288*  BUN 12 10  CREATININE 0.75 0.75  CALCIUM 8.5* 8.3*   Liver Function Tests: Recent Labs  Lab 10/26/17 1114  AST 68*  ALT 65*  ALKPHOS 107  BILITOT 1.7*  PROT 7.2  ALBUMIN 3.0*   No results for input(s): LIPASE, AMYLASE in the last 168 hours. No results for input(s):  AMMONIA in the last 168 hours. CBC: Recent Labs  Lab 10/26/17 1114 10/27/17 0314  WBC 7.6 7.1  NEUTROABS 5.2  --   HGB 14.4 13.1  HCT 41.9 38.8*  MCV 94.4 94.4  PLT 107* 106*   Cardiac  Enzymes: No results for input(s): CKTOTAL, CKMB, CKMBINDEX, TROPONINI in the last 168 hours. BNP: BNP (last 3 results) No results for input(s): BNP in the last 8760 hours.  ProBNP (last 3 results) No results for input(s): PROBNP in the last 8760 hours.  CBG: Recent Labs  Lab 10/28/17 0602 10/28/17 1215 10/28/17 1401 10/28/17 1550 10/28/17 1628  GLUCAP 299* 425* 457* 305* 222*    Signed:  Velvet Bathe MD.  [facility] 10/28/2017, 4:51 PM

## 2017-10-28 NOTE — Progress Notes (Signed)
Nurse went over discharge with Pt.   Pt. Verbalized understanding of Discharge. All questions and concerns  Addressed.   Discharge home, all belongings are with pt and pt. left ambulatory

## 2017-11-02 ENCOUNTER — Other Ambulatory Visit: Payer: Self-pay

## 2017-11-02 MED ORDER — "PEN NEEDLES 5/16"" 31G X 8 MM MISC": 3 refills | Status: AC

## 2017-11-03 ENCOUNTER — Other Ambulatory Visit: Payer: BLUE CROSS/BLUE SHIELD

## 2017-11-07 ENCOUNTER — Ambulatory Visit: Payer: BLUE CROSS/BLUE SHIELD | Admitting: Internal Medicine

## 2017-11-07 DIAGNOSIS — J342 Deviated nasal septum: Secondary | ICD-10-CM | POA: Diagnosis not present

## 2017-11-07 DIAGNOSIS — J310 Chronic rhinitis: Secondary | ICD-10-CM | POA: Diagnosis not present

## 2017-11-07 DIAGNOSIS — J343 Hypertrophy of nasal turbinates: Secondary | ICD-10-CM | POA: Diagnosis not present

## 2017-11-07 DIAGNOSIS — R51 Headache: Secondary | ICD-10-CM | POA: Diagnosis not present

## 2017-11-28 DIAGNOSIS — J019 Acute sinusitis, unspecified: Secondary | ICD-10-CM | POA: Diagnosis not present

## 2017-12-05 ENCOUNTER — Encounter: Payer: Self-pay | Admitting: Internal Medicine

## 2017-12-05 ENCOUNTER — Ambulatory Visit (INDEPENDENT_AMBULATORY_CARE_PROVIDER_SITE_OTHER): Payer: BLUE CROSS/BLUE SHIELD | Admitting: Internal Medicine

## 2017-12-05 VITALS — BP 158/80 | HR 94 | Ht 72.0 in | Wt 316.0 lb

## 2017-12-05 DIAGNOSIS — Z794 Long term (current) use of insulin: Secondary | ICD-10-CM

## 2017-12-05 DIAGNOSIS — E11319 Type 2 diabetes mellitus with unspecified diabetic retinopathy without macular edema: Secondary | ICD-10-CM | POA: Diagnosis not present

## 2017-12-05 LAB — POCT GLYCOSYLATED HEMOGLOBIN (HGB A1C): Hemoglobin A1C: 8

## 2017-12-05 MED ORDER — FREESTYLE LIBRE 14 DAY SENSOR MISC: 1.0000 | 11 refills | Status: DC

## 2017-12-05 MED ORDER — FREESTYLE LIBRE 14 DAY READER DEVI: 1.0000 | Freq: Once | 1 refills | Status: AC

## 2017-12-05 NOTE — Progress Notes (Signed)
Patient ID: Tyler Aguirre, male   DOB: Jul 14, 1966, 52 y.o.   MRN: [id number]   HPI: Tyler Aguirre is a 52 y.o.-year-old male, returning for follow-up for DM2, dx in the 1990s, insulin-dependent, uncontrolled, with complications (DR). Last visit 4 mo ago.  He had the flu 09/2017 >> developed eye pain and double vision >> presumed sinus inf. >> was on ABx >> not helping. He then went to [facility] >> sent to see ophthalmology >> no eye pbs. He then went to the ED >> CT scan >> no pathology >> started high dose steroids and ABx >> pain resolved. Now off steroids for 4 weeks.  Still unclear diagnosis.  He will see neurology soon.  Last hemoglobin A1c was: Lab Results  Component Value Date   HGBA1C 8.2 08/08/2017   HGBA1C 8.1 02/28/2017   HGBA1C 7.0 11/28/2016   He is on: -  >> stopped 09/2017 b/c AP + hungry in a.m. (!) - Humalog 10-15 min before a meal. - 40 units before a smaller meal - 50 units before a regular meal  - 60 units before a larger meal He could not tolerate Trulicity 2.11 mg weekly  >> nausea. He had to stop Invokana in the past because of constipation. He stopped Metformin 2/2 GI intolerance.  Pt checks his sugars frequently with the Freestyle Libre CGM : ave 272 +/- 102.3, Coef. Of var.: 37.6% Time in range:  - low (<70): 2% - normal range (70-180): 19% - high sugars (>180): 79%  25-75% range: - am:  116, 129-197 >> 120-200s (later meals) >> 180-190s >> 150-255 - 2h after b'fast: n/c >> 200-260 - before lunch: n/c >> 242 >> 130-160 >> n/c >> 250-300 - 2h after lunch: 154-289, 359 >> 350 >> n/c >> >350 - before dinner: 97-203 >> 204-297 >> 200s >> 180-200s >> 300->350 - 2h after dinner: 181, 323 >> 327, 333 >> n/c >> 190-330 - bedtime: n/c >> 150-210 - nighttime: n/c >> 60 at 12 am >> 100-220  Lowest sugar was 39 (woke him up) x1 - very active, small dinner >> ... >> 49 at 4 am ; he has hypoglycemia awareness at 80s. 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  - NoCKD, last BUN/creatinine:  Lab Results  Component Value Date   BUN 10 10/27/2017   BUN 12 10/26/2017   CREATININE 0.75 10/27/2017   CREATININE 0.75 10/26/2017  On Avapro.  He has a h/o high ACR >> resolved: Lab Results  Component Value Date   MICRALBCREAT 1.9 08/22/2016   MICRALBCREAT 6.7 01/22/2010   MICRALBCREAT 4.1 09/02/2009   MICRALBCREAT 6.9 02/05/2009   MICRALBCREAT 13.9 11/30/2007   - + HL; last set of lipids: Lab Results  Component Value Date   CHOL 214 (H) 08/22/2016   HDL 46.00 08/22/2016   LDLCALC 150 (H) 08/22/2016   LDLDIRECT 147.8 01/22/2010   TRIG 92.0 08/22/2016   CHOLHDL 5 08/22/2016  On Zocor. - last eye exam was in 05/2017 >> + DR - no numbness and tingling in his feet.  He had an episode of presyncope when exercising in the park in 2018. A recent EKG, exercise stress test, 2D Echo (Dr. Sallyanne Kuster) >> normal.  ROS:  Constitutional: no weight gain/no weight loss, no fatigue, no subjective hyperthermia, no subjective hypothermia Eyes: no blurry vision, no xerophthalmia ENT: + sore throat, no nodules palpated in throat, no dysphagia, no odynophagia, no hoarseness Cardiovascular: no CP/no SOB/no palpitations/no leg swelling Respiratory: no cough/no SOB/+ wheezing Gastrointestinal: no N/no V/no D/no C/no acid reflux Musculoskeletal: no muscle aches/no joint aches Skin: no rashes, no hair loss Neurological: no tremors/no numbness/no tingling/no dizziness, + HA  I reviewed pt's medications, allergies, PMH, social hx, family hx, and changes were documented in the history of present illness. Otherwise, unchanged from my initial visit note.  Past Medical History:  Diagnosis Date  . ALLERGIC RHINITIS 03/12/2009  . DIABETES MELLITUS, TYPE II  02/26/2007  . [place] DISEASE, LUMBAR 06/16/2009  . Flu 10/2017  . HYPERLIPIDEMIA 02/26/2007  . HYPERTENSION 02/26/2007  . NUMBNESS 04/17/2009  . Overweight(278.02) 02/26/2007  . Papilledema 10/2017  . PROSTATITIS, ACUTE 04/17/2009  . TRANSVERSE MYELITIS 05/01/2009  . Vision changes 10/2017   Past Surgical History:  Procedure Laterality Date  . COLONOSCOPY    . DENTAL SURGERY    . POLYPECTOMY     Social History   Social History  . Marital status: Single    Spouse name: N/A  . 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 Smoking status: Never Smoker  . Smokeless tobacco: Never Used  . Alcohol use Yes  . Drug use: No   Current Outpatient Medications on File Prior to Visit  Medication Sig Dispense Refill  . Aurora Lancet Super Thin 30G MISC by Does not apply route. As direceted     . azelastine (ASTELIN) 0.1 % nasal spray Place 2 sprays into both nostrils 2 (two) times daily. Use in each nostril as directed 30 mL 6  . Continuous Blood Gluc Receiver (FREESTYLE LIBRE READER) DEVI 1 Device by Does not apply route 3 (three) times daily. 1 Device 1  . Continuous Blood Gluc Sensor (FREESTYLE LIBRE SENSOR SYSTEM) MISC 1 Device by Does not apply route every 30 (thirty) days. 3 each 11  . glucose blood test strip 1 each by Other route as needed. Use as instructed 100 each 3  . Insulin Glargine (TOUJEO SOLOSTAR) 300 UNIT/ML SOPN Inject 65 Units into the skin at bedtime. 9 pen 5  . Insulin Pen Needle (PEN NEEDLES 31GX5/16") 31G X 8 MM MISC Use 4x a day 100 each 3  . irbesartan (AVAPRO) 150 MG tablet Take 1 tablet (150 mg total) by mouth daily. 30 tablet 11  . NOVOLOG FLEXPEN 100 UNIT/ML FlexPen INJECT 40-50 UNITS 3 TIMES DAILY 45 pen 1  . PROCTOSOL HC 2.5 % rectal cream APPLY RECTALLY TWICE DAILY 28.35 g 0   No current facility-administered medications on file prior to visit.    Allergies  Allergen Reactions  . Ibuprofen Other (See  Comments)    Has to go to the bathroom all the time  . Metformin And Related Diarrhea    Unable to tolerate XL formulation.    Family History  Problem Relation Age of Onset  . Heart disease Mother        valve dz  . Arthritis Mother        RA  . Anemia Mother   . Hypertension Father   . Heart disease Father   . Heart disease Maternal Uncle        CAD  . Diabetes Paternal  Grandmother   . Obesity Paternal Grandmother   . Hypertension Paternal Grandmother   . Cancer Paternal Grandfather        lung  . Mental retardation Paternal Grandfather        suicided  . Hypertension Paternal Grandfather   . Diabetes Paternal Grandfather   . Obesity Maternal Grandfather   . Hypertension Maternal Grandfather   . Stroke Paternal Uncle   . Cancer Paternal Uncle    PE: BP (!) 158/80   Pulse 94   Ht 6' (1.829 m)   Wt (!) 316 lb (143.3 kg)   SpO2 95%   BMI 42.86 kg/m  Wt Readings from Last 3 Encounters:  12/05/17 (!) 316 lb (143.3 kg)  10/27/17 294 lb 8.6 oz (133.6 kg)  09/15/17 (!) 307 lb (139.3 kg)   Constitutional: obese, in NAD Eyes: PERRLA, EOMI, no exophthalmos ENT: moist mucous membranes, no thyromegaly, no cervical lymphadenopathy Cardiovascular: tachycardia, RR, No MRG Respiratory: CTA B Gastrointestinal: abdomen soft, NT, ND, BS+ Musculoskeletal: no deformities, strength intact in all 4 Skin: moist, warm, no rashes Neurological: no tremor with outstretched hands, DTR normal in all 4  ASSESSMENT: 1. DM2, insulin-dependent, uncontrolled, with complications - DR  2. Obesity class 3  PLAN:  1. Patient with long-standing, uncontrolled, diabetes, previously on basal-bolus insulin regimen, however, off his basal insulin for almost 2 months as he felt that this was dropping his sugars too much (no associated lows, though) and causing him abdominal pain.   - He had medical problems since last visit (see HPI) and was on high-dose steroids.  However, he stopped them almost 4  weeks ago and his sugars remain very high, with a pattern of increasing from 3 AM to 3 PM and then starting to decrease.  He had one low in the early morning possibly due to higher dose of Humalog with dinner.  I advised him to stay day with a lower doses of Humalog (40-50 units) right after he restarts Toujeo. - We discussed that we absolutely need to restart basal insulin and he is willing to retry Toujeo at a lower dose.  He will let me know how he tolerates it and if he cannot tolerate it well we can change to another basal insulin.  I would prefer to stay on the more concentrated one as he is using a fairly high dose.  I did advise him to start this in the morning since his sugars are much higher as the day goes by, with the highest sugars around 3 PM. - For now we can continue with the current doses of Humalog - He will probably require U500 insulin soon - I suggested to:  Patient Instructions  Please restart: - Toujeo 50 units in am and increase to 65 units if tolerated  Please continue: - Humalog 10-15 min before a meal. - 40 units before a smaller meal - 50 units before a regular meal  - 60 units before a larger meal  Please return in 3 months with your sugar log.   - today, HbA1c is 8% (slightly lower) - continue checking sugars at different times of the day - check 3x a day, rotating checks - advised for yearly eye exams >> he is UTD - Return to clinic in 3 mo with sugar log    2. Obesity class 3 - Gained more than 20 pounds since last visit (!) - Unfortunately, adding more insulin will not help his weight gain, but we have to do  this since his sugars are so high -Other medicines that could help with weight loss: SGLT2 inhibitors and GLP-1 receptor agonists cause side effects in the past: Constipation and nausea, respectively.  She is open to restart these if really needed in the future.  For now, though, we absolutely need to add basal insulin and at next visit hopefully we can  start reducing the doses of his Humalog and transition towards 1 of the above medicines. - He absolutely needs a change in diet  Philemon Kingdom, MD PhD [facility] Endocrinology

## 2017-12-05 NOTE — Patient Instructions (Addendum)
Please restart: - Toujeo 50 units in am and increase to 65 units if tolerated  Please continue: - Humalog 10-15 min before a meal. - 40 units before a smaller meal - 50 units before a regular meal  - 60 units before a larger meal  Please return in 3 months with your sugar log.

## 2017-12-08 DIAGNOSIS — J342 Deviated nasal septum: Secondary | ICD-10-CM | POA: Diagnosis not present

## 2017-12-08 DIAGNOSIS — J343 Hypertrophy of nasal turbinates: Secondary | ICD-10-CM | POA: Diagnosis not present

## 2017-12-08 DIAGNOSIS — J3489 Other specified disorders of nose and nasal sinuses: Secondary | ICD-10-CM | POA: Diagnosis not present

## 2017-12-28 ENCOUNTER — Ambulatory Visit (INDEPENDENT_AMBULATORY_CARE_PROVIDER_SITE_OTHER): Payer: BLUE CROSS/BLUE SHIELD | Admitting: Neurology

## 2017-12-28 ENCOUNTER — Encounter: Payer: Self-pay | Admitting: Neurology

## 2017-12-28 VITALS — BP 149/81 | HR 89 | Ht 72.0 in | Wt 311.0 lb

## 2017-12-28 DIAGNOSIS — H4710 Unspecified papilledema: Secondary | ICD-10-CM | POA: Insufficient documentation

## 2017-12-28 DIAGNOSIS — G373 Acute transverse myelitis in demyelinating disease of central nervous system: Secondary | ICD-10-CM

## 2017-12-28 DIAGNOSIS — R9082 White matter disease, unspecified: Secondary | ICD-10-CM | POA: Diagnosis not present

## 2017-12-28 NOTE — Patient Instructions (Signed)
Lumbar Puncture, Care After °Refer to this sheet in the next few weeks. These instructions provide you with information on caring for yourself after your procedure. Your health care provider may also give you more specific instructions. Your treatment has been planned according to current medical practices, but problems sometimes occur. Call your health care provider if you have any problems or questions after your procedure. °What can I expect after the procedure? °After your procedure, it is typical to have the following sensations: °· Mild discomfort or pain at the insertion site. °· Mild headache that is relieved with pain medicines. ° °Follow these instructions at home: ° °· Avoid lifting anything heavier than 10 lb (4.5 kg) for at least 12 hours after the procedure. °· Drink enough fluids to keep your urine clear or pale yellow. °Contact a health care provider if: °· You have fever or chills. °· You have nausea or vomiting. °· You have a headache that lasts for more than 2 days. °Get help right away if: °· You have any numbness or tingling in your legs. °· You are unable to control your bowel or bladder. °· You have bleeding or swelling in your back at the insertion site. °· You are dizzy or faint. °This information is not intended to replace advice given to you by your health care provider. Make sure you discuss any questions you have with your health care provider. °Document Released: 11/12/2013 Document Revised: 04/14/2016 Document Reviewed: 07/16/2013 °Elsevier Interactive Patient Education © 2017 [employer] ° °

## 2017-12-28 NOTE — Progress Notes (Signed)
[facility]   Provider:  [name], M D  Primary Care Physician:  Martinique, Betty G, MD   Referring Provider:  Ophthalmologist  Dr Jalene Mullet.    Chief Complaint  Patient presents with  . New Patient (Initial Visit)    pt alone rm 11 pt was in the hospital in december and he had migraines and blurry vision. double vision in the hospital..pt took a round of antibiotics and steroids and since then he states headaches went away  and vision difficulties went away. pt has difficulty with sleeping throught the night. feels tired during the day. pt snores when he sleeps. sleep study was 20 years ago.     HPI:  Tyler Aguirre is a 52 y.o. male , seen here as in a referral from [name] , MD  Berline Lopes provided an interesting history he had a severe form of right  pain right above the eyebrow , medially to the orbit.  This pain was so severe that he went first to a primary care evaluation and it was presumed that he has a flulike infection at the time.  He was discharged and just told to stay home hydrate well and give 3 days to see if the symptoms persist.  He developed more and more sinus pressure and return this time to a different urgent care in background a presumed diagnosis of sinusitis was made and he was placed on antibiotics.  He took these antibiotics for 5 days without relief.    The symptoms persisted and he [facility], date is 23 October 2017, from there he was sent to the emergency room at [facility].  I quote from the encounter notes that the patient has a diagnosis of : "allergic rhinitis, diabetes mellitus type 2, degenerative disc disease, obesity, hyperlipidemia hypertension, prostatitis and a remote history of transverse myelitis.  In December 2017 he also was diagnosed with chronic diastolic heart failure, grade 2 morbid obesity with comorbidity, type 2 diabetes was now associated with diabetic retinopathy at the time however  without macular edema.  He is now followed for endocrinology by Dr. Benjiman Core." . From urgent care at [facility] [place] he was sent to Dr. Posey Pronto ophthalmology, they found that Tyler Aguirre had papilledema, mentioned that he is currently treated with amoxicillin, visual acuity was 20/25 on the right and 20/25 on the left, the intraocular pressures were 17 mm on the right 50 mm on the left, but there was no afferent pupillary defect noted, slit lamp was unremarkable, fundus revealed swelling of the optic nerves bilaterally.  Worse on the right over left.  Disc margins were blurred in both eyes.  The ophthalmologist done and return ordered an MRI of the brain and the referral to neurology.  The patient returned to the emergency room the same night. After being sent to the emergency room he was evaluated for vision changes, he was given steroids which improved both vision and headaches, he was still continued on high-dose antibiotics.  And after 2 days had a significant relief of the symptoms.  He was set up with neurology for outpatient follow up. He is finally here today.  He kept getting sick with sinusitis, and his ENT MD finally determined he needed to have a deviated septum repair , December 10 2017.   Chief complaint according to patient : resolved eye pain. Abnormal MRI brain.   Family history ; Tyler Aguirre has a strong family  history and family disposition for diabetes mellitus.  He was diagnosed at a young age 89, initially treated with oral antidiabetics, and over the last 3 years has used insulin. Obesity. 10 years ago the patient was diagnosed with transverse myelitis and lost sensation and control of the body below the navel.  Th12 level.  He recovered gradually he underwent a lumbar puncture, was treated with antibiotics, and hospitalized at [facility] at the time.    Paternal grandfather had DM2. Father had heart valvular disease, biological valve relacement. , maternal uncles had  CAD. MI.  Mother and maternal grandmother had osteoarthrtis, healthy brother.    Social history:  No tobacco use, no ETOH use, caffeine : once a day soda, not coffee. Works first shift, dusty environment, Producer, television/film/video. Not married.    Review of Systems: Out of a complete 14 system review, the patient complains of only the following symptoms, and all other reviewed systems are negative. Tyler Aguirre currently has no longer eye pain after being treated with antibiotics and steroids, he also has not noticed a lasting impairment in visual acuity, color vision, light perception, or perception of distance.  He can read without difficulty.  Social History   Socioeconomic History  . Marital status: Single    Spouse name: Not on file  . Number of children: Not on file  . Years of education: Not on file  . Highest education level: Not on file  Social Needs  . Financial resource strain: Not on file  . Food insecurity - worry: Not on file  . Food insecurity - inability: Not on file  . Transportation needs - medical: Not on file  . Transportation needs - non-medical: Not on file  Occupational History  . Not on file  Tobacco Use  . Smoking status: Never Smoker  . Smokeless tobacco: Never Used  Substance and Sexual Activity  . Alcohol use: Yes  . Drug use: No  . Sexual activity: Yes    Birth control/protection: Condom  Other Topics Concern  . Not on file  Social History Narrative  . Not on file    Family History  Problem Relation Age of Onset  . Heart disease Mother        valve dz  . Arthritis Mother        RA  . Anemia Mother   . Hypertension Father   . Heart disease Father   . Heart disease Maternal Uncle        CAD  . Diabetes Paternal Grandmother   . Obesity Paternal Grandmother   . Hypertension Paternal Grandmother   . Cancer Paternal Grandfather        lung  . Mental retardation Paternal Grandfather        suicided  . Hypertension Paternal Grandfather   . Diabetes  Paternal Grandfather   . Obesity Maternal Grandfather   . Hypertension Maternal Grandfather   . Stroke Paternal Uncle   . Cancer Paternal Uncle     Past Medical History:  Diagnosis Date  . ALLERGIC RHINITIS 03/12/2009  . DIABETES MELLITUS, TYPE II 02/26/2007  . [place] DISEASE, LUMBAR 06/16/2009  . Flu 10/2017  . HYPERLIPIDEMIA 02/26/2007  . HYPERTENSION 02/26/2007  . NUMBNESS 04/17/2009  . Overweight(278.02) 02/26/2007  . Papilledema 10/2017  . PROSTATITIS, ACUTE 04/17/2009  . TRANSVERSE MYELITIS 05/01/2009  . Vision changes 10/2017    Past Surgical History:  Procedure Laterality Date  . COLONOSCOPY    . DENTAL SURGERY    .  POLYPECTOMY      Current Outpatient Medications  Medication Sig Dispense Refill  . Aurora Lancet Super Thin 30G MISC by Does not apply route. As direceted     . azelastine (ASTELIN) 0.1 % nasal spray Place 2 sprays into both nostrils 2 (two) times daily. Use in each nostril as directed 30 mL 6  . Continuous Blood Gluc Sensor (FREESTYLE LIBRE 14 DAY SENSOR) MISC 1 each by Does not apply route every 14 (fourteen) days. Change every 2 weeks 2 each 11  . glucose blood test strip 1 each by Other route as needed. Use as instructed 100 each 3  . Insulin Pen Needle (PEN NEEDLES 31GX5/16") 31G X 8 MM MISC Use 4x a day 100 each 3  . irbesartan (AVAPRO) 150 MG tablet Take 1 tablet (150 mg total) by mouth daily. 30 tablet 11  . NOVOLOG FLEXPEN 100 UNIT/ML FlexPen INJECT 40-50 UNITS 3 TIMES DAILY 45 pen 1  . PROCTOSOL HC 2.5 % rectal cream APPLY RECTALLY TWICE DAILY 28.35 g 0   No current facility-administered medications for this visit.     Allergies as of 12/28/2017 - Review Complete 12/28/2017  Allergen Reaction Noted  . Ibuprofen Other (See Comments) 08/13/2014  . Metformin and related Diarrhea 11/09/2015    Vitals: BP (!) 149/81   Pulse 89   Ht 6' (1.829 m)   Wt (!) 311 lb (141.1 kg)   BMI 42.18 kg/m  Last Weight:  Wt Readings from Last 1 Encounters:  12/28/17  (!) 311 lb (141.1 kg)   DJS:HFWY mass index is 42.18 kg/m.     Last Height:   Ht Readings from Last 1 Encounters:  12/28/17 6' (1.829 m)    Physical exam:  General: The patient is awake, alert and appears not in acute distress. The patient is well groomed. Head: Normocephalic, atraumatic.  Large neck, overweight. Facial hair.  Cardiovascular:  Regular rate and rhythm , without  murmurs or carotid bruit, and without distended neck veins. Respiratory: Lungs are clear to auscultation.  The patient is status post nasal septoplasty just 14 days ago, has recovered very well and for the most part has a air patent nasal passage. Skin:  Without evidence of edema, or rash Trunk: BMI is 42. 1. The patient's posture is erect  Neurologic exam :The patient is awake and alert, oriented to place and time.   Attention span & concentration ability appears normal.  Speech is fluent,  without dysarthria, dysphonia or aphasia.  Mood and affect are appropriate.  Cranial nerves:Pupils are equal and briskly reactive to light. Funduscopic exam with  evidence of mild  Edema OD, none in OS. Extraocular movements  in vertical and horizontal planes intact and without nystagmus. Visual fields by finger perimetry are intact. Hearing to finger rub intact.  Facial sensation intact to fine touch. Facial motor strength is symmetric and tongue and uvula move midline. Shoulder shrug was symmetrical.  Motor exam:   Normal tone, muscle bulk and symmetric strength in all extremities. Sensory:  Fine touch, pinprick and vibration were intact . Proprioception tested in the upper extremities was normal. Coordination: Rapid alternating movements in the fingers/hands was normal. Finger-to-nose maneuver  normal without evidence of ataxia, dysmetria or tremor. Gait and station: Patient walks without assistive device  .Tandem gait is unfragmented. Turns with 3  Steps.  Deep tendon reflexes: in the  upper and lower extremities are symmetric  and intact.   Assessment:  After physical and neurologic examination, review of  laboratory studies,  Personal review of imaging studies, reports of other /same  Imaging studies, results of polysomnography and / or neurophysiology testing and pre-existing records as far as provided in visit., my assessment is.  Reviewed 2 hospital records and treatment protocol.  Urgent care notes, and I thank Dr. Posey Pronto for the ophthalmology notes.  This was really just a cover letter. CLINICAL DATA:  Initial evaluation for acute headaches, right eye pain and swelling.  EXAM: MRI HEAD AND ORBITS WITHOUT AND WITH CONTRAST  TECHNIQUE: Multiplanar, multiecho pulse sequences of the brain and surrounding structures were obtained without and with intravenous contrast. Multiplanar, multiecho pulse sequences of the orbits and surrounding structures were obtained including fat saturation techniques, before and after intravenous contrast administration.  CONTRAST:  85m MULTIHANCE GADOBENATE DIMEGLUMINE 529 MG/ML IV SOLN  COMPARISON:  Prior MRI from 04/21/2009.  FINDINGS: MRI HEAD FINDINGS  Brain: Cerebral volume within normal limits. Asymmetric confluent T2/FLAIR hyperintensity within the left periatrial white matter, similar to previous. Additional mild T2/FLAIR hyperintensity within the periventricular white matter both cerebral hemispheres, nonspecific, but mildly progressed relative to previous exam. One of these foci position within the right periatrial white matter is oriented perpendicular to the lateral ventricle (series 5, image 14).  No abnormal foci of restricted diffusion to suggest acute or subacute ischemia. Gray-white matter differentiation maintained. No other evidence for chronic infarction. No foci of susceptibility artifact to suggest acute or chronic intracranial hemorrhage.  No mass lesion, midline shift or mass effect. No hydrocephalus. No extra-axial fluid collection.  Major dural sinuses are grossly patent. No findings to suggest cavernous sinus thrombosis.  Pituitary gland suprasellar region within normal limits. Midline structures intact and normal.  Vascular: Major intracranial vascular flow voids are maintained.  Skull and upper cervical spine: Craniocervical junction normal. Upper cervical spine within normal limits. Bone marrow signal intensity normal.  MRI ORBITS FINDINGS  Orbits:  Study degraded by motion artifact.  Globes are symmetric in size with normal morphology. Optic nerves symmetric in size and normal in appearance. No appreciable optic nerve edema or abnormal enhancement. No abnormality seen at the orbital apices are about the cavernous sinus. Optic chiasm normally position within the suprasellar cistern. Extra-ocular muscles are normal in appearance and symmetric bilaterally. Lacrimal glands are normal. Superior orbital veins symmetric and within normal limits bilaterally. Intraconal and extraconal fat well-maintained.  Visualized sinuses: Mild scattered mucoperiosteal thickening within the ethmoidal air cells. Visualized paranasal sinuses are otherwise clear. No air-fluid level to suggest acute sinusitis.  Soft tissues: No appreciable periorbital soft tissue abnormality.  IMPRESSION: 1. No acute intracranial abnormality. 2. Asymmetric confluent T2/FLAIR hyperintensity within the left periatrial white matter, similar to previous. Additional mild cerebral white matter changes are mildly progressed relative 2010. Findings are nonspecific, and may due to chronic small vessel ischemic changes. However, one of these foci positioned at the right periatrial white matter is oriented perpendicular to the lateral ventricle, which can be seen in the setting of underlying demyelinating disease. Clinical correlation recommended. No findings to suggest active demyelination on this exam. 3. Normal MRI of the orbits. No  abnormality about the cavernous sinus. 4. Mild ethmoidal sinus disease, likely allergic/inflammatory nature.   Electronically Signed   By: BJeannine BogaM.D.   On: 10/26/2017 22:21    Review of the brain MRI in the presence of the patient and looked at the images, there is some mild hyperintensities periventricular these are nonspecific and would not constitute a sign of a demyelinating disease.  The left lateral ventricle appears enlarged in comparison to the right and there is periatrial white matter restriction, a possible scar formation allowing fluid to occupy the room ex vacuo.  Additionally a severely deviated nasal septum is noted.  The orbits appear normal.  There is no evidence of any pressure on the right eye. [name], MD   Vitals   Height Weight BMI (Calculated)  6' (1.829 m) 294 lb 8.6 oz (133.6 kg) 39     1) Tyler Aguirre brain MRI shows an older lesion, that has shrunken the left parietal lobe tissue, and allowed the left lateral ventricle to enlarge ex vacuo.  There is also a very small frontal deep white matter lesion that is perpendicular to the ventricle.  Consider that he had a transverse myelitis 10 years ago I am not sure that these did not already were present at the time and related to a acute demyelinating encephalopathy at the time.  The radiologist seem to have found correlation to the older MRI and spoke of only mild progression of small vessel disease.  The patient now however presented with bilateral papilledema right over left and there was no orbital structure noted that would impose pressure on the back of the eye.  He has undergone sinus surgery steroid and antibiotic treatment and has recovered well.  However papilledema is rarely associated with a severe case of sinusitis, and for this reason we should rule out a demyelinating underlying disorder. I will order the following tests, a spinal tap with opening pressure, labs for oligoclonal  bands, cells and differential, protein and glucose, and to run a culture please. If enough fluid is procured I would also like a Lyme test on the CSF.  The patient now has no focal abnormalities his neurologic exam is normal his vision has recovered.  I would not treat him with an immune suppressant given that he is on a diabetic and be that the last manifestation of a possible CNS disorder was over 10 years ago.  That showed oligoclonal bands be found in his cerebrospinal fluid we should consider a yearly follow-up but there is also argument for treating early and what would be perceived as an indication of an autoimmune disorder.  The patient was advised of the nature of the diagnosed disorder , the treatment options and the  risks for general health and wellness arising from not treating the condition.   I spent more than 45 minutes of face to face time with the patient.  Greater than 50% of time was spent in counseling and coordination of care. We have discussed the diagnosis and differential and I answered the patient's questions.    Plan:  Treatment plan and additional workup :  LB at [facility] - he had recent MRI, ready to go. Opening pressure, oligoclonal bands, cells and diff, Glucose and Proteine. If possible LYME.   [name], MD 12/29/4130, 4:40 PM  Certified in Neurology by ABPN Certified in [place] by [facility] [address] [place], [place] [address]

## 2018-01-08 ENCOUNTER — Telehealth: Payer: Self-pay | Admitting: Neurology

## 2018-01-08 NOTE — Telephone Encounter (Signed)
Enrique SackKendra with [facility] is calling regarding a request she received for labs.

## 2018-01-09 NOTE — Telephone Encounter (Signed)
Called the [facility] and they were questioning in the LP test Dr Dohmeier wanted Lyme tested but there are two test and they are wanting to know which one she wanted or if she wanted both. Dr Vickey Hugerohmeier has states that she wants the titer but would like the option of both. I have relayed this to [place] [facility].

## 2018-01-12 ENCOUNTER — Ambulatory Visit
Admission: RE | Admit: 2018-01-12 | Discharge: 2018-01-12 | Disposition: A | Discharge status: Home / Self Care | Payer: BLUE CROSS/BLUE SHIELD | Source: Ambulatory Visit | Attending: Neurology | Admitting: Neurology

## 2018-01-12 VITALS — BP 221/84 | HR 90

## 2018-01-12 DIAGNOSIS — R9082 White matter disease, unspecified: Secondary | ICD-10-CM

## 2018-01-12 DIAGNOSIS — G373 Acute transverse myelitis in demyelinating disease of central nervous system: Secondary | ICD-10-CM | POA: Diagnosis not present

## 2018-01-12 DIAGNOSIS — H4710 Unspecified papilledema: Secondary | ICD-10-CM | POA: Diagnosis not present

## 2018-01-12 NOTE — Discharge Instructions (Signed)

## 2018-01-15 ENCOUNTER — Telehealth: Payer: Self-pay | Admitting: Radiology

## 2018-01-15 NOTE — Telephone Encounter (Signed)
I saw Mr. Pricilla Holmucker in person on 01/15/2018 at 1530 hrs at [facility] ([address]) when he stopped by to discuss symptoms he has been experiencing recently. He reports head pressure when bending over that resolves upon resuming an upright position. He underwent a lumbar puncture here 3 days ago, however he denies headaches. We discussed that his symptoms did not fit the typical post-lumbar puncture headache pattern.  He had sinus surgery approximately 6 weeks ago and has noticed some postnasal drip recently and believes the head pressure may instead be sinus-related. I advised him that treatment for a low-pressure post-LP headache would be additional bedrest and fluids potentially followed by blood patch, and he was instructed to call back if his symptoms worsened, particularly if he were to develop a positional headache. He was also advised to contact his ENT if his sinus symptoms persist or worsen.

## 2018-01-16 DIAGNOSIS — E113293 Type 2 diabetes mellitus with mild nonproliferative diabetic retinopathy without macular edema, bilateral: Secondary | ICD-10-CM | POA: Diagnosis not present

## 2018-01-16 LAB — CSF CULTURE W GRAM STAIN: Result:: NO GROWTH

## 2018-01-16 LAB — CSF CULTURE
MICRO NUMBER: 90235956
SPECIMEN QUALITY: ADEQUATE

## 2018-01-16 LAB — HM DIABETES EYE EXAM

## 2018-01-17 ENCOUNTER — Telehealth: Payer: Self-pay | Admitting: Neurology

## 2018-01-17 ENCOUNTER — Other Ambulatory Visit: Payer: Self-pay | Admitting: Neurology

## 2018-01-17 LAB — BORRELIA SPECIES DNA, FLUID, PCR: B. burgdorferi DNA: NOT DETECTED

## 2018-01-17 LAB — CSF CELL COUNT WITH DIFFERENTIAL
RBC Count, CSF: 8 cells/uL (ref 0–10)
WBC, CSF: 0 cells/uL (ref 0–5)

## 2018-01-17 LAB — GLUCOSE, CSF: GLUCOSE CSF: 114 mg/dL — AB (ref 40–80)

## 2018-01-17 LAB — PROTEIN, CSF: TOTAL PROTEIN, CSF: 44 mg/dL (ref 15–45)

## 2018-01-17 LAB — LYME DISEASE ABS IGG, IGM, IFA, CSF
Lyme Disease AB (IgG), IBL: NOT DETECTED
Lyme Disease AB (IgM), IBL: NOT DETECTED

## 2018-01-17 LAB — OLIGOCLONAL BANDS, CSF + SERM

## 2018-01-17 MED ORDER — ACETAZOLAMIDE 250 MG PO TABS: 500.0000 mg | ORAL_TABLET | Freq: Two times a day (BID) | ORAL | 5 refills | Status: DC

## 2018-01-17 NOTE — Telephone Encounter (Signed)
-----   Message from Melvyn Novasarmen Dohmeier, MD sent at 01/17/2018 10:02 AM EST ----- 5 OLIGOCLONAL BANDS PRESENT IN SERUM AND CSF- THIS IS NOT MS NOT VASCULITIS, BUT PSEUDOTUMOR CEREBRI.   Patient needs to start on diamox.

## 2018-01-17 NOTE — Telephone Encounter (Signed)
Called the patient and made him aware that there was a elevated opening pressure when doing his LP. With that and the findings Dr Dohmeier confirms this diagnosis is pseudotumor cerebri. I have educated on what that is. I have also informed the pt that we will start him on a medicaton Diamox. The medication will be sent to the pharmacy on file. The patient has a follow up apt in march on the 13th and I have informed him to keep this apt

## 2018-01-18 ENCOUNTER — Other Ambulatory Visit: Payer: Self-pay | Admitting: Internal Medicine

## 2018-01-19 ENCOUNTER — Telehealth: Payer: Self-pay | Admitting: Internal Medicine

## 2018-01-19 DIAGNOSIS — J029 Acute pharyngitis, unspecified: Secondary | ICD-10-CM | POA: Diagnosis not present

## 2018-01-19 DIAGNOSIS — J069 Acute upper respiratory infection, unspecified: Secondary | ICD-10-CM | POA: Diagnosis not present

## 2018-01-19 NOTE — Telephone Encounter (Signed)
refill for novalog 90 supply Send to  [phone]

## 2018-01-22 NOTE — Telephone Encounter (Signed)
Sent on 01/18/18 pt will need to contact pharmacy

## 2018-01-24 DIAGNOSIS — J039 Acute tonsillitis, unspecified: Secondary | ICD-10-CM | POA: Diagnosis not present

## 2018-01-24 DIAGNOSIS — J351 Hypertrophy of tonsils: Secondary | ICD-10-CM | POA: Diagnosis not present

## 2018-01-31 ENCOUNTER — Encounter: Payer: Self-pay | Admitting: Neurology

## 2018-01-31 ENCOUNTER — Ambulatory Visit (INDEPENDENT_AMBULATORY_CARE_PROVIDER_SITE_OTHER): Payer: BLUE CROSS/BLUE SHIELD | Admitting: Neurology

## 2018-01-31 VITALS — BP 172/84 | HR 83 | Ht 72.0 in | Wt 320.0 lb

## 2018-01-31 DIAGNOSIS — G4701 Insomnia due to medical condition: Secondary | ICD-10-CM

## 2018-01-31 DIAGNOSIS — H4711 Papilledema associated with increased intracranial pressure: Secondary | ICD-10-CM | POA: Diagnosis not present

## 2018-01-31 DIAGNOSIS — Z794 Long term (current) use of insulin: Secondary | ICD-10-CM | POA: Diagnosis not present

## 2018-01-31 DIAGNOSIS — E1149 Type 2 diabetes mellitus with other diabetic neurological complication: Secondary | ICD-10-CM | POA: Diagnosis not present

## 2018-01-31 DIAGNOSIS — G932 Benign intracranial hypertension: Secondary | ICD-10-CM | POA: Diagnosis not present

## 2018-01-31 DIAGNOSIS — Z6841 Body Mass Index (BMI) 40.0 and over, adult: Secondary | ICD-10-CM

## 2018-01-31 DIAGNOSIS — R351 Nocturia: Secondary | ICD-10-CM

## 2018-01-31 DIAGNOSIS — I5032 Chronic diastolic (congestive) heart failure: Secondary | ICD-10-CM | POA: Diagnosis not present

## 2018-01-31 MED ORDER — ACETAZOLAMIDE 250 MG PO TABS: ORAL_TABLET | ORAL | 5 refills | Status: DC

## 2018-01-31 NOTE — Progress Notes (Addendum)
Neurology   Provider:  [name], M D  Primary Care Physician:  Patient, No Pcp Per   Referring Provider:  Ophthalmologist - Dr.  Jalene Mullet, MD .    Chief Complaint  Patient presents with  . Follow-up    pt alone, rm 11. pt states that he has no started the diamox yet because he wanted to talk to Dr Brett Fairy about the medication. He states that things are good.     HPI:  Tyler Aguirre is a 52 y.o. male , seen here as in a referral from [name] , MD  Berline Lopes provided an interesting history he had a severe form of right  pain right above the eyebrow , medially to the orbit.  This pain was so severe that he went first to a primary care evaluation and it was presumed that he has a flulike infection at the time.  He was discharged and just told to stay home hydrate well and give 3 days to see if the symptoms persist.  He developed more and more sinus pressure and return this time to a different urgent care in background a presumed diagnosis of sinusitis was made and he was placed on antibiotics.  He took these antibiotics for 5 days without relief.    The symptoms persisted and he returned [facility], date is 23 October 2017, from there he was sent to the emergency room at [facility]  I quote from the encounter notes that the patient has a diagnosis of : "allergic rhinitis, diabetes mellitus type 2, degenerative disc disease, obesity, hyperlipidemia hypertension, prostatitis and a remote history of transverse myelitis.  In December 2017 he also was diagnosed with chronic diastolic heart failure, grade 2 morbid obesity with comorbidity, type 2 diabetes was now associated with diabetic retinopathy at the time however without macular edema.  He is now followed for endocrinology by Dr. Benjiman Core." . From urgent care at [facility] he was sent to Dr. Posey Pronto ophthalmology, they found that Tyler Aguirre had papilledema, mentioned that he is currently treated  with amoxicillin, visual acuity was 20/25 on the right and 20/25 on the left, the intraocular pressures were 17 mm on the right 50 mm on the left, but there was no afferent pupillary defect noted, slit lamp was unremarkable, fundus revealed swelling of the optic nerves bilaterally.  Worse on the right over left.  Disc margins were blurred in both eyes.  The ophthalmologist done and return ordered an MRI of the brain and the referral to neurology.  The patient returned to the emergency room the same night. After being sent to the emergency room he was evaluated for vision changes, he was given steroids which improved both vision and headaches, he was still continued on high-dose antibiotics.  And after 2 days had a significant relief of the symptoms.  He was set up with neurology for outpatient follow up. He is finally here today.  He kept getting sick with sinusitis, and his ENT MD finally determined he needed to have a deviated septum repair , December 10 2017.   Chief complaint according to patient : resolved eye pain. Abnormal MRI brain.   Family history ; Tyler Aguirre has a strong family history and family disposition for diabetes mellitus.  He was diagnosed at a young age 85, initially treated with oral antidiabetics, and over the last 3 years has used insulin. Obesity. 10 years ago the patient was diagnosed with  transverse myelitis and lost sensation and control of the body below the navel.  Th12 level.  He recovered gradually he underwent a lumbar puncture, was treated with antibiotics, and hospitalized at [facility] at the time.    Paternal grandfather had DM2. Father had heart valvular disease, biological valve relacement. , maternal uncles had CAD. MI. Mother and maternal grandmother had osteoarthrtis, healthy brother.    Social history:  No tobacco use, no ETOH use, caffeine : once a day soda, not coffee. Works first shift, dusty environment, Producer, television/film/video. Not married.    I have the  pleasure of meeting with Tyler Aguirre today on 31 January 2018 and a revisit.  The patient underwent a spinal tap in order to work him up for bilateral papilledema.  The spinal tap returned with 8 red blood cells no white blood cells, clear supernatant, 5 oligoclonal bands were present in serum and CSF which means that these were introduced by the needle into the spinal fluid and not degenerated in the brain.  The opening pressure was 13 cmH2O and is extremely elevated indicating that the patient has pseudotumor cerebri.  Pseudotumor is a need condition that usually affects the more obese patients, it can also get worse while spending time in high altitude, and certain medications can sometimes promote pressure such as doxycycline.  In his case he has not been on any medication that is associated with promoting increased intracranial pressure, but he is diabetic.  I would like for him to start on Diamox by the way.  In order to assure that he can tolerate the Diamox I will start with a slow increasing dose.  I would like him to start with 250 mg in the morning and 500 mg in the later afternoon.  He is aware that the medication will cause more frequent urination I do not want to interrupt his nocturnal sleep.  I also do not want to risk lightheadedness.  Diamox may affect his gastrointestinal system. It can promote kidney stones.   He needs to hydrate in spite of using a medication to decrease fluid pressure.  CLINICAL DATA:  Papilledema.  Evaluate for demyelinating disease.  EXAM: DIAGNOSTIC LUMBAR PUNCTURE UNDER FLUOROSCOPIC GUIDANCE  FLUOROSCOPY TIME:  Fluoroscopy Time:  3 seconds.  Radiation Exposure Index (if provided by the fluoroscopic device): 14.96 Gy*m2  Number of Acquired Spot Images: 0  PROCEDURE: Informed consent was obtained from the patient prior to the procedure, including potential complications of headache, allergy, and pain. With the patient prone, the lower back was  prepped with Betadine. 1% Lidocaine was used for local anesthesia. Lumbar puncture was performed at the L2-L3 level using a 6 inch 20 gauge needle with return of clear, colorless CSF with an opening pressure of 30 cm water. 12.5 ml of CSF were obtained for laboratory studies. The patient tolerated the procedure well and there were no apparent complications.  IMPRESSION: 1. Technically successful fluoroscopically guided lumbar puncture. 2. Elevated opening pressure of 30 cm water.   Electronically Signed   By: Titus Dubin M.D.   On: 01/12/2018 09:03     Review of Systems: Out of a complete 14 system review, the patient complains of only the following symptoms, and all other reviewed systems are negative. Tyler Aguirre currently has no longer eye pain after being treated with antibiotics and steroids, he also has not noticed a lasting impairment in visual acuity, color vision, light perception, or perception of distance.  He can read  without difficulty.  Nocturia, times 3-5 each night ! Snoring, not sure he has apnea. Epworth 12/ 14 elevated.   Social History   Socioeconomic History  . Marital status: Single    Spouse name: Not on file  . Number of children: Not on file  . Years of education: Not on file  . Highest education level: Not on file  Social Needs  . Financial resource strain: Not on file  . Food insecurity - worry: Not on file  . Food insecurity - inability: Not on file  . Transportation needs - medical: Not on file  . Transportation needs - non-medical: Not on file  Occupational History  . Not on file  Tobacco Use  . Smoking status: Never Smoker  . Smokeless tobacco: Never Used  Substance and Sexual Activity  . Alcohol use: Yes  . Drug use: No  . Sexual activity: Yes    Birth control/protection: Condom  Other Topics Concern  . Not on file  Social History Narrative  . Not on file    Family History  Problem Relation Age of Onset  . Heart disease  Mother        valve dz  . Arthritis Mother        RA  . Anemia Mother   . Hypertension Father   . Heart disease Father   . Heart disease Maternal Uncle        CAD  . Diabetes Paternal Grandmother   . Obesity Paternal Grandmother   . Hypertension Paternal Grandmother   . Cancer Paternal Grandfather        lung  . Mental retardation Paternal Grandfather        suicided  . Hypertension Paternal Grandfather   . Diabetes Paternal Grandfather   . Obesity Maternal Grandfather   . Hypertension Maternal Grandfather   . Stroke Paternal Uncle   . Cancer Paternal Uncle     Past Medical History:  Diagnosis Date  . ALLERGIC RHINITIS 03/12/2009  . DIABETES MELLITUS, TYPE II 02/26/2007  . [place] DISEASE, LUMBAR 06/16/2009  . Flu 10/2017  . HYPERLIPIDEMIA 02/26/2007  . HYPERTENSION 02/26/2007  . NUMBNESS 04/17/2009  . Overweight(278.02) 02/26/2007  . Papilledema 10/2017  . PROSTATITIS, ACUTE 04/17/2009  . TRANSVERSE MYELITIS 05/01/2009  . Vision changes 10/2017    Past Surgical History:  Procedure Laterality Date  . COLONOSCOPY    . DENTAL SURGERY    . POLYPECTOMY      Current Outpatient Medications  Medication Sig Dispense Refill  . [place] Lancet Super Thin 30G MISC by Does not apply route. As direceted     . azelastine (ASTELIN) 0.1 % nasal spray Place 2 sprays into both nostrils 2 (two) times daily. Use in each nostril as directed 30 mL 6  . Continuous Blood Gluc Sensor (FREESTYLE LIBRE 14 DAY SENSOR) MISC 1 each by Does not apply route every 14 (fourteen) days. Change every 2 weeks 2 each 11  . glucose blood test strip 1 each by Other route as needed. Use as instructed 100 each 3  . Insulin Pen Needle (PEN NEEDLES 31GX5/16") 31G X 8 MM MISC Use 4x a day 100 each 3  . irbesartan (AVAPRO) 150 MG tablet Take 1 tablet (150 mg total) by mouth daily. 30 tablet 11  . NOVOLOG FLEXPEN 100 UNIT/ML FlexPen INJECT 40-50 UNITS 3 TIMES A DAY 45 pen 0  . PROCTOSOL HC 2.5 % rectal cream APPLY RECTALLY  TWICE DAILY 28.35 g 0  . acetaZOLAMIDE (DIAMOX)  250 MG tablet Take 2 tablets (500 mg total) by mouth 2 (two) times daily. (Patient not taking: Reported on 01/31/2018) 60 tablet 5   No current facility-administered medications for this visit.     Allergies as of 01/31/2018 - Review Complete 01/31/2018  Allergen Reaction Noted  . Ibuprofen Other (See Comments) 08/13/2014  . Metformin and related Diarrhea 11/09/2015    Vitals: BP (!) 172/84   Pulse 83   Ht 6' (1.829 m)   Wt (!) 320 lb (145.2 kg)   BMI 43.40 kg/m  Last Weight:  Wt Readings from Last 1 Encounters:  01/31/18 (!) 320 lb (145.2 kg)   IEP:PIRJ mass index is 43.4 kg/m.     Last Height:   Ht Readings from Last 1 Encounters:  01/31/18 6' (1.829 m)    Physical exam:  General: The patient is awake, alert and appears not in acute distress. The patient is well groomed. Head: Normocephalic, atraumatic.  Large neck, overweight. Facial hair.  Cardiovascular:  Regular rate and rhythm , without  murmurs or carotid bruit, and without distended neck veins. Respiratory: Lungs are clear to auscultation.  The patient is status post nasal septoplasty just 14 days ago, has recovered very well and for the most part has a air patent nasal passage. Skin:  Without evidence of edema, or rash Trunk: BMI is 42. 1. The patient's posture is erect  Neurologic exam :The patient is awake and alert, oriented to place and time.   Attention span & concentration ability appears normal.  Speech is fluent,  without dysarthria, dysphonia or aphasia.  Mood and affect are appropriate.  Cranial nerves:Pupils are equal and briskly reactive to light. Funduscopic exam with  evidence of mild  Edema OD, none in OS. Extraocular movements  in vertical and horizontal planes intact and without nystagmus. Visual fields by finger perimetry are intact. Hearing to finger rub intact.  Facial sensation intact to fine touch. Facial motor strength is symmetric and tongue and  uvula move midline. Shoulder shrug was symmetrical.  Motor exam:   Normal tone, muscle bulk and symmetric strength in all extremities. Sensory:  Fine touch, pinprick and vibration were intact . Proprioception tested in the upper extremities was normal. Coordination: Rapid alternating movements in the fingers/hands was normal. Finger-to-nose maneuver  normal without evidence of ataxia, dysmetria or tremor. Gait and station: Patient walks without assistive device  .Tandem gait is unfragmented. Turns with 3  Steps.  Deep tendon reflexes: in the  upper and lower extremities are symmetric and intact.   Assessment:  After physical and neurologic examination, review of laboratory studies,  Personal review of imaging studies, reports of other /same  Imaging studies, results of polysomnography and / or neurophysiology testing and pre-existing records as far as provided in visit., my assessment is.  Reviewed 2 hospital records and treatment protocol.  Urgent care notes, and I thank Dr. Posey Pronto for the ophthalmology notes.  This was really just a cover letter. CLINICAL DATA:  Initial evaluation for acute headaches, right eye pain and swelling.  EXAM: MRI HEAD AND ORBITS WITHOUT AND WITH CONTRAST  TECHNIQUE: Multiplanar, multiecho pulse sequences of the brain and surrounding structures were obtained without and with intravenous contrast. Multiplanar, multiecho pulse sequences of the orbits and surrounding structures were obtained including fat saturation techniques, before and after intravenous contrast administration.  CONTRAST:  26m MULTIHANCE GADOBENATE DIMEGLUMINE 529 MG/ML IV SOLN  COMPARISON:  Prior MRI from 04/21/2009.  FINDINGS: MRI HEAD FINDINGS  Brain: Cerebral volume  within normal limits. Asymmetric confluent T2/FLAIR hyperintensity within the left periatrial white matter, similar to previous. Additional mild T2/FLAIR hyperintensity within the periventricular white matter both  cerebral hemispheres, nonspecific, but mildly progressed relative to previous exam. One of these foci position within the right periatrial white matter is oriented perpendicular to the lateral ventricle (series 5, image 14).  No abnormal foci of restricted diffusion to suggest acute or subacute ischemia. Gray-white matter differentiation maintained. No other evidence for chronic infarction. No foci of susceptibility artifact to suggest acute or chronic intracranial hemorrhage.  No mass lesion, midline shift or mass effect. No hydrocephalus. No extra-axial fluid collection. Major dural sinuses are grossly patent. No findings to suggest cavernous sinus thrombosis.  Pituitary gland suprasellar region within normal limits. Midline structures intact and normal.  Vascular: Major intracranial vascular flow voids are maintained.  Skull and upper cervical spine: Craniocervical junction normal. Upper cervical spine within normal limits. Bone marrow signal intensity normal.  MRI ORBITS FINDINGS  Orbits:  Study degraded by motion artifact.  Globes are symmetric in size with normal morphology. Optic nerves symmetric in size and normal in appearance. No appreciable optic nerve edema or abnormal enhancement. No abnormality seen at the orbital apices are about the cavernous sinus. Optic chiasm normally position within the suprasellar cistern. Extra-ocular muscles are normal in appearance and symmetric bilaterally. Lacrimal glands are normal. Superior orbital veins symmetric and within normal limits bilaterally. Intraconal and extraconal fat well-maintained.  Visualized sinuses: Mild scattered mucoperiosteal thickening within the ethmoidal air cells. Visualized paranasal sinuses are otherwise clear. No air-fluid level to suggest acute sinusitis.  Soft tissues: No appreciable periorbital soft tissue abnormality.  IMPRESSION: 1. No acute intracranial abnormality. 2. Asymmetric  confluent T2/FLAIR hyperintensity within the left periatrial white matter, similar to previous. Additional mild cerebral white matter changes are mildly progressed relative 2010. Findings are nonspecific, and may due to chronic small vessel ischemic changes. However, one of these foci positioned at the right periatrial white matter is oriented perpendicular to the lateral ventricle, which can be seen in the setting of underlying demyelinating disease. Clinical correlation recommended. No findings to suggest active demyelination on this exam. 3. Normal MRI of the orbits. No abnormality about the cavernous sinus. 4. Mild ethmoidal sinus disease, likely allergic/inflammatory nature.   Electronically Signed   By: Jeannine Boga M.D.   On: 10/26/2017 22:21    Review of the brain MRI in the presence of the patient and looked at the images, there is some mild hyperintensities periventricular these are nonspecific and would not constitute a sign of a demyelinating disease.  The left lateral ventricle appears enlarged in comparison to the right and there is periatrial white matter restriction, a possible scar formation allowing fluid to occupy the room ex vacuo.  Additionally a severely deviated nasal septum is noted.  The orbits appear normal.  There is no evidence of any pressure on the right eye. [name], MD   Vitals   Height Weight BMI (Calculated)  6' (1.829 m) 294 lb 8.6 oz (133.6 kg) 39     1) Tyler Aguirre brain MRI shows an older lesion, that has shrunken the left parietal lobe tissue, and allowed the left lateral ventricle to enlarge ex vacuo.  There is also a very small frontal deep white matter lesion that is perpendicular to the ventricle.  Consider that he had a transverse myelitis 10 years ago I am not sure that these did not already were present at the time and related  to a acute demyelinating encephalopathy at the time.  The patient now however presented with  bilateral papilledema right over left and there was no orbital structure noted that would impose pressure on the back of the eye.  He has undergone sinus surgery steroid and antibiotic treatment and has recovered well.  We could rule out a demyelinating underlying disorder- He had an opening pressure of 30 cm water, 5 oligo clonals that were also present in serum- not produced in CNS.   The patient was advised of the nature of the diagnosed disorder , the treatment options and the  risks for general health and wellness arising from not treating the condition.   I spent more than 20 minutes of face to face time with the patient.  Greater than 50% of time was spent in counseling and coordination of care. We have discussed the diagnosis and differential and I answered the patient's questions.    Plan:  Treatment plan and additional workup :  Weight management - referral to Dr. Dennard Nip,  URGENT- weight loss is needed to control DM and pseudotumor cerebri.  Diamox start at 250 mg AM and 500 mg afternoon- for 14 days and after that start 500 mg bid.  Exercise with a Physiological scientist. Make a contract- a commitment.  I will test him for organic sleep disorders related to his reported insomnia, nocturia and snoring. He is prime candidate for OSA .     [name], MD 1/61/0960, 4:54 AM  Certified in Neurology by ABPN Certified in [place] by [facility] [address] [place], [place] [address]

## 2018-01-31 NOTE — Addendum Note (Signed)
Addended by: Melvyn NovasHMEIER, Tonni Mansour on: 01/31/2018 10:33 AM   Modules accepted: Orders

## 2018-01-31 NOTE — Patient Instructions (Signed)
Acetazolamide tablets What is this medicine? ACETAZOLAMIDE (a set a ZOLE a mide) is used to treat glaucoma and some seizure disorders. It may be used to treat edema or swelling from heart failure or from other medicines. This medicine is also used to treat and to prevent altitude or mountain sickness. This medicine may be used for other purposes; ask your health care provider or pharmacist if you have questions. COMMON BRAND NAME(S): Diamox What should I tell my health care provider before I take this medicine? They need to know if you have any of these conditions: -diabetes -kidney disease -liver disease -lung disease -an unusual or allergic reaction to acetazolamide, sulfa drugs, other medicines, foods, dyes, or preservatives -pregnant or trying to get pregnant -breast-feeding How should I use this medicine? Take this medicine by mouth with a glass of water. Follow the directions on the prescription label. Take this medicine with food if it upsets your stomach. Take your doses at regular intervals. Do not take your medicine more often than directed. Do not stop taking except on your doctor's advice. Talk to your pediatrician regarding the use of this medicine in children. Special care may be needed. Patients over 65 years old may have a stronger reaction and need a smaller dose. Overdosage: If you think you have taken too much of this medicine contact a poison control center or emergency room at once. NOTE: This medicine is only for you. Do not share this medicine with others. What if I miss a dose? If you miss a dose, take it as soon as you can. If it is almost time for your next dose, take only that dose. Do not take double or extra doses. What may interact with this medicine? Do not take this medicine with any of the following medications: -methazolamide This medicine may also interact with the following medications: -aspirin and aspirin-like  medicines -cyclosporine -lithium -medicine for diabetes -methenamine -other diuretics -phenytoin -primidone -quinidine -sodium bicarbonate -stimulant medicines like dextroamphetamine This list may not describe all possible interactions. Give your health care provider a list of all the medicines, herbs, non-prescription drugs, or dietary supplements you use. Also tell them if you smoke, drink alcohol, or use illegal drugs. Some items may interact with your medicine. What should I watch for while using this medicine? Visit your doctor or health care professional for regular checks on your progress. You will need blood work done regularly. If you are diabetic, check your blood sugar as directed. You may need to be on a special diet while taking this medicine. Ask your doctor. Also, ask how many glasses of fluid you need to drink a day. You must not get dehydrated. You may get drowsy or dizzy. Do not drive, use machinery, or do anything that needs mental alertness until you know how this medicine affects you. Do not stand or sit up quickly, especially if you are an older patient. This reduces the risk of dizzy or fainting spells. This medicine can make you more sensitive to the sun. Keep out of the sun. If you cannot avoid being in the sun, wear protective clothing and use sunscreen. Do not use sun lamps or tanning beds/booths. What side effects may I notice from receiving this medicine? Side effects that you should report to your doctor or health care professional as soon as possible: -allergic reactions like skin rash, itching or hives, swelling of the face, lips, or tongue -breathing problems -confusion, depression -dark urine -fever -numbness, tingling in hands or feet -  redness, blistering, peeling or loosening of the skin, including inside the mouth -ringing in the ears -seizures -unusually weak or tired -yellowing of the eyes or skin Side effects that usually do not require medical  attention (report to your doctor or health care professional if they continue or are bothersome): -change in taste -diarrhea -headache -loss of appetite -nausea, vomiting -passing urine more often This list may not describe all possible side effects. Call your doctor for medical advice about side effects. You may report side effects to FDA at 1-800-FDA-1088. Where should I keep my medicine? Keep out of the reach of children. Store at room temperature between 20 and 25 degrees C (68 and 77 degrees F). Throw away any unused medicine after the expiration date. NOTE: This sheet is a summary. It may not cover all possible information. If you have questions about this medicine, talk to your doctor, pharmacist, or health care provider.  2018 Elsevier/Gold Standard (2008-01-30 10:59:40)  

## 2018-02-15 DIAGNOSIS — R0609 Other forms of dyspnea: Secondary | ICD-10-CM | POA: Diagnosis not present

## 2018-02-15 DIAGNOSIS — Z794 Long term (current) use of insulin: Secondary | ICD-10-CM | POA: Diagnosis not present

## 2018-02-15 DIAGNOSIS — G932 Benign intracranial hypertension: Secondary | ICD-10-CM | POA: Diagnosis not present

## 2018-02-15 DIAGNOSIS — E119 Type 2 diabetes mellitus without complications: Secondary | ICD-10-CM | POA: Diagnosis not present

## 2018-02-26 ENCOUNTER — Ambulatory Visit (INDEPENDENT_AMBULATORY_CARE_PROVIDER_SITE_OTHER): Payer: BLUE CROSS/BLUE SHIELD | Admitting: Neurology

## 2018-02-26 DIAGNOSIS — G4733 Obstructive sleep apnea (adult) (pediatric): Secondary | ICD-10-CM

## 2018-02-26 DIAGNOSIS — G932 Benign intracranial hypertension: Secondary | ICD-10-CM

## 2018-02-26 DIAGNOSIS — E1149 Type 2 diabetes mellitus with other diabetic neurological complication: Secondary | ICD-10-CM

## 2018-02-26 DIAGNOSIS — Z794 Long term (current) use of insulin: Secondary | ICD-10-CM

## 2018-02-26 DIAGNOSIS — H4711 Papilledema associated with increased intracranial pressure: Secondary | ICD-10-CM

## 2018-02-26 DIAGNOSIS — G4701 Insomnia due to medical condition: Secondary | ICD-10-CM

## 2018-02-27 ENCOUNTER — Telehealth: Payer: Self-pay | Admitting: Neurology

## 2018-02-27 NOTE — Telephone Encounter (Signed)
-----   Message from Melvyn Novasarmen Dohmeier, MD sent at 02/27/2018  1:16 PM EDT ----- Mild sleep apnea with minor hypoxemia burden. #Tachycardia was  likely artefact. RECOMMENDATION: This mild apnea can be treated with CPAP, with a  dental device and with weight loss. Weight loss is essential in  reducing the intracranial pressure.   If patient agrees will do   an auto-titration CPAP 5-15 cm water- and has already been referred  to  weight loss clinic.

## 2018-02-27 NOTE — Addendum Note (Signed)
Addended by: Melvyn NovasHMEIER, Ruweyda Macknight on: 02/27/2018 01:16 PM   Modules accepted: Orders

## 2018-02-27 NOTE — Procedures (Signed)
NAME: Tyler Aguirre                                                                     DOB: 06/20/1966 MEDICAL RECORD No: [id number]                                                  DOS: 02/26/2018 REFERRING PHYSICIAN: Jalene Mullet, M.D. Ophthalmology STUDY PERFORMED: Home Sleep Study by apnea link HISTORY: [name] is a 52 y.o. male, seen here in a referral from [name], MD   This patient has a diagnosis of : "allergic rhinitis, diabetes mellitus type 2, degenerative disc disease, obesity, hyperlipidemia hypertension, prostatitis and a remote history of transverse myelitis, chronic diastolic heart failure, grade 2 morbid obesity with comorbidity, type 2 diabetes associated with diabetic retinopathy, however without macular edema".  Dr. Posey Pronto (ophthalmology), found that [name] had papilledema, mentioned that he is currently treated with amoxicillin, visual acuity was 20/25 on the right and 20/25 on the left, the intraocular pressures were 17 mm on the right 50 mm on the left, but there was no afferent pupillary defect noted, slit lamp was unremarkable, fundus revealed swelling of the optic nerves bilaterally.   The ophthalmologist ordered an MRI of the brain and the referral to neurology. Under our guidance the patient underwent LP on 01-12-2018.  Opening pressure was 30 cm water artefact!  Diagnosis was pseudotumor cerebri. He underwent a nasal septoplasty, and now is evaluated for OSA.    BMI: 43.3   Epworth Sleepiness score- 15/24  STUDY RESULTS:  Total Recording Time: 9 hours, 28 minutes. Total Apnea/Hypopnea Index (AHI):  17.6 /h; RDI was 19.7 /h. Average Oxygen Saturation:  93 %; Lowest Oxygen Desaturation: 84%.  Total Time in Oxygen Saturation below 89 % was 11.0 minutes.  Average Heart Rate: 80 bpm (between 66 and 237* bpm). IMPRESSION: Mild sleep apnea with minor hypoxemia burden. *Tachycardia was likely artefact. RECOMMENDATION: This mild apnea can be treated with CPAP, with a  dental device and with weight loss. Weight loss is essential in reducing the intracranial pressure. If patient agrees will do both- an auto-titration CPAP 5-15 cm water and a referral to weight loss clinic.   [name], M.D.    02-28-2018  Medical Director of [facility] at [facility], accredited by the AASM Diplomat of the ABPN and ABSM

## 2018-02-27 NOTE — Telephone Encounter (Signed)
Called patient to discuss sleep study results. No answer at this time. LVM for the patient to call back.   

## 2018-03-01 ENCOUNTER — Encounter (INDEPENDENT_AMBULATORY_CARE_PROVIDER_SITE_OTHER): Payer: BLUE CROSS/BLUE SHIELD

## 2018-03-01 NOTE — Telephone Encounter (Signed)
Called the patient today and was able to review the sleep study with him. I went over the study with him and listed out the recommendations and treatment options with the patient. The patient has requested a copy of the sleep study so that he may have the opportunity to do some research. I will mail a copy today and the patient was instructed to let us know which treatment path he would like to take. Pt verbalized understanding.

## 2018-03-06 ENCOUNTER — Encounter: Payer: Self-pay | Admitting: Internal Medicine

## 2018-03-06 ENCOUNTER — Ambulatory Visit (INDEPENDENT_AMBULATORY_CARE_PROVIDER_SITE_OTHER): Payer: BLUE CROSS/BLUE SHIELD | Admitting: Internal Medicine

## 2018-03-06 VITALS — BP 148/78 | HR 90 | Ht 72.0 in | Wt 311.6 lb

## 2018-03-06 DIAGNOSIS — Z794 Long term (current) use of insulin: Secondary | ICD-10-CM | POA: Diagnosis not present

## 2018-03-06 DIAGNOSIS — E782 Mixed hyperlipidemia: Secondary | ICD-10-CM | POA: Diagnosis not present

## 2018-03-06 DIAGNOSIS — E11319 Type 2 diabetes mellitus with unspecified diabetic retinopathy without macular edema: Secondary | ICD-10-CM | POA: Diagnosis not present

## 2018-03-06 LAB — POCT GLYCOSYLATED HEMOGLOBIN (HGB A1C): Hemoglobin A1C: 7.7

## 2018-03-06 NOTE — Progress Notes (Signed)
Patient ID: Tyler Aguirre, male   DOB: [birth year], 52 y.o.   MRN: [id number]   HPI: Tyler Aguirre is a 52 y.o.-year-old male, returning for follow-up for DM2, dx in the 1990s, insulin-dependent, uncontrolled, with complications (DR). Last visit 3 mo ago.  He has pbs sleeping  >> will start a nasal prong for OSA.   He had SOB during exercise >> had an EKG >> sent to cardiology.  He also started Avapro.  [facility]  Since last visit, he improved his diet: less snacks, no sodas.  Last hemoglobin A1c was: Lab Results  Component Value Date   HGBA1C 7.7 03/06/2018   HGBA1C 8 12/05/2017   HGBA1C 8.2 08/08/2017   He is on: - Toujeo 65 units at bedtime  - restarted at last visit (prev. Stopped b/c AP + hunger in am). No sxs now. - Humalog 10-15 min before a meal - But misses b'fast dose - 40 units before a smaller meal - 50 units before a regular meal  -  He could not tolerate Trulicity 2.97 mg weekly  >> nausea. He had to stop Invokana in the past because of constipation. He stopped Metformin 2/2 GI intolerance.  Pt checks his sugars frequently with the freestyle libre CGM: ave 267+/-105, coefficient of variation 39.3% Time in range:  - low (<70): 2% >> 2% - normal range (70-180): 19% >> 25% - high sugars (>180): 79% >> 73%    25-75% range: - am:  120-200s>> 180-190s >> 150-255 >> 110-180 - 2h after b'fast: n/c >> 200-260 >> 275-330 - before lunch: 130-160 >> n/c >> 250-300 >> 280-350 - 2h after lunch: 350 >> n/c >> >350 >> >325 - before dinner:  180-200s >> 300->350 >> >300 - 2h after dinner: 327, 333 >> n/c >> 190-330 >> >310  - bedtime: n/c >> 150-210 >> 100 180 - nighttime: n/c >> 60 at 12 am >> 100-220 >> 110-180  Lowest sugar was 39 (woke him up) x1 - very active, small dinner >> ... >> 49 at 4 am >> 40 in a.m.; he has hypoglycemia awareness at 80s. 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  -No CKD, last BUN/creatinine:  Lab Results  Component Value Date   BUN 10 10/27/2017   BUN 12 10/26/2017   CREATININE 0.75 10/27/2017   CREATININE 0.75 10/26/2017  On Avapro.  He has a history of high ACR, which has resolved: Lab Results  Component Value Date   MICRALBCREAT 1.9 08/22/2016   MICRALBCREAT 6.7 01/22/2010   MICRALBCREAT 4.1 09/02/2009   MICRALBCREAT 6.9 02/05/2009   MICRALBCREAT 13.9 11/30/2007   -+ HL; last set of lipids: Lab Results  Component Value Date   CHOL 214 (H) 08/22/2016   HDL 46.00 08/22/2016   LDLCALC 150 (H) 08/22/2016   LDLDIRECT 147.8 01/22/2010   TRIG 92.0 08/22/2016   CHOLHDL 5 08/22/2016  On Zocor. - last eye exam was in 05/2017: + DR -No numbness and tingling in his feet.  He had an episode of presyncope when exercising in the park in 2018. An EKG, exercise stress test, 2D Echo (Dr. Sallyanne Kuster) >> normal.  He recently saw  neurology and was diagnosed with pseudotumor cerebri.  He was advised to start Diamox >> could not start b/c dizziness..  ROS: Constitutional: no weight gain/no weight loss, no fatigue, no subjective hyperthermia, no subjective hypothermia Eyes: no blurry vision, no xerophthalmia ENT: no sore throat, no nodules palpated in throat, no dysphagia, no odynophagia, no hoarseness Cardiovascular: no CP/no SOB/no palpitations/no leg swelling Respiratory: no cough/no SOB/no wheezing Gastrointestinal: no N/no V/no D/no C/no acid reflux Musculoskeletal: no muscle aches/no joint aches Skin: no rashes, no hair loss Neurological: no tremors/no numbness/no tingling/no dizziness  I reviewed pt's medications, allergies, PMH, social hx, family hx, and changes were documented in the history of present illness. Otherwise, unchanged from my  initial visit note.  Past Medical History:  Diagnosis Date  . ALLERGIC RHINITIS 03/12/2009  . DIABETES MELLITUS, TYPE II 02/26/2007  . [place] DISEASE, LUMBAR 06/16/2009  . Flu 10/2017  . HYPERLIPIDEMIA 02/26/2007  . HYPERTENSION 02/26/2007  . NUMBNESS 04/17/2009  . Overweight(278.02) 02/26/2007  . Papilledema 10/2017  . PROSTATITIS, ACUTE 04/17/2009  . TRANSVERSE MYELITIS 05/01/2009  . Vision changes 10/2017   Past Surgical History:  Procedure Laterality Date  . COLONOSCOPY    . DENTAL SURGERY    . POLYPECTOMY     Social History   Social History  . Marital status: Single    Spouse name: N/A  . 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 Smoking status: Never Smoker  . Smokeless tobacco: Never Used  . Alcohol use Yes  . Drug use: No   Current Outpatient Medications on File Prior to Visit  Medication Sig Dispense Refill  . acetaZOLAMIDE (DIAMOX) 250 MG tablet Start with 250 mg in AM and 500 mg in afternoon by mouth for 14 days, later advance to 500 mg bid. 60 tablet 5  . Aurora Lancet Super Thin 30G MISC by Does not apply route. As direceted     . azelastine (ASTELIN) 0.1 % nasal spray Place 2 sprays into both nostrils 2 (two) times daily. Use in each nostril as directed 30 mL 6  . Continuous Blood Gluc Sensor (FREESTYLE LIBRE 14 DAY SENSOR) MISC 1 each by Does not apply route every 14 (fourteen) days. Change every 2 weeks 2 each 11  . glucose blood test strip 1 each by Other route as needed. Use as instructed 100 each 3  . Insulin Pen Needle (PEN NEEDLES 31GX5/16") 31G X 8 MM MISC Use 4x a day 100 each 3  . irbesartan (AVAPRO) 150 MG tablet Take 1 tablet (150 mg total) by mouth daily. 30 tablet 11  . NOVOLOG FLEXPEN 100 UNIT/ML FlexPen INJECT 40-50 UNITS 3 TIMES A DAY 45 pen 0  . PROCTOSOL HC 2.5 % rectal cream APPLY RECTALLY TWICE DAILY 28.35 g 0   No current facility-administered medications on file prior to visit.     Allergies  Allergen Reactions  . Ibuprofen Other (See Comments)    Has to go to the bathroom all the time  . Metformin And Related Diarrhea    Unable to tolerate XL formulation.    Family History  Problem Relation Age of Onset  . Heart disease Mother        valve dz  . Arthritis Mother        RA  . Anemia Mother   . Hypertension Father   . Heart disease Father   . Heart disease  Maternal Uncle        CAD  . Diabetes Paternal Grandmother   . Obesity Paternal Grandmother   . Hypertension Paternal Grandmother   . Cancer Paternal Grandfather        lung  . Mental retardation Paternal Grandfather        suicided  . Hypertension Paternal Grandfather   . Diabetes Paternal Grandfather   . Obesity Maternal Grandfather   . Hypertension Maternal Grandfather   . Stroke Paternal Uncle   . Cancer Paternal Uncle    PE: BP (!) 148/78   Pulse 90   Ht 6' (1.829 m)   Wt (!) 311 lb 9.6 oz (141.3 kg)   SpO2 96%   BMI 42.26 kg/m  Wt Readings from Last 3 Encounters:  03/06/18 (!) 311 lb 9.6 oz (141.3 kg)  01/31/18 (!) 320 lb (145.2 kg)  12/28/17 (!) 311 lb (141.1 kg)   Constitutional: overweight, in NAD Eyes: PERRLA, EOMI, no exophthalmos ENT: moist mucous membranes, no thyromegaly, no cervical lymphadenopathy Cardiovascular: RRR, No MRG Respiratory: CTA B Gastrointestinal: abdomen soft, NT, ND, BS+ Musculoskeletal: no deformities, strength intact in all 4 Skin: moist, warm, no rashes Neurological: no tremor with outstretched hands, DTR normal in all 4  ASSESSMENT: 1. DM2, insulin-dependent, uncontrolled, with complications - DR  2. Obesity class 3  PLAN:  1. Patient with long-standing, uncontrolled, diabetes, on basal-bolus insulin regimen, with long-acting insulin added back at last visit. - Reviewing his CGM downloads, his sugars are lower in the morning and they increase precipitously after breakfast, and he is telling me that he is not taking insulin before this meal.   Subsequently, he is high from approximately 9 AM to approximately 10 PM, with small fluctuations after lunch and dinner, but with a precipitous decrease in blood sugars after bedtime.  He is telling me that he is taking a correction of 40 units of Humalog at bedtime (!).  He is occasionally low at night and he wakes up with cold sweats, or in the morning, as per the CGM downloads.  Lowest sugar in the morning was 40, but he also has sugars in the 50s and 60s. - I advised him to always take insulin if he is planning to eat breakfast - We will keep the insulin with lunch and dinner stable - We will increase the Toujeo in the morning - I advised him to try not to correct at night but if absolutely needed, he should not take more than 20 or at the very maximum 30 units to avoid lows in the morning. - He may require U500 insulin soon if he does not lose a significant amount of weight and decrease his insulin resistance. - I suggested to:  Patient Instructions  Please increase: - Toujeo to 80 units in a.m.  Please continue: - Humalog 10-15 min before a meal. - 40 units before a smaller meal - 50 units before a regular meal  - 60 units before a larger meal  Do not miss the b'fast Humalog.  If you need to do a correction at bedtime, do not take >20-30.  Please return in 3 months with your sugar log.   - today, HbA1c is 7.7% (slightly better) - continue checking sugars at different times of the day - check 3x a day, rotating checks - advised for yearly eye exams >> he is UTD - Return to clinic in 3 mo with sugar log     2. Obesity class 3 - He  lost 9 pounds in the last month by changing his diet: Reducing snacks and cutting out sodas) - he also started to go to the weight loss clinic -Other medicines that could help with weight loss: SGLT2 inhibitors and GLP-1 receptor agonists cause side effects in the past: Constipation and nausea, respectively.  He is open to restart these if really needed  in the future.  3. HL - Reviewed latest lipid panel from 2017: LDL quite high - Continues the statin without side effects. -He is due for another lipid panel and has an appointment with PCP in few days.  Philemon Kingdom, MD PhD [facility] Endocr[facility]

## 2018-03-06 NOTE — Patient Instructions (Addendum)
Please increase: - Toujeo to 80 units in a.m.  Please continue: - Humalog 10-15 min before a meal. - 40 units before a smaller meal - 50 units before a regular meal  - 60 units before a larger meal  Do not miss the b'fast Humalog.  If you need to do a correction at bedtime, do not take >20-30.  Please return in 3 months with your sugar log.

## 2018-03-08 ENCOUNTER — Ambulatory Visit (INDEPENDENT_AMBULATORY_CARE_PROVIDER_SITE_OTHER): Payer: BLUE CROSS/BLUE SHIELD | Admitting: Family Medicine

## 2018-03-08 ENCOUNTER — Encounter (INDEPENDENT_AMBULATORY_CARE_PROVIDER_SITE_OTHER): Payer: Self-pay | Admitting: Family Medicine

## 2018-03-08 VITALS — BP 159/76 | HR 84 | Temp 98.3°F | Ht 72.0 in | Wt 306.0 lb

## 2018-03-08 DIAGNOSIS — I5032 Chronic diastolic (congestive) heart failure: Secondary | ICD-10-CM | POA: Diagnosis not present

## 2018-03-08 DIAGNOSIS — Z1331 Encounter for screening for depression: Secondary | ICD-10-CM | POA: Diagnosis not present

## 2018-03-08 DIAGNOSIS — E1169 Type 2 diabetes mellitus with other specified complication: Secondary | ICD-10-CM

## 2018-03-08 DIAGNOSIS — G4730 Sleep apnea, unspecified: Secondary | ICD-10-CM

## 2018-03-08 DIAGNOSIS — Z6841 Body Mass Index (BMI) 40.0 and over, adult: Secondary | ICD-10-CM

## 2018-03-08 DIAGNOSIS — Z0289 Encounter for other administrative examinations: Secondary | ICD-10-CM

## 2018-03-08 DIAGNOSIS — I10 Essential (primary) hypertension: Secondary | ICD-10-CM

## 2018-03-08 DIAGNOSIS — R5383 Other fatigue: Secondary | ICD-10-CM

## 2018-03-08 DIAGNOSIS — R0602 Shortness of breath: Secondary | ICD-10-CM

## 2018-03-08 DIAGNOSIS — Z9189 Other specified personal risk factors, not elsewhere classified: Secondary | ICD-10-CM | POA: Diagnosis not present

## 2018-03-09 LAB — CBC WITH DIFFERENTIAL/PLATELET
BASOS ABS: 0 10*3/uL (ref 0.0–0.2)
Basos: 1 %
EOS (ABSOLUTE): 0.2 10*3/uL (ref 0.0–0.4)
Eos: 3 %
Hematocrit: 39.7 % (ref 37.5–51.0)
Hemoglobin: 13.2 g/dL (ref 13.0–17.7)
IMMATURE GRANS (ABS): 0 10*3/uL (ref 0.0–0.1)
Immature Granulocytes: 0 %
LYMPHS ABS: 1.7 10*3/uL (ref 0.7–3.1)
LYMPHS: 27 %
MCH: 31.4 pg (ref 26.6–33.0)
MCHC: 33.2 g/dL (ref 31.5–35.7)
MCV: 95 fL (ref 79–97)
MONOCYTES: 14 %
Monocytes Absolute: 0.8 10*3/uL (ref 0.1–0.9)
NEUTROS ABS: 3.5 10*3/uL (ref 1.4–7.0)
Neutrophils: 55 %
PLATELETS: 121 10*3/uL — AB (ref 150–379)
RBC: 4.2 x10E6/uL (ref 4.14–5.80)
RDW: 14.9 % (ref 12.3–15.4)
WBC: 6.2 10*3/uL (ref 3.4–10.8)

## 2018-03-09 LAB — LIPID PANEL
Chol/HDL Ratio: 4.8 ratio (ref 0.0–5.0)
Cholesterol, Total: 246 mg/dL — ABNORMAL HIGH (ref 100–199)
HDL: 51 mg/dL (ref >39)
LDL Calculated: 167 mg/dL — ABNORMAL HIGH (ref 0–99)
Triglycerides: 142 mg/dL (ref 0–149)
VLDL CHOLESTEROL CAL: 28 mg/dL (ref 5–40)

## 2018-03-09 LAB — HEMOGLOBIN A1C
ESTIMATED AVERAGE GLUCOSE: 169 mg/dL
HEMOGLOBIN A1C: 7.5 % — AB (ref 4.8–5.6)

## 2018-03-09 LAB — COMPREHENSIVE METABOLIC PANEL
ALK PHOS: 149 IU/L — AB (ref 39–117)
ALT: 62 IU/L — AB (ref 0–44)
AST: 89 IU/L — AB (ref 0–40)
Albumin/Globulin Ratio: 1 — ABNORMAL LOW (ref 1.2–2.2)
Albumin: 3.3 g/dL — ABNORMAL LOW (ref 3.5–5.5)
BILIRUBIN TOTAL: 1.5 mg/dL — AB (ref 0.0–1.2)
BUN/Creatinine Ratio: 19 (ref 9–20)
BUN: 13 mg/dL (ref 6–24)
CO2: 21 mmol/L (ref 20–29)
CREATININE: 0.69 mg/dL — AB (ref 0.76–1.27)
Calcium: 8.4 mg/dL — ABNORMAL LOW (ref 8.7–10.2)
Chloride: 105 mmol/L (ref 96–106)
GFR calc Af Amer: 128 mL/min/{1.73_m2} (ref >59)
GFR calc non Af Amer: 111 mL/min/{1.73_m2} (ref >59)
GLUCOSE: 106 mg/dL — AB (ref 65–99)
Globulin, Total: 3.3 g/dL (ref 1.5–4.5)
Potassium: 3.7 mmol/L (ref 3.5–5.2)
Sodium: 140 mmol/L (ref 134–144)
Total Protein: 6.6 g/dL (ref 6.0–8.5)

## 2018-03-09 LAB — INSULIN, RANDOM: INSULIN: 15.7 u[IU]/mL (ref 2.6–24.9)

## 2018-03-09 LAB — VITAMIN B12: VITAMIN B 12: 1039 pg/mL (ref 232–1245)

## 2018-03-09 LAB — T3: T3, Total: 94 ng/dL (ref 71–180)

## 2018-03-09 LAB — T4, FREE: FREE T4: 0.88 ng/dL (ref 0.82–1.77)

## 2018-03-09 LAB — FOLATE: FOLATE: 12.9 ng/mL (ref >3.0)

## 2018-03-09 LAB — TSH: TSH: 4.11 u[IU]/mL (ref 0.450–4.500)

## 2018-03-09 LAB — VITAMIN D 25 HYDROXY (VIT D DEFICIENCY, FRACTURES)
VIT D 25 HYDROXY: 9.3 ng/mL — AB (ref 30.0–100.0)
VIT D 25 HYDROXY: 11.1 ng/mL — AB (ref 30.0–100.0)

## 2018-03-12 NOTE — Progress Notes (Signed)
.  Office: [phone]  /  Fax: [phone]   HPI:   Chief Complaint: OBESITY  Tyler Aguirre (MR# [id number]) is a 52 y.o. male who presents on 03/12/2018 for obesity evaluation and treatment. Current BMI is Body mass index is 41.5 kg/m.Tyler Aguirre has struggled with obesity for years and denies trying to lose weight in the past, but he has lost approximately 15 pounds stopping sodas. Tyler Aguirre eats out daly and sometimes for multiple meals and he states he has had to do this because of his work. He dislikes vegetables, and snacks at night on simple carbohydrates. Tyler Aguirre has a positive history of pseudotumor cerebri and cannot tolerate diamox. Tyler Aguirre attended our information session and states he is currently in the action stage of change and ready to dedicate time achieving and maintaining a healthier weight.  Tyler Aguirre states his desired weight loss is 74 lbs he has been heavy most of  his life his heaviest weight ever was 330 lbs. he is a picky eater and doesn't like to eat healthier foods  he has significant food cravings issues  he snacks frequently in the evenings he is frequently drinking liquids with calories he frequently makes poor food choices he frequently eats larger portions than normal  he has binge eating behaviors he struggles with emotional eating    Fatigue Tyler Aguirre feels his energy is lower than it should be. This has worsened with weight gain and has not worsened recently. Tyler Aguirre admits to daytime somnolence and admits to waking up still tired. Patient has a diagnosis of obstructive sleep apnea which may contribute to his fatigue. Patent has a history of symptoms of daytime fatigue, morning fatigue, morning headache and hypertension. Patient generally gets several hours of sleep per night, and states they generally have restless sleep. Snoring is present. Apneic episodes are not present. Epworth Sleepiness Score is 12  Dyspnea on exertion Tyler Aguirre notes increasing shortness of breath with  exercising and seems to be worsening over time with weight gain. He notes getting out of breath sooner with activity than he used to. This has not gotten worse recently. Tyler Aguirre denies orthopnea.  Congestive Heart Failure Tyler Aguirre has a diagnosis of congestive heart failure with LVH and diastolic dysfunction. His last echocardiogram was in 2017. He has had uncontrolled blood pressure and sleep apnea as well as obesity and diabetes, so he is at high risk of worsening CHF. He admits shortness of breath with exercise, but not at rest.  Sleep Apnea Tyler Aguirre has a diagnosis of sleep apnea and is not using his CPAP or mouthpiece. His sleep apnea is uncontrolled and he is positive for hypertension.  Hypertension Tyler Aguirre is a 52 y.o. male with hypertension. His blood pressure is elevated today at 159/76. He didn't take his medications this morning and he has had multiple elevated blood pressure readings in Epic. His sleep apnea is uncontrolled. Tyler Aguirre denies chest pain. He is working weight loss to help control his blood pressure with the goal of decreasing his risk of heart attack and stroke. Tyler Aguirre blood pressure is not currently controlled.  Diabetes II on insulin with complications Tyler Aguirre has a diagnosis of diabetes type II and is followed by endocrinologist Dr. [name]. He has had a history of not tolerating multiple medications due to various side effects. His glucose levels are uncontrolled. Average glucose is 238 and range from 70 to 350. Tyler Aguirre denies any hypoglycemic episodes. He has been working on intensive lifestyle modifications including diet, exercise, and  weight loss to help control his blood glucose levels.  At risk of deficient knowledge of diabetes Tyler Aguirre is at risk for deficient knowledge of diabetes.   Depression Screen Tyler Aguirre's Food and Mood (modified PHQ-9) score was  Depression screen PHQ 2/9 03/08/2018  Decreased Interest 1  Down, Depressed, Hopeless 1  PHQ - 2 Score 2  Altered  sleeping 1  Tired, decreased energy 2  Change in appetite 2  Feeling bad or failure about yourself  1  Trouble concentrating 1  Moving slowly or fidgety/restless 1  Suicidal thoughts 0  PHQ-9 Score 10    ALLERGIES: Allergies  Allergen Reactions  . Ibuprofen Other (See Comments)    Has to go to the bathroom all the time  . Metformin And Related Diarrhea    Unable to tolerate XL formulation.     MEDICATIONS: Current Outpatient Medications on File Prior to Visit  Medication Sig Dispense Refill  . Aurora Lancet Super Thin 30G MISC by Does not apply route. As direceted     . Continuous Blood Gluc Sensor (FREESTYLE LIBRE 14 DAY SENSOR) MISC 1 each by Does not apply route every 14 (fourteen) days. Change every 2 weeks 2 each 11  . glucose blood test strip 1 each by Other route as needed. Use as instructed 100 each 3  . Insulin Glargine (TOUJEO MAX SOLOSTAR) 300 UNIT/ML SOPN Inject 60 Units into the skin daily.    . Insulin Pen Needle (PEN NEEDLES 31GX5/16") 31G X 8 MM MISC Use 4x a day 100 each 3  . irbesartan (AVAPRO) 150 MG tablet Take 1 tablet (150 mg total) by mouth daily. 30 tablet 11  . NOVOLOG FLEXPEN 100 UNIT/ML FlexPen INJECT 40-50 UNITS 3 TIMES A DAY 45 pen 0   No current facility-administered medications on file prior to visit.     PAST MEDICAL HISTORY: Past Medical History:  Diagnosis Date  . ALLERGIC RHINITIS 03/12/2009  . DIABETES MELLITUS, TYPE II 02/26/2007  . 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 Flu 10/2017  . HYPERLIPIDEMIA 02/26/2007  . HYPERTENSION 02/26/2007  . NUMBNESS 04/17/2009  . Overweight(278.02) 02/26/2007  . Papilledema 10/2017  . PROSTATITIS, ACUTE 04/17/2009  . TRANSVERSE MYELITIS 05/01/2009  . Vision changes 10/2017    PAST SURGICAL HISTORY: Past Surgical History:  Procedure Laterality Date  . COLONOSCOPY    . DENTAL SURGERY    . POLYPECTOMY      SOCIAL HISTORY: Social History   Tobacco Use  . Smoking status: Never Smoker  . Smokeless tobacco:  Never Used  Substance Use Topics  . Alcohol use: Yes  . Drug use: No    FAMILY HISTORY: Family History  Problem Relation Age of Onset  . Heart disease Mother        valve dz  . Arthritis Mother        RA  . Anemia Mother   . Hypertension Father   . Heart disease Father   . Hyperlipidemia Father   . Heart disease Maternal Uncle        CAD  . Diabetes Paternal Grandmother   . Obesity Paternal Grandmother   . Hypertension Paternal Grandmother   . Cancer Paternal Grandfather        lung  . Mental retardation Paternal Grandfather        suicided  . Hypertension Paternal Grandfather   . Diabetes Paternal Grandfather   . Obesity Maternal Grandfather   . Hypertension Maternal Grandfather   . Stroke Paternal Uncle   .  Cancer Paternal Uncle     ROS: Review of Systems  Constitutional: Positive for malaise/fatigue.  HENT:       Hay Fever  Respiratory: Positive for shortness of breath (on exertion).   Cardiovascular: Negative for chest pain and orthopnea.       Negative for shortness of breath at rest  Neurological: Positive for headaches.  Endo/Heme/Allergies:       Positive for hyperglycemia Negative for hypoglycemia    PHYSICAL EXAM: Blood pressure (!) 159/76, pulse 84, temperature 98.3 F (36.8 C), temperature source Oral, height 6' (1.829 m), weight (!) 306 lb (138.8 kg), SpO2 96 %. Body mass index is 41.5 kg/m. Physical Exam  Constitutional: He is oriented to person, place, and time. He appears well-developed and well-nourished.  HENT:  Head: Normocephalic and atraumatic.  Nose: Nose normal.  Eyes: EOM are normal. No scleral icterus.  Neck: Normal range of motion. Neck supple. No thyromegaly present.  Cardiovascular: Normal rate.  Murmur heard.  Systolic (early) murmur is present with a grade of 2/6. Pulmonary/Chest: Effort normal. No respiratory distress.  Abdominal: Soft. There is no tenderness.  + obesity  Musculoskeletal: Normal range of motion.  Range  of motion normal in all 4 extremities  Neurological: He is alert and oriented to person, place, and time. Coordination normal.  Skin: Skin is warm and dry.  Psychiatric: He has a normal mood and affect. His behavior is normal.  Vitals reviewed.   RECENT LABS AND TESTS: BMET    Component Value Date/Time   NA 140 03/08/2018 0943   K 3.7 03/08/2018 0943   CL 105 03/08/2018 0943   CO2 21 03/08/2018 0943   GLUCOSE 106 (H) 03/08/2018 0943   GLUCOSE 288 (H) 10/27/2017 0314   BUN 13 03/08/2018 0943   CREATININE 0.69 (L) 03/08/2018 0943   CALCIUM 8.4 (L) 03/08/2018 0943   GFRNONAA 111 03/08/2018 0943   GFRAA 128 03/08/2018 0943   Lab Results  Component Value Date   HGBA1C 7.5 (H) 03/08/2018   Lab Results  Component Value Date   INSULIN 15.7 03/08/2018   CBC    Component Value Date/Time   WBC 6.2 03/08/2018 0943   WBC 7.1 10/27/2017 0314   RBC 4.20 03/08/2018 0943   RBC 4.11 (L) 10/27/2017 0314   HGB 13.2 03/08/2018 0943   HCT 39.7 03/08/2018 0943   PLT 121 (L) 03/08/2018 0943   MCV 95 03/08/2018 0943   MCH 31.4 03/08/2018 0943   MCH 31.9 10/27/2017 0314   MCHC 33.2 03/08/2018 0943   MCHC 33.8 10/27/2017 0314   RDW 14.9 03/08/2018 0943   LYMPHSABS 1.7 03/08/2018 0943   MONOABS 0.7 10/26/2017 1114   EOSABS 0.2 03/08/2018 0943   BASOSABS 0.0 03/08/2018 0943   Iron/TIBC/Ferritin/ %Sat No results found for: IRON, TIBC, FERRITIN, IRONPCTSAT Lipid Panel     Component Value Date/Time   CHOL 246 (H) 03/08/2018 0943   TRIG 142 03/08/2018 0943   TRIG 200 (H) 10/17/2006 1033   HDL 51 03/08/2018 0943   CHOLHDL 4.8 03/08/2018 0943   CHOLHDL 5 08/22/2016 0835   VLDL 18.4 08/22/2016 0835   LDLCALC 167 (H) 03/08/2018 0943   LDLDIRECT 147.8 01/22/2010 0851   Hepatic Function Panel     Component Value Date/Time   PROT 6.6 03/08/2018 0943   ALBUMIN 3.3 (L) 03/08/2018 0943   AST 89 (H) 03/08/2018 0943   ALT 62 (H) 03/08/2018 0943   ALKPHOS 149 (H) 03/08/2018 0943    BILITOT 1.5 (  H) 03/08/2018 0943   BILIDIR 0.2 08/22/2016 0835      Component Value Date/Time   TSH 4.110 03/08/2018 0943   Vitamin D There are no recent labs  INDIRECT CALORIMETER done today shows a VO2 of 317 and a REE of 2212. His calculated basal metabolic rate is 6659 thus his basal metabolic rate is worse than expected.    ASSESSMENT AND PLAN: Other fatigue - Plan: VITAMIN D 25 Hydroxy (Vit-D Deficiency, Fractures), CBC with Differential/Platelet, Comprehensive metabolic panel, Folate, T3, T4, free, TSH, Vitamin B12, VITAMIN D 25 Hydroxy (Vit-D Deficiency, Fractures), CANCELED: CBC with Differential/Platelet, CANCELED: Comprehensive metabolic panel, CANCELED: T3, CANCELED: T4, free, CANCELED: TSH, CANCELED: Vitamin B12, CANCELED: Folate  Shortness of breath on exertion - Plan: CANCELED: ECHOCARDIOGRAM COMPLETE  Controlled type 2 diabetes mellitus with other specified complication, without long-term current use of insulin (HCC) - Plan: Hemoglobin A1c, Insulin, random, CANCELED: Insulin, random, CANCELED: Hemoglobin A1c  Essential hypertension - Plan: Lipid panel, CANCELED: Lipid panel  Sleep apnea in adult  Chronic diastolic congestive heart failure (HCC) - Plan: CANCELED: ECHOCARDIOGRAM COMPLETE  Screening for depression  At risk for deficient knowledge of diabetes mellitus  Class 3 severe obesity with serious comorbidity and body mass index (BMI) of 40.0 to 44.9 in adult, unspecified obesity type (HCC)  PLAN:  Fatigue Tyler Aguirre was informed that his fatigue may be related to obesity, depression or many other causes. Labs will be ordered, and in the meanwhile Tyler Aguirre has agreed to work on diet, exercise and weight loss to help with fatigue. Proper sleep hygiene was discussed including the need for 7-8 hours of quality sleep each night. A sleep study was not ordered based on symptoms and Epworth score.  Dyspnea on exertion Tyler Aguirre shortness of breath appears to be obesity related  and exercise induced. He has agreed to work on weight loss and gradually increase exercise to treat his exercise induced shortness of breath. If Tyler Aguirre follows our instructions and loses weight without improvement of his shortness of breath, we will plan to refer to pulmonology. We will monitor this condition regularly. Tyler Aguirre agrees to this plan.  Congestive Heart Failure We will order echocardiogram ([facility]) approval #[id number](03/08/18-04/06/18 for echocardiogram.  Sleep Apnea Tyler Aguirre was strongly encouraged to start treatment for sleep apnea while starting weight loss to avoid worsening heart disease and other serious health problems. Tyler Aguirre agreed to this plan. He agrees to follow up with our clinic in 2 weeks.  Hypertension We discussed sodium restriction, working on healthy weight loss, and a regular exercise program as the means to achieve improved blood pressure control. Tyler Aguirre agreed with this plan and agreed to follow up as directed. We will recheck blood pressure in 2 weeks and we may need to adjust his medications if not at goal. We will continue to monitor his blood pressure as well as his progress with the above lifestyle modifications. He will continue his medications as prescribed and will watch for signs of hypotension as he continues his lifestyle modifications. We may need to adjust his medications if not at goal.  Diabetes II on insulin with complications Tyler Aguirre has been given extensive diabetes education by myself today including ideal fasting and post-prandial blood glucose readings, individual ideal Hgb A1c goals and hypoglycemia prevention. We discussed the importance of good blood sugar control to decrease the likelihood of diabetic complications such as nephropathy, neuropathy, limb loss, blindness, coronary artery disease, and death. We discussed the importance of intensive lifestyle modification including diet, exercise  and weight loss as the first line treatment for  diabetes. Tyler Aguirre agrees to continue to monitor his glucose readings. He was given extensive diabetes education, including using glucose tablets when his glucose drops. He agrees to start diet prescription and we will continue to monitor. Tyler Aguirre will continue his diabetes medications and will follow up at the agreed upon time.  Diabetes counseling Britney was given extended (15 minutes) diabetes counseling today. He is 52 y.o. male and has a diagnosis of diabetes including obesity. We discussed intensive lifestyle modifications today with an emphasis on weight loss as well as increasing exercise and decreasing simple carbohydrates in his diet.  Depression Screen Finnigan had a moderately positive depression screening. Depression is commonly associated with obesity and often results in emotional eating behaviors. We will monitor this closely and work on CBT to help improve the non-hunger eating patterns. Referral to Psychology may be required if no improvement is seen as he continues in our clinic.  Obesity Kylon is currently in the action stage of change and his goal is to continue with weight loss efforts He has agreed to follow the Category 4 plan. He refused some of the meal plan, so his meal plan had to be adjusted. Coleby has been instructed to work up to a goal of 150 minutes of combined cardio and strengthening exercise per week for weight loss and overall health benefits. We discussed the following Behavioral Modification Strategies today: increasing lean protein intake, decreasing simple carbohydrates  and work on meal planning and easy cooking plans  Brigham has agreed to follow up with our clinic in 2 weeks. He was informed of the importance of frequent follow up visits to maximize his success with intensive lifestyle modifications for his multiple health conditions. He was informed we would discuss his lab results at his next visit unless there is a critical issue that needs to be addressed sooner. Zaidan  agreed to keep his next visit at the agreed upon time to discuss these results.    OBESITY BEHAVIORAL INTERVENTION VISIT  Today's visit was # 1 out of 22.  Starting weight: 306 lbs Starting date: 03/08/18 Today's weight : 306 lbs  Today's date: 03/08/2018 Total lbs lost to date: 0 (Patients must lose 7 lbs in the first 6 months to continue with counseling)   ASK: We discussed the diagnosis of obesity with Tyler Aguirre today and Tyler Aguirre agreed to give [place] permission to discuss obesity behavioral modification therapy today.  ASSESS: Devlin has the diagnosis of obesity and his BMI today is 41.49 Buford is in the action stage of change   ADVISE: Estefano was educated on the multiple health risks of obesity as well as the benefit of weight loss to improve his health. He was advised of the need for long term treatment and the importance of lifestyle modifications.  AGREE: Multiple dietary modification options and treatment options were discussed and  Murad agreed to the above obesity treatment plan.   I, Doreene Nest, am acting as transcriptionist for Dennard Nip, MD   I have reviewed the above documentation for accuracy and completeness, and I agree with the above. -Dennard Nip, MD

## 2018-03-14 ENCOUNTER — Ambulatory Visit: Payer: BLUE CROSS/BLUE SHIELD | Admitting: Cardiology

## 2018-03-15 NOTE — Telephone Encounter (Addendum)
Sent to me in error. Phone staff routed to work in am [employer] fundraiserN, Katrina instead

## 2018-03-15 NOTE — Telephone Encounter (Signed)
Pt requesting a call to discuss his options, stating he has done some research and would like to move forward. Please call to advise. Pt aware RN is out of the office but will be sent to the work in nurse and that they will be in touch as soon as possible.

## 2018-03-15 NOTE — Telephone Encounter (Signed)
Rn call patient about some research he did on supplies  Pt stated he had a sleep test and it showed mild sleep apnea. Pt stated he was given choice of mouth piece or full mask. Pt stated he will be choosing the mouth piece. PT was told our office will be scheduling him with a dentist for the mouth piece. He did not know if our office was going to do a cardiology referral. He will call his PCP to see if it was there office.Pt stated this can wait till Dr. Valetta Moleohemeier comes back on Monday. Rn stated a message will be sent.

## 2018-03-19 ENCOUNTER — Other Ambulatory Visit: Payer: Self-pay | Admitting: Neurology

## 2018-03-19 DIAGNOSIS — G4730 Sleep apnea, unspecified: Secondary | ICD-10-CM

## 2018-03-19 NOTE — Telephone Encounter (Signed)
Called the pt, no answer. LVM for the pt informing him that I will send a referral to a dentist to get him set up with a dental device. Once referral is sent then he will hear from the dentist office to get him scheduled. I advised the pt to call back if he had any questions.

## 2018-03-21 ENCOUNTER — Telehealth: Payer: Self-pay | Admitting: Cardiovascular Disease

## 2018-03-21 NOTE — Telephone Encounter (Signed)
Spoke with Pt who wanted to know if he would have a stress test at his next appointment. Pt informed that there was no order at this time but he can discuss with Corine Shelter, PA the need for one. Verbalized understanding.

## 2018-03-21 NOTE — Telephone Encounter (Signed)
New Message   Pt states that he was suppose to be set up for a stress test but nothing in epic. Please  call

## 2018-03-22 ENCOUNTER — Encounter (INDEPENDENT_AMBULATORY_CARE_PROVIDER_SITE_OTHER): Payer: Self-pay | Admitting: Family Medicine

## 2018-03-22 ENCOUNTER — Ambulatory Visit (INDEPENDENT_AMBULATORY_CARE_PROVIDER_SITE_OTHER): Payer: BLUE CROSS/BLUE SHIELD | Admitting: Family Medicine

## 2018-03-22 VITALS — BP 170/74 | HR 75 | Temp 97.9°F | Ht 72.0 in | Wt 308.0 lb

## 2018-03-22 DIAGNOSIS — I10 Essential (primary) hypertension: Secondary | ICD-10-CM | POA: Diagnosis not present

## 2018-03-22 DIAGNOSIS — E559 Vitamin D deficiency, unspecified: Secondary | ICD-10-CM | POA: Diagnosis not present

## 2018-03-22 DIAGNOSIS — Z9189 Other specified personal risk factors, not elsewhere classified: Secondary | ICD-10-CM | POA: Diagnosis not present

## 2018-03-22 DIAGNOSIS — E7849 Other hyperlipidemia: Secondary | ICD-10-CM

## 2018-03-22 DIAGNOSIS — K760 Fatty (change of) liver, not elsewhere classified: Secondary | ICD-10-CM | POA: Diagnosis not present

## 2018-03-22 DIAGNOSIS — Z6841 Body Mass Index (BMI) 40.0 and over, adult: Secondary | ICD-10-CM | POA: Diagnosis not present

## 2018-03-22 DIAGNOSIS — E118 Type 2 diabetes mellitus with unspecified complications: Secondary | ICD-10-CM

## 2018-03-22 MED ORDER — VITAMIN D (ERGOCALCIFEROL) 1.25 MG (50000 UNIT) PO CAPS: 50000.0000 [IU] | ORAL_CAPSULE | ORAL | 0 refills | Status: DC

## 2018-03-22 MED ORDER — ATORVASTATIN CALCIUM 10 MG PO TABS: 10.0000 mg | ORAL_TABLET | Freq: Every day | ORAL | 0 refills | Status: DC

## 2018-03-27 NOTE — Progress Notes (Signed)
Office: [phone]  /  Fax: [phone]   HPI:   Chief Complaint: OBESITY Tyler Aguirre is here to discuss his progress with his obesity treatment plan. He is on the Category 4 plan and is following his eating plan approximately 30 % of the time. He states he is exercising 0 minutes 0 times per week. Tyler Aguirre struggled to follow the plan, but he had significant hunger, especially when he did not eat all his food. He had to travel unexpectedly, which made planing ahead difficult. He is ready to get back on track. His weight is (!) 308 lb (139.7 kg) today and has had a weight gain of 2 pounds over a period of 2 weeks since his last visit. He has gained 2 lbs since starting treatment with [place].  Vitamin D deficiency Tyler Aguirre has a diagnosis of vitamin D deficiency. His calcium is low. He is not currently taking vit D and admits fatigue but denies nausea, vomiting or muscle weakness.  Hyperlipidemia Tyler Aguirre has hyperlipidemia. He is not on statin any longer and is not sure why. His LDL is very high has been trying to improve his cholesterol levels with intensive lifestyle modification including a low saturated fat diet, exercise and weight loss. He denies any chest pain, claudication or myalgias.  Hypertension Tyler Aguirre is a 52 y.o. male with hypertension. His blood pressure is very high at 170/74 on medications. He has an appointment for a sleep study in 2 weeks. Tyler Aguirre denies chest pain or shortness of breath on exertion. He is working weight loss to help control his blood pressure with the goal of decreasing his risk of heart attack and stroke. Tyler Aguirre blood pressure is not currently controlled.  Diabetes II with serious complications on insulin Tyler Aguirre has a diagnosis of diabetes type II. Tyler Aguirre didn't bring in blood sugar log and he denies any hypoglycemic episodes. Last A1c was elevated at 7.5, on Toujeo and Novolog. He has been working on intensive lifestyle modifications including diet, exercise,  and weight loss to help control his blood glucose levels.  At risk for cardiovascular disease Tyler Aguirre is at a higher than average risk for cardiovascular disease due to obesity, hypertension, hyperlipidemia and diabetes. He currently denies any chest pain.  Non Alcoholic Fatty Liver Disease Tyler Aguirre has a diagnosis of elevated LFT. His BMI is over 40. He denies abdominal pain or jaundice and has prior diagnosis of fatty liver. He denies excessive alcohol intake.  ALLERGIES: Allergies  Allergen Reactions  . Ibuprofen Other (See Comments)    Has to go to the bathroom all the time  . Metformin And Related Diarrhea    Unable to tolerate XL formulation.     MEDICATIONS: Current Outpatient Medications on File Prior to Visit  Medication Sig Dispense Refill  . Aurora Lancet Super Thin 30G MISC by Does not apply route. As direceted     . Continuous Blood Gluc Sensor (FREESTYLE LIBRE 14 DAY SENSOR) MISC 1 each by Does not apply route every 14 (fourteen) days. Change every 2 weeks 2 each 11  . glucose blood test strip 1 each by Other route as needed. Use as instructed 100 each 3  . Insulin Glargine (TOUJEO MAX SOLOSTAR) 300 UNIT/ML SOPN Inject 60 Units into the skin daily.    . Insulin Pen Needle (PEN NEEDLES 31GX5/16") 31G X 8 MM MISC Use 4x a day 100 each 3  . irbesartan (AVAPRO) 150 MG tablet Take 1 tablet (150 mg total) by mouth daily. [place]  tablet 11  . NOVOLOG FLEXPEN 100 UNIT/ML FlexPen INJECT 40-50 UNITS 3 TIMES A DAY 45 pen 0   No current facility-administered medications on file prior to visit.     PAST MEDICAL HISTORY: Past Medical History:  Diagnosis Date  . ALLERGIC RHINITIS 03/12/2009  . DIABETES MELLITUS, TYPE II 02/26/2007  . [place] DISEASE, LUMBAR 06/16/2009  . Flu 10/2017  . HYPERLIPIDEMIA 02/26/2007  . HYPERTENSION 02/26/2007  . NUMBNESS 04/17/2009  . Overweight(278.02) 02/26/2007  . Papilledema 10/2017  . PROSTATITIS, ACUTE 04/17/2009  . TRANSVERSE MYELITIS 05/01/2009  . Vision changes  10/2017    PAST SURGICAL HISTORY: Past Surgical History:  Procedure Laterality Date  . COLONOSCOPY    . DENTAL SURGERY    . POLYPECTOMY      SOCIAL HISTORY: Social History   Tobacco Use  . Smoking status: Never Smoker  . Smokeless tobacco: Never Used  Substance Use Topics  . Alcohol use: Yes  . Drug use: No    FAMILY HISTORY: Family History  Problem Relation Age of Onset  . Heart disease Mother        valve dz  . Arthritis Mother        RA  . Anemia Mother   . Hypertension Father   . Heart disease Father   . Hyperlipidemia Father   . Heart disease Maternal Uncle        CAD  . Diabetes Paternal Grandmother   . Obesity Paternal Grandmother   . Hypertension Paternal Grandmother   . Cancer Paternal Grandfather        lung  . Mental retardation Paternal Grandfather        suicided  . Hypertension Paternal Grandfather   . Diabetes Paternal Grandfather   . Obesity Maternal Grandfather   . Hypertension Maternal Grandfather   . Stroke Paternal Uncle   . Cancer Paternal Uncle     ROS: Review of Systems  Constitutional: Positive for malaise/fatigue. Negative for weight loss.  Respiratory: Negative for shortness of breath (on exertion).   Cardiovascular: Negative for chest pain and claudication.  Gastrointestinal: Negative for abdominal pain, nausea and vomiting.  Musculoskeletal: Negative for myalgias.       Negative for muscle weakness  Skin:       Negative for jaundice  Endo/Heme/Allergies:       Negative for hypoglycemia    PHYSICAL EXAM: Blood pressure (!) 170/74, pulse 75, temperature 97.9 F (36.6 C), temperature source Oral, height 6' (1.829 m), weight (!) 308 lb (139.7 kg), SpO2 97 %. Body mass index is 41.77 kg/m. Physical Exam  Constitutional: He is oriented to person, place, and time. He appears well-developed and well-nourished.  Cardiovascular: Normal rate.  Pulmonary/Chest: Effort normal.  Musculoskeletal: Normal range of motion.    Neurological: He is oriented to person, place, and time.  Skin: Skin is warm and dry.  Psychiatric: He has a normal mood and affect. His behavior is normal.  Vitals reviewed.   RECENT LABS AND TESTS: BMET    Component Value Date/Time   NA 140 03/08/2018 0943   K 3.7 03/08/2018 0943   CL 105 03/08/2018 0943   CO2 21 03/08/2018 0943   GLUCOSE 106 (H) 03/08/2018 0943   GLUCOSE 288 (H) 10/27/2017 0314   BUN 13 03/08/2018 0943   CREATININE 0.69 (L) 03/08/2018 0943   CALCIUM 8.4 (L) 03/08/2018 0943   GFRNONAA 111 03/08/2018 0943   GFRAA 128 03/08/2018 0943   Lab Results  Component Value Date   HGBA1C  7.5 (H) 03/08/2018   HGBA1C 7.7 03/06/2018   HGBA1C 8 12/05/2017   HGBA1C 8.2 08/08/2017   HGBA1C 8.1 02/28/2017   Lab Results  Component Value Date   INSULIN 15.7 03/08/2018   CBC    Component Value Date/Time   WBC 6.2 03/08/2018 0943   WBC 7.1 10/27/2017 0314   RBC 4.20 03/08/2018 0943   RBC 4.11 (L) 10/27/2017 0314   HGB 13.2 03/08/2018 0943   HCT 39.7 03/08/2018 0943   PLT 121 (L) 03/08/2018 0943   MCV 95 03/08/2018 0943   MCH 31.4 03/08/2018 0943   MCH 31.9 10/27/2017 0314   MCHC 33.2 03/08/2018 0943   MCHC 33.8 10/27/2017 0314   RDW 14.9 03/08/2018 0943   LYMPHSABS 1.7 03/08/2018 0943   MONOABS 0.7 10/26/2017 1114   EOSABS 0.2 03/08/2018 0943   BASOSABS 0.0 03/08/2018 0943   Iron/TIBC/Ferritin/ %Sat No results found for: IRON, TIBC, FERRITIN, IRONPCTSAT Lipid Panel     Component Value Date/Time   CHOL 246 (H) 03/08/2018 0943   TRIG 142 03/08/2018 0943   TRIG 200 (H) 10/17/2006 1033   HDL 51 03/08/2018 0943   CHOLHDL 4.8 03/08/2018 0943   CHOLHDL 5 08/22/2016 0835   VLDL 18.4 08/22/2016 0835   LDLCALC 167 (H) 03/08/2018 0943   LDLDIRECT 147.8 01/22/2010 0851   Hepatic Function Panel     Component Value Date/Time   PROT 6.6 03/08/2018 0943   ALBUMIN 3.3 (L) 03/08/2018 0943   AST 89 (H) 03/08/2018 0943   ALT 62 (H) 03/08/2018 0943   ALKPHOS 149  (H) 03/08/2018 0943   BILITOT 1.5 (H) 03/08/2018 0943   BILIDIR 0.2 08/22/2016 0835      Component Value Date/Time   TSH 4.110 03/08/2018 0943   TSH 4.665 (H) 10/27/2017 0934   TSH 2.92 08/22/2016 0835   TSH 2.81 08/11/2015   TSH 2.99 01/22/2010 0851   Results for JONAEL, PARADISO MAC (MRN [id number]) as of 03/27/2018 14:18  Ref. Range 03/08/2018 10:30  Vitamin D, 25-Hydroxy Latest Ref Range: 30.0 - 100.0 ng/mL 11.1 (L)   ASSESSMENT AND PLAN: Type 2 diabetes mellitus with complication, without long-term current use of insulin (HCC)  NAFLD (nonalcoholic fatty liver disease)  Essential hypertension  Other hyperlipidemia - Plan: atorvastatin (LIPITOR) 10 MG tablet  Vitamin D deficiency - Plan: Vitamin D, Ergocalciferol, (DRISDOL) 50000 units CAPS capsule  At risk for heart disease  Class 3 severe obesity with serious comorbidity and body mass index (BMI) of 40.0 to 44.9 in adult, unspecified obesity type ([place])  PLAN:  Vitamin D Deficiency Tyler Aguirre was informed that low vitamin D levels contributes to fatigue and are associated with obesity, breast, and colon cancer. He agrees to start to take prescription Vit D @50 ,000 IU every week #4 with no refills and will follow up for routine testing of vitamin D, at least 2-3 times per year. He was informed of the risk of over-replacement of vitamin D and agrees to not increase his dose unless he discusses this with [place] first. Tyler Aguirre agrees to follow up with our clinic in 2 weeks.  Hyperlipidemia Tyler Aguirre was informed of the American Heart Association Guidelines emphasizing intensive lifestyle modifications as the first line treatment for hyperlipidemia. We discussed many lifestyle modifications today in depth, and Tyler Aguirre will continue to work on decreasing saturated fats such as fatty red meat, butter and many fried foods. He will also increase vegetables and lean protein in his diet and continue to work on exercise and weight  loss efforts. Tyler Aguirre agrees to start  Lipitor 10 mg qhs #30 with no refills and follow up as directed.  Hypertension We discussed sodium restriction, working on healthy weight loss, and a regular exercise program as the means to achieve improved blood pressure control. Tyler Aguirre agreed with this plan and agreed to follow up as directed. We will recheck blood pressure in 2 weeks and will start another medication if not improved at that time. We will continue to monitor his blood pressure as well as his progress with the above lifestyle modifications. He will continue Avapro and will watch for signs of hypotension as he continues his lifestyle modifications.  Diabetes II with serious complications on insulin Tyler Aguirre has been given extensive diabetes education by myself today including ideal fasting and post-prandial blood glucose readings, individual ideal Hgb A1c goals and hypoglycemia prevention. We discussed the importance of good blood sugar control to decrease the likelihood of diabetic complications such as nephropathy, neuropathy, limb loss, blindness, coronary artery disease, and death. We discussed the importance of intensive lifestyle modification including diet, exercise and weight loss as the first line treatment for diabetes. Tyler Aguirre agrees to check his blood sugar and get back to his diet prescription. He will continue his diabetes medications and will follow up with our clinic in 2 weeks.  Cardiovascular risk counseling Tyler Aguirre was given extended (30 minutes) coronary artery disease prevention counseling today. He is 52 y.o. male and has risk factors for heart disease including obesity, hypertension, hyperlipidemia and diabetes. We discussed intensive lifestyle modifications today with an emphasis on specific weight loss instructions and strategies. Pt was also informed of the importance of increasing exercise and decreasing saturated fats to help prevent heart disease.  Non Alcoholic Fatty Liver Disease We discussed the diagnosis of non  alcoholic fatty liver disease today and how this condition is obesity related. Tyler Aguirre was educated on his risk of developing NASH or even liver failure and th only proven treatment for NAFLD was weight loss. Tyler Aguirre agreed to continue with his weight loss efforts with healthier diet and exercise as an essential part of his treatment plan. We will recheck labs in 3 months and [place] agreed to follow up as directed.  Obesity Tyler Aguirre is currently in the action stage of change. As such, his goal is to continue with weight loss efforts He has agreed to follow the Category 4 plan Tyler Aguirre has been instructed to work up to a goal of 150 minutes of combined cardio and strengthening exercise per week for weight loss and overall health benefits. We discussed the following Behavioral Modification Strategies today: increasing lean protein intake, decreasing simple carbohydrates , decrease eating out, work on meal planning and easy cooking plans, travel eating strategies  and decrease liquid calories  Sarath has agreed to follow up with our clinic in 2 weeks. He was informed of the importance of frequent follow up visits to maximize his success with intensive lifestyle modifications for his multiple health conditions.   OBESITY BEHAVIORAL INTERVENTION VISIT  Today's visit was # 2 out of 22.  Starting weight: 306 lbs Starting date: 03/08/18 Today's weight : 308 lbs Today's date: 03/22/2018 Total lbs lost to date: 0 (Patients must lose 7 lbs in the first 6 months to continue with counseling)   ASK: We discussed the diagnosis of obesity with Tyler Aguirre today and Jenny Reichmann agreed to give [place] permission to discuss obesity behavioral modification therapy today.  ASSESS: Gemayel has the diagnosis of obesity and his BMI today  is 41.76 Antjuan is in the action stage of change   ADVISE: Aloysius was educated on the multiple health risks of obesity as well as the benefit of weight loss to improve his health. He was advised of the need  for long term treatment and the importance of lifestyle modifications.  AGREE: Multiple dietary modification options and treatment options were discussed and  Edmundo agreed to the above obesity treatment plan.  I, Doreene Nest, am acting as transcriptionist for Eber Jones, MD  I have reviewed the above documentation for accuracy and completeness, and I agree with the above. -Dennard Nip, MD

## 2018-04-10 ENCOUNTER — Encounter (INDEPENDENT_AMBULATORY_CARE_PROVIDER_SITE_OTHER): Payer: Self-pay

## 2018-04-10 ENCOUNTER — Ambulatory Visit (INDEPENDENT_AMBULATORY_CARE_PROVIDER_SITE_OTHER): Payer: BLUE CROSS/BLUE SHIELD | Admitting: Physician Assistant

## 2018-04-10 DIAGNOSIS — J343 Hypertrophy of nasal turbinates: Secondary | ICD-10-CM | POA: Diagnosis not present

## 2018-04-10 DIAGNOSIS — J310 Chronic rhinitis: Secondary | ICD-10-CM | POA: Diagnosis not present

## 2018-04-11 ENCOUNTER — Encounter: Payer: Self-pay | Admitting: Cardiology

## 2018-04-11 ENCOUNTER — Ambulatory Visit (INDEPENDENT_AMBULATORY_CARE_PROVIDER_SITE_OTHER): Payer: BLUE CROSS/BLUE SHIELD | Admitting: Cardiology

## 2018-04-11 ENCOUNTER — Encounter: Payer: Self-pay | Admitting: *Deleted

## 2018-04-11 VITALS — BP 156/72 | HR 79 | Ht 72.0 in | Wt 309.0 lb

## 2018-04-11 DIAGNOSIS — I10 Essential (primary) hypertension: Secondary | ICD-10-CM

## 2018-04-11 DIAGNOSIS — Z794 Long term (current) use of insulin: Secondary | ICD-10-CM | POA: Diagnosis not present

## 2018-04-11 DIAGNOSIS — E118 Type 2 diabetes mellitus with unspecified complications: Secondary | ICD-10-CM

## 2018-04-11 DIAGNOSIS — G4730 Sleep apnea, unspecified: Secondary | ICD-10-CM | POA: Insufficient documentation

## 2018-04-11 DIAGNOSIS — Z6841 Body Mass Index (BMI) 40.0 and over, adult: Secondary | ICD-10-CM | POA: Diagnosis not present

## 2018-04-11 DIAGNOSIS — R0602 Shortness of breath: Secondary | ICD-10-CM | POA: Diagnosis not present

## 2018-04-11 DIAGNOSIS — R0989 Other specified symptoms and signs involving the circulatory and respiratory systems: Secondary | ICD-10-CM | POA: Insufficient documentation

## 2018-04-11 DIAGNOSIS — E785 Hyperlipidemia, unspecified: Secondary | ICD-10-CM | POA: Diagnosis not present

## 2018-04-11 DIAGNOSIS — G4733 Obstructive sleep apnea (adult) (pediatric): Secondary | ICD-10-CM | POA: Diagnosis not present

## 2018-04-11 DIAGNOSIS — I5032 Chronic diastolic (congestive) heart failure: Secondary | ICD-10-CM | POA: Diagnosis not present

## 2018-04-11 DIAGNOSIS — R000 Tachycardia, unspecified: Secondary | ICD-10-CM | POA: Diagnosis not present

## 2018-04-11 MED ORDER — CARVEDILOL 3.125 MG PO TABS: 3.1250 mg | ORAL_TABLET | Freq: Two times a day (BID) | ORAL | 5 refills | Status: DC

## 2018-04-11 NOTE — Patient Instructions (Addendum)
Medication Instructions:  START CARVEDILOL 3.125 MG TWICE A DAY   Labwork: NONE  Testing/Procedures: Your physician has requested that you have an echocardiogram. Echocardiography is a painless test that uses sound waves to create images of your heart. It provides your doctor with information about the size and shape of your heart and how well your heart's chambers and valves are working. This procedure takes approximately one hour. There are no restrictions for this procedure. [facility] AT [address]  Your physician has requested that you have a carotid duplex. This test is an ultrasound of the carotid arteries in your neck. It looks at blood flow through these arteries that supply the brain with blood. Allow one hour for this exam. There are no restrictions or special instructions.  Follow-Up: Your physician recommends that you schedule a follow-up appointment in: 2-3 MONTHS   If you need a refill on your cardiac medications before your next appointment, please call your pharmacy.

## 2018-04-11 NOTE — Progress Notes (Signed)
Thank you, will wait for echo. MCr

## 2018-04-11 NOTE — Telephone Encounter (Signed)
This encounter was created in error - please disregard.

## 2018-04-11 NOTE — Addendum Note (Signed)
Addended by: Regis Bill B on: 04/11/2018 10:01 AM   Modules accepted: Orders

## 2018-04-11 NOTE — Assessment & Plan Note (Signed)
Bilateral carotid bruit on exam

## 2018-04-11 NOTE — Progress Notes (Signed)
04/11/2018 Tyler Aguirre   [birth year]  [id number]  Primary Physician Via, Lennette Bihari, MD Primary Cardiologist: Dr Sallyanne Kuster  HPI:  52 y/o obese male with a history of HTN, IDDM, HLD, sleep apnea, and diastolic CHF, seen in the office today with a history of recent tachycardia and SOB. He pt was seen by Dr Vonna Kotyk in 2017. He had a GXT and an echo then. He was able to exercise for over 9 minutes on the [place] protocol without angina or ECG changes. Echocardiogram confirmed expected findings of LVH and diastolic dysfunction showed evidence of elevated left atrial filling pressure. He was placed on a diuretic and then lost to follow up. Recently he has been working on weight loss. He has started exercising and two months ago had an episode of tachycardia associated with SOB and diaphoresis while doing light exercise. He thought he might "pass out". It passed after he sat down for a bit. He went to his PCP who did an EKG and referred him here. He has not had any further episodes. The pt does admit he may have taken OTC decongestants around that time, he had deviated septum repair in Feb 2019.   He has also had a sleep study and will be fitted for a mouthpiece. He saw a dietitian-Dr Beasly who did labs which revealed an LDL of 167 and elevated LFTs with an AST of 89 and a ALT of 62. The pt had been on Lipitor in the past and it was recommended she start Lipitor 10 mg daily. He has follow labs scheduled with dr Leafy Ro.      Current Outpatient Medications  Medication Sig Dispense Refill  . atorvastatin (LIPITOR) 10 MG tablet Take 1 tablet (10 mg total) by mouth daily. 30 tablet 0  . Aurora Lancet Super Thin 30G MISC by Does not apply route. As direceted     . Continuous Blood Gluc Sensor (FREESTYLE LIBRE 14 DAY SENSOR) MISC 1 each by Does not apply route every 14 (fourteen) days. Change every 2 weeks 2 each 11  . glucose blood test strip 1 each by Other route as needed. Use as instructed 100 each 3  .  Insulin Glargine (TOUJEO MAX SOLOSTAR) 300 UNIT/ML SOPN Inject 60 Units into the skin daily.    . Insulin Pen Needle (PEN NEEDLES 31GX5/16") 31G X 8 MM MISC Use 4x a day 100 each 3  . irbesartan (AVAPRO) 150 MG tablet Take 1 tablet (150 mg total) by mouth daily. 30 tablet 11  . NOVOLOG FLEXPEN 100 UNIT/ML FlexPen INJECT 40-50 UNITS 3 TIMES A DAY 45 pen 0  . Vitamin D, Ergocalciferol, (DRISDOL) 50000 units CAPS capsule Take 1 capsule (50,000 Units total) by mouth every 7 (seven) days. 4 capsule 0  . carvedilol (COREG) 3.125 MG tablet Take 1 tablet (3.125 mg total) by mouth 2 (two) times daily. 60 tablet 5   No current facility-administered medications for this visit.     Allergies  Allergen Reactions  . Ibuprofen Other (See Comments)    Has to go to the bathroom all the time  . Metformin And Related Diarrhea    Unable to tolerate XL formulation.     Past Medical History:  Diagnosis Date  . ALLERGIC RHINITIS 03/12/2009  . DIABETES MELLITUS, TYPE II 02/26/2007  . [place] DISEASE, LUMBAR 06/16/2009  . Flu 10/2017  . HYPERLIPIDEMIA 02/26/2007  . HYPERTENSION 02/26/2007  . NUMBNESS 04/17/2009  . Overweight(278.02) 02/26/2007  . Papilledema 10/2017  . PROSTATITIS, ACUTE  04/17/2009  . TRANSVERSE MYELITIS 05/01/2009  . Vision changes 10/2017    Social History   Socioeconomic History  . Marital status: Single    Spouse name: Not on file  . Number of children: Not on file  . Years of education: Not on file  . Highest education level: Not on file  Occupational History  . Not on file  Social Needs  . Financial resource strain: Not on file  . Food insecurity:    Worry: Not on file    Inability: Not on file  . Transportation needs:    Medical: Not on file    Non-medical: Not on file  Tobacco Use  . Smoking status: Never Smoker  . Smokeless tobacco: Never Used  Substance and Sexual Activity  . Alcohol use: Yes  . Drug use: No  . Sexual activity: Yes    Birth control/protection: Condom    Lifestyle  . Physical activity:    Days per week: Not on file    Minutes per session: Not on file  . Stress: Not on file  Relationships  . Social connections:    Talks on phone: Not on file    Gets together: Not on file    Attends religious service: Not on file    Active member of club or organization: Not on file    Attends meetings of clubs or organizations: Not on file    Relationship status: Not on file  . Intimate partner violence:    Fear of current or ex partner: Not on file    Emotionally abused: Not on file    Physically abused: Not on file    Forced sexual activity: Not on file  Other Topics Concern  . Not on file  Social History Narrative  . Not on file     Family History  Problem Relation Age of Onset  . Heart disease Mother        valve dz  . Arthritis Mother        RA  . Anemia Mother   . Hypertension Father   . Heart disease Father   . Hyperlipidemia Father   . Heart disease Maternal Uncle        CAD  . Diabetes Paternal Grandmother   . Obesity Paternal Grandmother   . Hypertension Paternal Grandmother   . Cancer Paternal Grandfather        lung  . Mental retardation Paternal Grandfather        suicided  . Hypertension Paternal Grandfather   . Diabetes Paternal Grandfather   . Obesity Maternal Grandfather   . Hypertension Maternal Grandfather   . Stroke Paternal Uncle   . Cancer Paternal Uncle      Review of Systems: General: negative for chills, fever, night sweats or weight changes.  Cardiovascular: negative for chest pain, dyspnea on exertion, edema, orthopnea, palpitations, paroxysmal nocturnal dyspnea or shortness of breath Dermatological: negative for rash Respiratory: negative for cough or wheezing Urologic: negative for hematuria Abdominal: negative for nausea, vomiting, diarrhea, bright red blood per rectum, melena, or hematemesis Neurologic: negative for visual changes, syncope, or dizziness All other systems reviewed and are  otherwise negative except as noted above.    Blood pressure (!) 156/72, pulse 79, height 6' (1.829 m), weight (!) 309 lb (140.2 kg).  General appearance: alert, cooperative, appears older than stated age, no distress and moderately obese Neck: no JVD and bilateral carotid artery bruits Lungs: clear to auscultation bilaterally Heart: regular rate and rhythm  Abdomen: soft, non-tender; bowel sounds normal; no masses,  no organomegaly Extremities: extremities normal, atraumatic, no cyanosis or edema Pulses: 2+ and symmetric Skin: Skin color, texture, turgor normal. No rashes or lesions Neurologic: Grossly normal  EKG NSR  ASSESSMENT AND PLAN:   Tachycardia Pt referred for an episode of tachycardia 2 months ago  Essential hypertension Sub optimal control  Dyslipidemia LDL 167- low dose Lipitor recently resumed (LFTs elevated)  Obesity BMI 41  Sleep apnea Recent sleep study- mouth guard recomended  Chronic diastolic heart failure (HCC) EF 60-65% with grade 2 DD in 2017 No CHF on exam- will repeat his echo to check LVF and LA size  Diabetes mellitus (HCC) IDDM- Hgb A1c- 7.5  Carotid artery bruit Bilateral carotid bruit on exam   PLAN  I suggested we add low dose Coreg to his current medications. I have ordered an echo and carotid dopplers. Dr Sallyanne Kuster will see him in 3 months. I'll defer f/u of his lipids and LFTs to Dr Leafy Ro as the patient tells me this is already arranged. If he has recurrent tachycardia I would place a monitor but he hasn't had any palpitations since that episode.   Kerin Ransom PA-C 04/11/2018 9:54 AM

## 2018-04-11 NOTE — Assessment & Plan Note (Signed)
IDDM- Hgb A1c- 7.5

## 2018-04-11 NOTE — Assessment & Plan Note (Signed)
Recent sleep study- mouth guard recomended

## 2018-04-11 NOTE — Assessment & Plan Note (Signed)
LDL 167- low dose Lipitor recently resumed (LFTs elevated)

## 2018-04-11 NOTE — Assessment & Plan Note (Signed)
BMI 41 

## 2018-04-11 NOTE — Assessment & Plan Note (Signed)
EF 60-65% with grade 2 DD in 2017 No CHF on exam- will repeat his echo to check LVF and LA size

## 2018-04-11 NOTE — Assessment & Plan Note (Signed)
Pt referred for an episode of tachycardia 2 months ago

## 2018-04-11 NOTE — Assessment & Plan Note (Signed)
Sub optimal control 

## 2018-04-16 ENCOUNTER — Other Ambulatory Visit (INDEPENDENT_AMBULATORY_CARE_PROVIDER_SITE_OTHER): Payer: Self-pay | Admitting: Family Medicine

## 2018-04-16 DIAGNOSIS — E559 Vitamin D deficiency, unspecified: Secondary | ICD-10-CM

## 2018-04-18 ENCOUNTER — Other Ambulatory Visit (INDEPENDENT_AMBULATORY_CARE_PROVIDER_SITE_OTHER): Payer: Self-pay | Admitting: Family Medicine

## 2018-04-18 DIAGNOSIS — E7849 Other hyperlipidemia: Secondary | ICD-10-CM

## 2018-04-19 ENCOUNTER — Ambulatory Visit (HOSPITAL_COMMUNITY)
Admission: RE | Admit: 2018-04-19 | Discharge: 2018-04-19 | Disposition: A | Discharge status: Home / Self Care | Payer: BLUE CROSS/BLUE SHIELD | Source: Ambulatory Visit | Attending: Cardiology | Admitting: Cardiology

## 2018-04-19 ENCOUNTER — Ambulatory Visit (HOSPITAL_BASED_OUTPATIENT_CLINIC_OR_DEPARTMENT_OTHER): Payer: BLUE CROSS/BLUE SHIELD

## 2018-04-19 ENCOUNTER — Other Ambulatory Visit: Payer: Self-pay

## 2018-04-19 DIAGNOSIS — I119 Hypertensive heart disease without heart failure: Secondary | ICD-10-CM | POA: Diagnosis not present

## 2018-04-19 DIAGNOSIS — I6523 Occlusion and stenosis of bilateral carotid arteries: Secondary | ICD-10-CM | POA: Insufficient documentation

## 2018-04-19 DIAGNOSIS — R0602 Shortness of breath: Secondary | ICD-10-CM | POA: Insufficient documentation

## 2018-04-19 DIAGNOSIS — R000 Tachycardia, unspecified: Secondary | ICD-10-CM | POA: Diagnosis not present

## 2018-04-19 DIAGNOSIS — E119 Type 2 diabetes mellitus without complications: Secondary | ICD-10-CM | POA: Insufficient documentation

## 2018-04-19 DIAGNOSIS — R0989 Other specified symptoms and signs involving the circulatory and respiratory systems: Secondary | ICD-10-CM | POA: Diagnosis not present

## 2018-04-20 ENCOUNTER — Other Ambulatory Visit (INDEPENDENT_AMBULATORY_CARE_PROVIDER_SITE_OTHER): Payer: Self-pay | Admitting: Family Medicine

## 2018-04-20 DIAGNOSIS — E559 Vitamin D deficiency, unspecified: Secondary | ICD-10-CM

## 2018-04-24 ENCOUNTER — Other Ambulatory Visit: Payer: Self-pay

## 2018-04-24 ENCOUNTER — Other Ambulatory Visit: Payer: Self-pay | Admitting: *Deleted

## 2018-04-24 MED ORDER — IRBESARTAN 150 MG PO TABS: 150.0000 mg | ORAL_TABLET | Freq: Every day | ORAL | 3 refills | Status: DC

## 2018-05-05 ENCOUNTER — Other Ambulatory Visit: Payer: Self-pay | Admitting: Internal Medicine

## 2018-05-10 ENCOUNTER — Ambulatory Visit (INDEPENDENT_AMBULATORY_CARE_PROVIDER_SITE_OTHER): Payer: BLUE CROSS/BLUE SHIELD | Admitting: Physician Assistant

## 2018-05-10 VITALS — BP 155/70 | HR 82 | Temp 98.7°F | Ht 72.0 in | Wt 304.0 lb

## 2018-05-10 DIAGNOSIS — E7849 Other hyperlipidemia: Secondary | ICD-10-CM

## 2018-05-10 DIAGNOSIS — Z9189 Other specified personal risk factors, not elsewhere classified: Secondary | ICD-10-CM

## 2018-05-10 DIAGNOSIS — E559 Vitamin D deficiency, unspecified: Secondary | ICD-10-CM | POA: Diagnosis not present

## 2018-05-10 DIAGNOSIS — Z6841 Body Mass Index (BMI) 40.0 and over, adult: Secondary | ICD-10-CM

## 2018-05-10 MED ORDER — ATORVASTATIN CALCIUM 10 MG PO TABS: 10.0000 mg | ORAL_TABLET | Freq: Every day | ORAL | 0 refills | Status: AC

## 2018-05-10 MED ORDER — VITAMIN D (ERGOCALCIFEROL) 1.25 MG (50000 UNIT) PO CAPS: 50000.0000 [IU] | ORAL_CAPSULE | ORAL | 0 refills | Status: DC

## 2018-05-10 NOTE — Progress Notes (Signed)
Office: [phone]  /  Fax: [phone]   HPI:   Chief Complaint: OBESITY Tyler Aguirre is here to discuss his progress with his obesity treatment plan. He is on the Category 4 plan and is following his eating plan approximately 80 % of the time. He states he is doing cardio for 60 minutes 5 times per week. Tyler Aguirre continues to do well with weight loss. He eats out most meals and is not getting the recommended protein. His weight is (!) 304 lb (137.9 kg) today and has had a weight loss of 4 pounds over a period of 7 weeks since his last visit. He has lost 2 lbs since starting treatment with [place].  Vitamin D deficiency Tyler Aguirre has a diagnosis of vitamin D deficiency. He is currently taking vit D and denies nausea, vomiting or muscle weakness.  Hyperlipidemia Tyler Aguirre has hyperlipidemia and has been trying to improve his cholesterol levels with intensive lifestyle modification including a low saturated fat diet, exercise and weight loss. He denies any chest pain, claudication or myalgias.  At risk for cardiovascular disease Tyler Aguirre is at a higher than average risk for cardiovascular disease due to obesity and hyperlipidemia. He currently denies any chest pain.  ALLERGIES: Allergies  Allergen Reactions  . Ibuprofen Other (See Comments)    Has to go to the bathroom all the time  . Metformin And Related Diarrhea    Unable to tolerate XL formulation.     MEDICATIONS: Current Outpatient Medications on File Prior to Visit  Medication Sig Dispense Refill  . atorvastatin (LIPITOR) 10 MG tablet Take 1 tablet (10 mg total) by mouth daily. 30 tablet 0  . Aurora Lancet Super Thin 30G MISC by Does not apply route. As direceted     . carvedilol (COREG) 3.125 MG tablet Take 1 tablet (3.125 mg total) by mouth 2 (two) times daily. 60 tablet 5  . Continuous Blood Gluc Sensor (FREESTYLE LIBRE 14 DAY SENSOR) MISC 1 each by Does not apply route every 14 (fourteen) days. Change every 2 weeks 2 each 11  . glucose blood  test strip 1 each by Other route as needed. Use as instructed 100 each 3  . Insulin Glargine (TOUJEO MAX SOLOSTAR) 300 UNIT/ML SOPN Inject 60 Units into the skin daily.    . Insulin Pen Needle (PEN NEEDLES 31GX5/16") 31G X 8 MM MISC Use 4x a day 100 each 3  . irbesartan (AVAPRO) 150 MG tablet Take 1 tablet (150 mg total) by mouth daily. 30 tablet 3  . NOVOLOG FLEXPEN 100 UNIT/ML FlexPen INJECT 40-50 UNITS 3 TIMES A DAY AS DIRECTED 135 pen 0  . Vitamin D, Ergocalciferol, (DRISDOL) 50000 units CAPS capsule Take 1 capsule (50,000 Units total) by mouth every 7 (seven) days. 4 capsule 0   No current facility-administered medications on file prior to visit.     PAST MEDICAL HISTORY: Past Medical History:  Diagnosis Date  . ALLERGIC RHINITIS 03/12/2009  . DIABETES MELLITUS, TYPE II 02/26/2007  . 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 Flu 10/2017  . HYPERLIPIDEMIA 02/26/2007  . HYPERTENSION 02/26/2007  . NUMBNESS 04/17/2009  . Overweight(278.02) 02/26/2007  . Papilledema 10/2017  . PROSTATITIS, ACUTE 04/17/2009  . TRANSVERSE MYELITIS 05/01/2009  . Vision changes 10/2017    PAST SURGICAL HISTORY: Past Surgical History:  Procedure Laterality Date  . COLONOSCOPY    . DENTAL SURGERY    . POLYPECTOMY      SOCIAL HISTORY: Social History   Tobacco Use  . Smoking status: Never  Smoker  . Smokeless tobacco: Never Used  Substance Use Topics  . Alcohol use: Yes  . Drug use: No    FAMILY HISTORY: Family History  Problem Relation Age of Onset  . Heart disease Mother        valve dz  . Arthritis Mother        RA  . Anemia Mother   . Hypertension Father   . Heart disease Father   . Hyperlipidemia Father   . Heart disease Maternal Uncle        CAD  . Diabetes Paternal Grandmother   . Obesity Paternal Grandmother   . Hypertension Paternal Grandmother   . Cancer Paternal Grandfather        lung  . Mental retardation Paternal Grandfather        suicided  . Hypertension Paternal Grandfather   .  Diabetes Paternal Grandfather   . Obesity Maternal Grandfather   . Hypertension Maternal Grandfather   . Stroke Paternal Uncle   . Cancer Paternal Uncle     ROS: Review of Systems  Constitutional: Positive for weight loss.  Cardiovascular: Negative for chest pain and claudication.  Gastrointestinal: Negative for nausea and vomiting.  Musculoskeletal: Negative for myalgias.       Negative for muscle weakness    PHYSICAL EXAM: Blood pressure (!) 155/70, pulse 82, temperature 98.7 F (37.1 C), temperature source Oral, height 6' (1.829 m), weight (!) 304 lb (137.9 kg), SpO2 98 %. Body mass index is 41.23 kg/m. Physical Exam  Constitutional: He is oriented to person, place, and time. He appears well-developed and well-nourished.  Cardiovascular: Normal rate.  Pulmonary/Chest: Effort normal.  Musculoskeletal: Normal range of motion.  Neurological: He is oriented to person, place, and time.  Skin: Skin is warm and dry.  Psychiatric: He has a normal mood and affect. His behavior is normal.  Vitals reviewed.   RECENT LABS AND TESTS: BMET    Component Value Date/Time   NA 140 03/08/2018 0943   K 3.7 03/08/2018 0943   CL 105 03/08/2018 0943   CO2 21 03/08/2018 0943   GLUCOSE 106 (H) 03/08/2018 0943   GLUCOSE 288 (H) 10/27/2017 0314   BUN 13 03/08/2018 0943   CREATININE 0.69 (L) 03/08/2018 0943   CALCIUM 8.4 (L) 03/08/2018 0943   GFRNONAA 111 03/08/2018 0943   GFRAA 128 03/08/2018 0943   Lab Results  Component Value Date   HGBA1C 7.5 (H) 03/08/2018   HGBA1C 7.7 03/06/2018   HGBA1C 8 12/05/2017   HGBA1C 8.2 08/08/2017   HGBA1C 8.1 02/28/2017   Lab Results  Component Value Date   INSULIN 15.7 03/08/2018   CBC    Component Value Date/Time   WBC 6.2 03/08/2018 0943   WBC 7.1 10/27/2017 0314   RBC 4.20 03/08/2018 0943   RBC 4.11 (L) 10/27/2017 0314   HGB 13.2 03/08/2018 0943   HCT 39.7 03/08/2018 0943   PLT 121 (L) 03/08/2018 0943   MCV 95 03/08/2018 0943   MCH  31.4 03/08/2018 0943   MCH 31.9 10/27/2017 0314   MCHC 33.2 03/08/2018 0943   MCHC 33.8 10/27/2017 0314   RDW 14.9 03/08/2018 0943   LYMPHSABS 1.7 03/08/2018 0943   MONOABS 0.7 10/26/2017 1114   EOSABS 0.2 03/08/2018 0943   BASOSABS 0.0 03/08/2018 0943   Iron/TIBC/Ferritin/ %Sat No results found for: IRON, TIBC, FERRITIN, IRONPCTSAT Lipid Panel     Component Value Date/Time   CHOL 246 (H) 03/08/2018 0943   TRIG 142 03/08/2018 0943  TRIG 200 (H) 10/17/2006 1033   HDL 51 03/08/2018 0943   CHOLHDL 4.8 03/08/2018 0943   CHOLHDL 5 08/22/2016 0835   VLDL 18.4 08/22/2016 0835   LDLCALC 167 (H) 03/08/2018 0943   LDLDIRECT 147.8 01/22/2010 0851   Hepatic Function Panel     Component Value Date/Time   PROT 6.6 03/08/2018 0943   ALBUMIN 3.3 (L) 03/08/2018 0943   AST 89 (H) 03/08/2018 0943   ALT 62 (H) 03/08/2018 0943   ALKPHOS 149 (H) 03/08/2018 0943   BILITOT 1.5 (H) 03/08/2018 0943   BILIDIR 0.2 08/22/2016 0835      Component Value Date/Time   TSH 4.110 03/08/2018 0943   TSH 4.665 (H) 10/27/2017 0934   TSH 2.92 08/22/2016 0835   TSH 2.81 08/11/2015   TSH 2.99 01/22/2010 0851   Results for TONY, GRANQUIST MAC (MRN [id number]) as of 05/10/2018 17:10  Ref. Range 03/08/2018 10:30  Vitamin D, 25-Hydroxy Latest Ref Range: 30.0 - 100.0 ng/mL 11.1 (L)   ASSESSMENT AND PLAN: Other hyperlipidemia - Plan: atorvastatin (LIPITOR) 10 MG tablet  Vitamin D deficiency - Plan: Vitamin D, Ergocalciferol, (DRISDOL) 50000 units CAPS capsule  At risk for heart disease  Class 3 severe obesity with serious comorbidity and body mass index (BMI) of 40.0 to 44.9 in adult, unspecified obesity type ([place])  PLAN:  Vitamin D Deficiency Tyler Aguirre was informed that low vitamin D levels contributes to fatigue and are associated with obesity, breast, and colon cancer. He agrees to continue to take prescription Vit D _0 ,000 IU every week #4 with no refills and will follow up for routine testing of vitamin D, at  least 2-3 times per year. He was informed of the risk of over-replacement of vitamin D and agrees to not increase his dose unless he discusses this with [place] first. Tyler Aguirre agrees to follow up as directed.  Hyperlipidemia Tyler Aguirre was informed of the American Heart Association Guidelines emphasizing intensive lifestyle modifications as the first line treatment for hyperlipidemia. We discussed many lifestyle modifications today in depth, and Barrie will continue to work on decreasing saturated fats such as fatty red meat, butter and many fried foods. He will also increase vegetables and lean protein in his diet and continue to work on exercise and weight loss efforts. Tyler Aguirre agrees to continue Lipitor 10 mg qd #30 with no refills and follow up as directed.  Cardiovascular risk counseling Tyler Aguirre was given extended (15 minutes) coronary artery disease prevention counseling today. He is 52 y.o. male and has risk factors for heart disease including obesity and hyperlipidemia. We discussed intensive lifestyle modifications today with an emphasis on specific weight loss instructions and strategies. Pt was also informed of the importance of increasing exercise and decreasing saturated fats to help prevent heart disease.  Obesity Tyler Aguirre is currently in the action stage of change. As such, his goal is to continue with weight loss efforts He has agreed to portion control better and make smarter food choices, such as increase vegetables and decrease simple carbohydrates  Tyler Aguirre has been instructed to work up to a goal of 150 minutes of combined cardio and strengthening exercise per week for weight loss and overall health benefits. We discussed the following Behavioral Modification Strategies today: decrease eating out and decrease junk food  Tyler Aguirre has agreed to follow up with our clinic in 2 weeks. He was informed of the importance of frequent follow up visits to maximize his success with intensive lifestyle modifications for his  multiple health conditions.  OBESITY BEHAVIORAL INTERVENTION VISIT  Today's visit was # 3 out of 22.  Starting weight: 306 lbs Starting date: 03/08/18 Today's weight : 304 lbs Today's date: 05/10/2018 Total lbs lost to date: 2 (Patients must lose 7 lbs in the first 6 months to continue with counseling)   ASK: We discussed the diagnosis of obesity with Tyler Aguirre today and Tyler Aguirre agreed to give [place] permission to discuss obesity behavioral modification therapy today.  ASSESS: Tyler Aguirre has the diagnosis of obesity and his BMI today is 41.22 Tyler Aguirre is in the action stage of change   ADVISE: Tyler Aguirre was educated on the multiple health risks of obesity as well as the benefit of weight loss to improve his health. He was advised of the need for long term treatment and the importance of lifestyle modifications.  AGREE: Multiple dietary modification options and treatment options were discussed and  Tyler Aguirre agreed to the above obesity treatment plan.   Corey Skains, am acting as transcriptionist for [employer], PA-C I, Lacy Duverney [facility], have reviewed this note and agree with its content

## 2018-05-29 ENCOUNTER — Ambulatory Visit (INDEPENDENT_AMBULATORY_CARE_PROVIDER_SITE_OTHER): Payer: BLUE CROSS/BLUE SHIELD | Admitting: Physician Assistant

## 2018-05-29 ENCOUNTER — Encounter (INDEPENDENT_AMBULATORY_CARE_PROVIDER_SITE_OTHER): Payer: Self-pay

## 2018-06-05 ENCOUNTER — Ambulatory Visit (INDEPENDENT_AMBULATORY_CARE_PROVIDER_SITE_OTHER): Payer: BLUE CROSS/BLUE SHIELD | Admitting: Internal Medicine

## 2018-06-05 ENCOUNTER — Encounter: Payer: Self-pay | Admitting: Internal Medicine

## 2018-06-05 VITALS — BP 144/70 | HR 69 | Ht 72.0 in | Wt 315.6 lb

## 2018-06-05 DIAGNOSIS — Z794 Long term (current) use of insulin: Secondary | ICD-10-CM

## 2018-06-05 DIAGNOSIS — Z6841 Body Mass Index (BMI) 40.0 and over, adult: Secondary | ICD-10-CM

## 2018-06-05 DIAGNOSIS — E785 Hyperlipidemia, unspecified: Secondary | ICD-10-CM | POA: Diagnosis not present

## 2018-06-05 DIAGNOSIS — E11319 Type 2 diabetes mellitus with unspecified diabetic retinopathy without macular edema: Secondary | ICD-10-CM | POA: Diagnosis not present

## 2018-06-05 LAB — POCT GLYCOSYLATED HEMOGLOBIN (HGB A1C): Hemoglobin A1C: 7.7 % — AB (ref 4.0–5.6)

## 2018-06-05 NOTE — Progress Notes (Signed)
Patient ID: Tyler Aguirre, male   DOB: [birth year], 52 y.o.   MRN: [id number]   HPI: Tyler Aguirre is a 52 y.o.-year-old male, returning for follow-up for DM2, dx in the 1990s, insulin-dependent, uncontrolled, with complications (DR). Last visit 3 months ago.  Last hemoglobin A1c was: Lab Results  Component Value Date   HGBA1C 7.5 (H) 03/08/2018   HGBA1C 7.7 03/06/2018   HGBA1C 8 12/05/2017   He is on: - Toujeo 80 units in a.m.  - Humalog 10-15 min  >> still not taking this before b'fast (but after), tries to inject it before the other meals - 40 units before a smaller meal - 50 units before a regular meal  - 60 units before a larger meal If you need to do a correction at bedtime, do not take >20-30 Units. He could not tolerate Trulicity 6.31 mg weekly  >> nausea. He had to stop Invokana in the past because of constipation. He stopped Metformin 2/2 GI intolerance.  He checks his sugars frequently with his freestyle libre CGM:  Ave 267+/-105 >> 234 +/- 97.5, coefficient of variation 39.3% >> 41.7% Time in range:  - low (<70): 2% >> 2% >> 2% - normal range (70-180): 19% >> 25% >> 30% - high sugars (>180): 79% >> 73% >> 68%  25-75% range:  - am:  120-200s>> 180-190s >> 150-255 >> 110-180 >> 154-200 - 2h after b'fast: n/c >> 200-260 >> 275-330 >> 210-320 - before lunch: 130-160 >> n/c >> 250-300 >> 280-350 >> 275->350 - 2h after lunch: 350 >> n/c >> >350 >> >325 >> 275->350 - before dinner:  180-200s >> 300->350 >> >300 >> 185->350 - 2h after dinner: 327, 333 >> n/c >> 190-330 >> >310  >> 225-300 - bedtime: n/c >> 150-210 >> 100 180 >> 200-290 - nighttime: n/c >> 60 at 12 am >> 100-220 >> 110-180 >> 51-143  Lowest sugar was 39 (woke him up) x1 - very active, small dinner >> ... >> 49 at 4 am >> 40 in a.m. >> 51; he has hypoglycemia awareness in the 80s. 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  He mentions that he cannot take food with him and he has to rely on fast food.  -No CKD, last BUN/creatinine:  Lab Results  Component Value Date   BUN 13 03/08/2018   BUN 10 10/27/2017   CREATININE 0.69 (L) 03/08/2018   CREATININE 0.75 10/27/2017  On Avapro.  Improved ACR: Lab Results  Component Value Date   MICRALBCREAT 1.9 08/22/2016   MICRALBCREAT 6.7 01/22/2010   MICRALBCREAT 4.1 09/02/2009   MICRALBCREAT 6.9 02/05/2009   MICRALBCREAT 13.9 11/30/2007   -+ HL; last set of lipids: Lab Results  Component Value Date   CHOL 246 (H) 03/08/2018   HDL 51 03/08/2018   LDLCALC 167 (H) 03/08/2018   LDLDIRECT 147.8 01/22/2010   TRIG 142 03/08/2018   CHOLHDL 4.8 03/08/2018  On Zocor. - last eye exam was in 007/2018: + DR -No numbness and tingling in his feet.  He had an episode of presyncope when exercising in the park in 2018. An EKG, exercise stress test, 2D Echo (Dr. Sallyanne Kuster) >> normal.  He recently saw neurology and was diagnosed with pseudotumor cerebri.  He was advised to start Diamox >> could  not start b/c dizziness..  ROS: Constitutional:+ weight gain/no weight loss, no fatigue, no subjective hyperthermia, no subjective hypothermia Eyes: no blurry vision, no xerophthalmia ENT: no sore throat, no nodules palpated in throat, no dysphagia, no odynophagia, no hoarseness Cardiovascular: no CP/no SOB/no palpitations/no leg swelling Respiratory: no cough/no SOB/no wheezing Gastrointestinal: no N/no V/no D/no C/no acid reflux Musculoskeletal: no muscle aches/no joint aches Skin: no rashes, no hair loss Neurological: no tremors/no numbness/no tingling/no dizziness  I reviewed pt's medications, allergies, PMH, social hx, family hx, and changes were documented in the history of present illness. Otherwise, unchanged from my initial visit note.  Past Medical History:  Diagnosis Date  . ALLERGIC RHINITIS  03/12/2009  . DIABETES MELLITUS, TYPE II 02/26/2007  . [place] DISEASE, LUMBAR 06/16/2009  . Flu 10/2017  . HYPERLIPIDEMIA 02/26/2007  . HYPERTENSION 02/26/2007  . NUMBNESS 04/17/2009  . Overweight(278.02) 02/26/2007  . Papilledema 10/2017  . PROSTATITIS, ACUTE 04/17/2009  . TRANSVERSE MYELITIS 05/01/2009  . Vision changes 10/2017   Past Surgical History:  Procedure Laterality Date  . COLONOSCOPY    . DENTAL SURGERY    . POLYPECTOMY     Social History   Social History  . Marital status: Single    Spouse name: N/A  . 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 Smoking status: Never Smoker  . Smokeless tobacco: Never Used  . Alcohol use Yes  . Drug use: No   Current Outpatient Medications on File Prior to Visit  Medication Sig Dispense Refill  . atorvastatin (LIPITOR) 10 MG tablet Take 1 tablet (10 mg total) by mouth daily. 30 tablet 0  . Aurora Lancet Super Thin 30G MISC by Does not apply route. As direceted     . carvedilol (COREG) 3.125 MG tablet Take 1 tablet (3.125 mg total) by mouth 2 (two) times daily. 60 tablet 5  . Continuous Blood Gluc Sensor (FREESTYLE LIBRE 14 DAY SENSOR) MISC 1 each by Does not apply route every 14 (fourteen) days. Change every 2 weeks 2 each 11  . glucose blood test strip 1 each by Other route as needed. Use as instructed 100 each 3  . Insulin Glargine (TOUJEO MAX SOLOSTAR) 300 UNIT/ML SOPN Inject 60 Units into the skin daily.    . Insulin Pen Needle (PEN NEEDLES 31GX5/16") 31G X 8 MM MISC Use 4x a day 100 each 3  . irbesartan (AVAPRO) 150 MG tablet Take 1 tablet (150 mg total) by mouth daily. 30 tablet 3  . NOVOLOG FLEXPEN 100 UNIT/ML FlexPen INJECT 40-50 UNITS 3 TIMES A DAY AS DIRECTED 135 pen 0  . Vitamin D, Ergocalciferol, (DRISDOL) 50000 units CAPS capsule Take 1 capsule (50,000 Units total) by mouth every 7 (seven) days. 4 capsule 0   No current facility-administered medications on file  prior to visit.    Allergies  Allergen Reactions  . Ibuprofen Other (See Comments)    Has to go to the bathroom all the time  . Metformin And Related Diarrhea    Unable to tolerate XL formulation.    Family History  Problem Relation Age of Onset  . Heart disease Mother        valve dz  . Arthritis Mother        RA  . Anemia Mother   . Hypertension Father   . Heart disease Father   . Hyperlipidemia Father   . Heart  disease Maternal Uncle        CAD  . Diabetes Paternal Grandmother   . Obesity Paternal Grandmother   . Hypertension Paternal Grandmother   . Cancer Paternal Grandfather        lung  . Mental retardation Paternal Grandfather        suicided  . Hypertension Paternal Grandfather   . Diabetes Paternal Grandfather   . Obesity Maternal Grandfather   . Hypertension Maternal Grandfather   . Stroke Paternal Uncle   . Cancer Paternal Uncle    PE: BP (!) 144/70   Pulse 69   Ht 6' (1.829 m)   Wt (!) 315 lb 9.6 oz (143.2 kg)   SpO2 96%   BMI 42.80 kg/m  Wt Readings from Last 3 Encounters:  06/05/18 (!) 315 lb 9.6 oz (143.2 kg)  05/10/18 (!) 304 lb (137.9 kg)  04/11/18 (!) 309 lb (140.2 kg)   Constitutional: overweight, in NAD Eyes: PERRLA, EOMI, no exophthalmos ENT: moist mucous membranes, no thyromegaly, no cervical lymphadenopathy Cardiovascular: RRR, No MRG Respiratory: CTA B Gastrointestinal: abdomen soft, NT, ND, BS+ Musculoskeletal: no deformities, strength intact in all 4 Skin: moist, warm, no rashes Neurological: no tremor with outstretched hands, DTR normal in all 4  ASSESSMENT: 1. DM2, insulin-dependent, uncontrolled, with complications - DR  2. Obesity class 3  PLAN:  1. Patient with long-standing, uncontrolled, type 2 diabetes, on basal-bolus insulin regimen, with high sugars after breakfast at last visit due to not taking insulin without meal.  He had smaller fluctuations after lunch and dinner, with a precipitous decrease in blood sugars  after bedtime.  He was taking 40 units of Humalog correction at bedtime at that time and had very low blood sugars in the morning between 40s and 60s - I strongly advised him to stop doing so.  We also increased Toujeo at last visit. - At this visit, he is CGM downloads appear approximately the same as before, with very high sugars after breakfast as he is still not injecting Humalog before the meal but only after and with precipitous drop of blood sugars after dinner with lows in the middle of the night - Again discussed about the importance of taking insulin as prescribed before meals.  This is paramount forgetting the sugars after breakfast under control.  I also advised him to use the 60 units of Humalog, rather than the 40, which he is using now.  Also, at bedtime, I hope that he will not need as much Humalog for correction if the sugars are not very high during the day. - He may require U500 insulin soon if he does not lose a significant amount of weight and decrease his insulin resistance. - I suggested to:  Patient Instructions  Please continue: - Toujeo 80 units in a.m. - Humalog 10-15 min before a meal. - 40 units before a smaller meal - 50 units before a regular meal  - 60 units before a larger meal (use this for b'fast) If you need to do a correction at bedtime, do not take >20-30 Units.  Please return in 3 months with your sugar log.   - today, HbA1c is 7.7% (higher) - continue checking sugars at different times of the day - check 3x a day, rotating checks - advised for yearly eye exams >> he is UTD - Return to clinic in 3 mo with sugar log      2. Obesity class 3 -Gained weight since last visit -Followed in the  weight loss clinic -SGLT2 inhibitors and GLP-1 receptor agonist caused side effects in the past: Constipation and nausea, respectively. However, at last visit, he was telling me that he is open to use this in the future, if needed  3. HL - Reviewed latest lipid panel:  LDL very high Lab Results  Component Value Date   CHOL 246 (H) 03/08/2018   HDL 51 03/08/2018   LDLCALC 167 (H) 03/08/2018   LDLDIRECT 147.8 01/22/2010   TRIG 142 03/08/2018   CHOLHDL 4.8 03/08/2018  - Changed from Zocor to Lipitor -  No side effects. - Discussed about the importance of reducing fatty his diet, starting with breakfast  Philemon Kingdom, MD PhD [facility] End[facility]

## 2018-06-05 NOTE — Patient Instructions (Signed)
Please continue: - Toujeo 80 units in a.m. - Humalog 10-15 min before a meal. - 40 units before a smaller meal - 50 units before a regular meal  - 60 units before a larger meal (use this for b'fast) If you need to do a correction at bedtime, do not take >20-30 Units.  Please return in 3 months with your sugar log.

## 2018-06-07 DIAGNOSIS — S86911A Strain of unspecified muscle(s) and tendon(s) at lower leg level, right leg, initial encounter: Secondary | ICD-10-CM | POA: Diagnosis not present

## 2018-06-12 ENCOUNTER — Encounter (INDEPENDENT_AMBULATORY_CARE_PROVIDER_SITE_OTHER): Payer: Self-pay | Admitting: Family Medicine

## 2018-06-12 ENCOUNTER — Ambulatory Visit (INDEPENDENT_AMBULATORY_CARE_PROVIDER_SITE_OTHER): Payer: BLUE CROSS/BLUE SHIELD | Admitting: Family Medicine

## 2018-06-12 VITALS — BP 172/73 | HR 56 | Temp 97.9°F | Wt 312.0 lb

## 2018-06-12 DIAGNOSIS — Z794 Long term (current) use of insulin: Secondary | ICD-10-CM

## 2018-06-12 DIAGNOSIS — E559 Vitamin D deficiency, unspecified: Secondary | ICD-10-CM

## 2018-06-12 DIAGNOSIS — Z9189 Other specified personal risk factors, not elsewhere classified: Secondary | ICD-10-CM | POA: Diagnosis not present

## 2018-06-12 DIAGNOSIS — I10 Essential (primary) hypertension: Secondary | ICD-10-CM

## 2018-06-12 DIAGNOSIS — E119 Type 2 diabetes mellitus without complications: Secondary | ICD-10-CM | POA: Diagnosis not present

## 2018-06-12 DIAGNOSIS — Z6841 Body Mass Index (BMI) 40.0 and over, adult: Secondary | ICD-10-CM

## 2018-06-12 MED ORDER — VITAMIN D (ERGOCALCIFEROL) 1.25 MG (50000 UNIT) PO CAPS: 50000.0000 [IU] | ORAL_CAPSULE | ORAL | 0 refills | Status: AC

## 2018-06-13 NOTE — Progress Notes (Signed)
Office: [phone]  /  Fax: [phone]   HPI:   Chief Complaint: OBESITY Tyler Aguirre is here to discuss his progress with his obesity treatment plan. He is on the portion control better and make smarter food choices plan and is following his eating plan approximately 50 % of the time. He states he is walking, swimming and exercising at the gym 60 minutes 5 times per week. Tyler Aguirre is not feeling well for the past few days. He recently returned from vacation and was mindful of portion sizes. His weight is (!) 312 lb (141.5 kg) today and has had a weight gain of 8 pounds over a period of 4 weeks since his last visit. He has gained 6 lbs since starting treatment with [place].  Vitamin D deficiency Tyler Aguirre has a diagnosis of vitamin D deficiency. He is currently taking vit D and admits fatigue, but denies nausea, vomiting or muscle weakness.  At risk for osteopenia and osteoporosis Tyler Aguirre is at higher risk of osteopenia and osteoporosis due to vitamin D deficiency.   Hypertension Tyler Aguirre is a 52 y.o. male with hypertension. His blood pressure is elevated. [name] denies chest pain, chest pressure or headache, but patient has had a head cold. He is working weight loss to help control his blood pressure with the goal of decreasing his risk of heart attack and stroke. Tyler Aguirre blood pressure is uncontrolled today.  Diabetes II Tyler Aguirre has a diagnosis of diabetes type II. His blood sugar is elevated secondary to vacation. Tyler Aguirre did not bring his blood sugar log to the office today. Tyler Aguirre sees Dr. [name].He denies any hypoglycemic episodes. Last A1c was at 7.7 He has been working on intensive lifestyle modifications including diet, exercise, and weight loss to help control his blood glucose levels.  ALLERGIES: Allergies  Allergen Reactions  . Ibuprofen Other (See Comments)    Has to go to the bathroom all the time  . Metformin And Related Diarrhea    Unable to tolerate XL formulation.      MEDICATIONS: Current Outpatient Medications on File Prior to Visit  Medication Sig Dispense Refill  . atorvastatin (LIPITOR) 10 MG tablet Take 1 tablet (10 mg total) by mouth daily. 30 tablet 0  . Aurora Lancet Super Thin 30G MISC by Does not apply route. As direceted     . carvedilol (COREG) 3.125 MG tablet Take 1 tablet (3.125 mg total) by mouth 2 (two) times daily. 60 tablet 5  . Continuous Blood Gluc Sensor (FREESTYLE LIBRE 14 DAY SENSOR) MISC 1 each by Does not apply route every 14 (fourteen) days. Change every 2 weeks 2 each 11  . glucose blood test strip 1 each by Other route as needed. Use as instructed 100 each 3  . Insulin Glargine (TOUJEO MAX SOLOSTAR) 300 UNIT/ML SOPN Inject 60 Units into the skin daily.    . Insulin Pen Needle (PEN NEEDLES 31GX5/16") 31G X 8 MM MISC Use 4x a day 100 each 3  . irbesartan (AVAPRO) 150 MG tablet Take 1 tablet (150 mg total) by mouth daily. 30 tablet 3  . NOVOLOG FLEXPEN 100 UNIT/ML FlexPen INJECT 40-50 UNITS 3 TIMES A DAY AS DIRECTED 135 pen 0   No current facility-administered medications on file prior to visit.     PAST MEDICAL HISTORY: Past Medical History:  Diagnosis Date  . ALLERGIC RHINITIS 03/12/2009  . DIABETES MELLITUS, TYPE II 02/26/2007  . [place] DISEASE, LUMBAR 06/16/2009  . Flu 10/2017  . HYPERLIPIDEMIA 02/26/2007  .  HYPERTENSION 02/26/2007  . NUMBNESS 04/17/2009  . Overweight(278.02) 02/26/2007  . Papilledema 10/2017  . PROSTATITIS, ACUTE 04/17/2009  . TRANSVERSE MYELITIS 05/01/2009  . Vision changes 10/2017    PAST SURGICAL HISTORY: Past Surgical History:  Procedure Laterality Date  . COLONOSCOPY    . DENTAL SURGERY    . POLYPECTOMY      SOCIAL HISTORY: Social History   Tobacco Use  . Smoking status: Never Smoker  . Smokeless tobacco: Never Used  Substance Use Topics  . Alcohol use: Yes  . Drug use: No    FAMILY HISTORY: Family History  Problem Relation Age of Onset  . Heart disease Mother        valve dz  .  Arthritis Mother        RA  . Anemia Mother   . Hypertension Father   . Heart disease Father   . Hyperlipidemia Father   . Heart disease Maternal Uncle        CAD  . Diabetes Paternal Grandmother   . Obesity Paternal Grandmother   . Hypertension Paternal Grandmother   . Cancer Paternal Grandfather        lung  . Mental retardation Paternal Grandfather        suicided  . Hypertension Paternal Grandfather   . Diabetes Paternal Grandfather   . Obesity Maternal Grandfather   . Hypertension Maternal Grandfather   . Stroke Paternal Uncle   . Cancer Paternal Uncle     ROS: Review of Systems  Constitutional: Positive for malaise/fatigue. Negative for weight loss.  Cardiovascular: Negative for chest pain.       Negative for chest pressure  Gastrointestinal: Negative for nausea and vomiting.  Musculoskeletal:       Negative for muscle weakness  Neurological: Negative for headaches.    PHYSICAL EXAM: Blood pressure (!) 172/73, pulse (!) 56, temperature 97.9 F (36.6 C), temperature source Oral, weight (!) 312 lb (141.5 kg), SpO2 97 %. Body mass index is 42.31 kg/m. Physical Exam  Constitutional: He is oriented to person, place, and time. He appears well-developed and well-nourished.  Cardiovascular: Normal rate.  Pulmonary/Chest: Effort normal.  Musculoskeletal: Normal range of motion.  Neurological: He is oriented to person, place, and time.  Skin: Skin is warm and dry.  Psychiatric: He has a normal mood and affect. His behavior is normal.  Vitals reviewed.   RECENT LABS AND TESTS: BMET    Component Value Date/Time   NA 140 03/08/2018 0943   K 3.7 03/08/2018 0943   CL 105 03/08/2018 0943   CO2 21 03/08/2018 0943   GLUCOSE 106 (H) 03/08/2018 0943   GLUCOSE 288 (H) 10/27/2017 0314   BUN 13 03/08/2018 0943   CREATININE 0.69 (L) 03/08/2018 0943   CALCIUM 8.4 (L) 03/08/2018 0943   GFRNONAA 111 03/08/2018 0943   GFRAA 128 03/08/2018 0943   Lab Results  Component  Value Date   HGBA1C 7.7 (A) 06/05/2018   HGBA1C 7.5 (H) 03/08/2018   HGBA1C 7.7 03/06/2018   HGBA1C 8 12/05/2017   HGBA1C 8.2 08/08/2017   Lab Results  Component Value Date   INSULIN 15.7 03/08/2018   CBC    Component Value Date/Time   WBC 6.2 03/08/2018 0943   WBC 7.1 10/27/2017 0314   RBC 4.20 03/08/2018 0943   RBC 4.11 (L) 10/27/2017 0314   HGB 13.2 03/08/2018 0943   HCT 39.7 03/08/2018 0943   PLT 121 (L) 03/08/2018 0943   MCV 95 03/08/2018 0943   MCH  31.4 03/08/2018 0943   MCH 31.9 10/27/2017 0314   MCHC 33.2 03/08/2018 0943   MCHC 33.8 10/27/2017 0314   RDW 14.9 03/08/2018 0943   LYMPHSABS 1.7 03/08/2018 0943   MONOABS 0.7 10/26/2017 1114   EOSABS 0.2 03/08/2018 0943   BASOSABS 0.0 03/08/2018 0943   Iron/TIBC/Ferritin/ %Sat No results found for: IRON, TIBC, FERRITIN, IRONPCTSAT Lipid Panel     Component Value Date/Time   CHOL 246 (H) 03/08/2018 0943   TRIG 142 03/08/2018 0943   TRIG 200 (H) 10/17/2006 1033   HDL 51 03/08/2018 0943   CHOLHDL 4.8 03/08/2018 0943   CHOLHDL 5 08/22/2016 0835   VLDL 18.4 08/22/2016 0835   LDLCALC 167 (H) 03/08/2018 0943   LDLDIRECT 147.8 01/22/2010 0851   Hepatic Function Panel     Component Value Date/Time   PROT 6.6 03/08/2018 0943   ALBUMIN 3.3 (L) 03/08/2018 0943   AST 89 (H) 03/08/2018 0943   ALT 62 (H) 03/08/2018 0943   ALKPHOS 149 (H) 03/08/2018 0943   BILITOT 1.5 (H) 03/08/2018 0943   BILIDIR 0.2 08/22/2016 0835      Component Value Date/Time   TSH 4.110 03/08/2018 0943   TSH 4.665 (H) 10/27/2017 0934   TSH 2.92 08/22/2016 0835   TSH 2.81 08/11/2015   TSH 2.99 01/22/2010 0851   Results for DOWELL, HOON MAC (MRN [id number]) as of 06/13/2018 09:45  Ref. Range 03/08/2018 10:30  Vitamin D, 25-Hydroxy Latest Ref Range: 30.0 - 100.0 ng/mL 11.1 (L)   ASSESSMENT AND PLAN: Essential hypertension  Vitamin D deficiency - Plan: Vitamin D, Ergocalciferol, (DRISDOL) 50000 units CAPS capsule  Type 2 diabetes mellitus  without complication, with long-term current use of insulin (HCC)  At risk for osteoporosis  Class 3 severe obesity with serious comorbidity and body mass index (BMI) of 40.0 to 44.9 in adult, unspecified obesity type ([place])  PLAN:  Vitamin D Deficiency Drako was informed that low vitamin D levels contributes to fatigue and are associated with obesity, breast, and colon cancer. He agrees to continue to take prescription Vit D _0 ,000 IU every week #4 with no refills and will follow up for routine testing of vitamin D, at least 2-3 times per year. He was informed of the risk of over-replacement of vitamin D and agrees to not increase his dose unless he discusses this with [place] first. Chazz agrees to follow up as directed.  At risk for osteopenia and osteoporosis Pat is at risk for osteopenia and osteoporosis due to his vitamin D deficiency. He was encouraged to take his vitamin D and follow his higher calcium diet and increase strengthening exercise to help strengthen his bones and decrease his risk of osteopenia and osteoporosis.  Hypertension We discussed sodium restriction, working on healthy weight loss, and a regular exercise program as the means to achieve improved blood pressure control. Aleksander agreed with this plan and agreed to follow up as directed. We will follow up at the next visit. Previously blood pressure was controlled. We will continue to monitor his blood pressure as well as his progress with the above lifestyle modifications. He will continue his medications as prescribed and will watch for signs of hypotension as he continues his lifestyle modifications.  Diabetes II Christopherjohn has been given extensive diabetes education by myself today including ideal fasting and post-prandial blood glucose readings, individual ideal Hgb A1c goals  and hypoglycemia prevention. We discussed the importance of good blood sugar control to decrease the likelihood of diabetic complications such as  nephropathy,  neuropathy, limb loss, blindness, coronary artery disease, and death. We discussed the importance of intensive lifestyle modification including diet, exercise and weight loss as the first line treatment for diabetes. Anurag agrees to continue checking his blood sugar and bring blood sugar log to the next visit. He will continue his diabetes medications and will follow up at the agreed upon time.  Obesity Temitope is currently in the action stage of change. As such, his goal is to continue with weight loss efforts He has agreed to keep a food journal with 1750 to 1900 calories and 100+ grams of protein daily and follow the Category 4 plan Yazen has been instructed to work up to a goal of 150 minutes of combined cardio and strengthening exercise per week for weight loss and overall health benefits. We discussed the following Behavioral Modification Strategies today: better snacking choices, planning for success, increasing lean protein intake, increasing vegetables and work on meal planning and easy cooking plans  Randie has agreed to follow up with our clinic in 2 weeks. He was informed of the importance of frequent follow up visits to maximize his success with intensive lifestyle modifications for his multiple health conditions.   OBESITY BEHAVIORAL INTERVENTION VISIT  Today's visit was # 4 out of 22.  Starting weight: 306 lbs Starting date: 03/08/18 Today's weight : 312 lbs Today's date: 06/12/2018 Total lbs lost to date: 0    ASK: We discussed the diagnosis of obesity with [name] today and Jenny Reichmann agreed to give [place] permission to discuss obesity behavioral modification therapy today.  ASSESS: Vue has the diagnosis of obesity and his BMI today is 42.31 Bernis is in the action stage of change   ADVISE: Kouper was educated on the multiple health risks of obesity as well as the benefit of weight loss to improve his health. He was advised of the need for long term treatment and the importance of  lifestyle modifications.  AGREE: Multiple dietary modification options and treatment options were discussed and  Rahm agreed to the above obesity treatment plan.  I, Doreene Nest, am acting as transcriptionist for Eber Jones, MD  I have reviewed the above documentation for accuracy and completeness, and I agree with the above. - Ilene Qua, MD

## 2018-06-28 ENCOUNTER — Encounter (INDEPENDENT_AMBULATORY_CARE_PROVIDER_SITE_OTHER): Payer: Self-pay

## 2018-06-28 ENCOUNTER — Ambulatory Visit (INDEPENDENT_AMBULATORY_CARE_PROVIDER_SITE_OTHER): Payer: Self-pay | Admitting: Physician Assistant

## 2018-07-02 DIAGNOSIS — E113293 Type 2 diabetes mellitus with mild nonproliferative diabetic retinopathy without macular edema, bilateral: Secondary | ICD-10-CM | POA: Diagnosis not present

## 2018-07-02 DIAGNOSIS — H47093 Other disorders of optic nerve, not elsewhere classified, bilateral: Secondary | ICD-10-CM | POA: Diagnosis not present

## 2018-07-09 DIAGNOSIS — J019 Acute sinusitis, unspecified: Secondary | ICD-10-CM | POA: Diagnosis not present

## 2018-07-10 DIAGNOSIS — Z86010 Personal history of colonic polyps: Secondary | ICD-10-CM | POA: Diagnosis not present

## 2018-07-12 ENCOUNTER — Encounter: Payer: Self-pay | Admitting: Cardiovascular Disease

## 2018-07-12 ENCOUNTER — Ambulatory Visit (INDEPENDENT_AMBULATORY_CARE_PROVIDER_SITE_OTHER): Payer: BLUE CROSS/BLUE SHIELD | Admitting: Cardiovascular Disease

## 2018-07-12 VITALS — BP 160/70 | HR 70 | Ht 72.0 in | Wt 310.0 lb

## 2018-07-12 DIAGNOSIS — I5032 Chronic diastolic (congestive) heart failure: Secondary | ICD-10-CM | POA: Diagnosis not present

## 2018-07-12 DIAGNOSIS — E669 Obesity, unspecified: Secondary | ICD-10-CM

## 2018-07-12 DIAGNOSIS — E785 Hyperlipidemia, unspecified: Secondary | ICD-10-CM

## 2018-07-12 DIAGNOSIS — E1169 Type 2 diabetes mellitus with other specified complication: Secondary | ICD-10-CM

## 2018-07-12 DIAGNOSIS — I10 Essential (primary) hypertension: Secondary | ICD-10-CM

## 2018-07-12 MED ORDER — IRBESARTAN 300 MG PO TABS: 300.0000 mg | ORAL_TABLET | Freq: Every day | ORAL | 3 refills | Status: DC

## 2018-07-12 NOTE — Progress Notes (Signed)
Cardiology Office Note    Date:  07/12/2018   ID:  Tyler Aguirre, DOB 29-Nov-1965, MRN [id number]  PCP:  [name], MD  Cardiologist:   [name], MD   Chief Complaint  Patient presents with  . Follow-up    pt reports no complaints    History of Present Illness:  Tyler Aguirre is a 52 y.o. male with severe obesity, diabetes mellitus, hypertension and hyperlipidemia who presented with exertional dyspnea and was found to have moderate LVH and diastolic dysfunction.  He was able to exercise for over 9 minutes on the Bruce protocol without angina or ECG changes. Echocardiogram confirmed LVH , normal LVEF and diastolic dysfunction with elevated left atrial filling pressure.  It appears that his symptoms have improved even though he is no longer taking diuretics.  The patient specifically denies any chest pain at rest exertion, dyspnea at rest or with exertion, orthopnea, paroxysmal nocturnal dyspnea, syncope, palpitations, focal neurological deficits, intermittent claudication, lower extremity edema, unexplained weight gain, cough, hemoptysis or wheezing.  He seems to be tolerating the current treatment with carvedilol and Erbe Sartain, but his blood pressure is above target range.  Per his report it is consistently so.  In the past he was super obese at 350 pounds.  He remains morbidly obese.  He participated in Dr. [name] bariatric program for a few sessions, but stopped because he did not think they were helpful.  He has had diabetes since his 52s and has been severely obese his whole life. At one point he weighed 350 pounds so his current weight is a big improvement. He intends to continue losing weight.  His glycemic control has deteriorated a little bit since I last saw him.  His most recent hemoglobin A1c was 7.7%.   Past Medical History:  Diagnosis Date  . ALLERGIC RHINITIS 03/12/2009  . DIABETES MELLITUS, TYPE II 02/26/2007  . [place] DISEASE, LUMBAR 06/16/2009  . Flu  10/2017  . HYPERLIPIDEMIA 02/26/2007  . HYPERTENSION 02/26/2007  . NUMBNESS 04/17/2009  . Overweight(278.02) 02/26/2007  . Papilledema 10/2017  . PROSTATITIS, ACUTE 04/17/2009  . TRANSVERSE MYELITIS 05/01/2009  . Vision changes 10/2017    Past Surgical History:  Procedure Laterality Date  . COLONOSCOPY    . DENTAL SURGERY    . POLYPECTOMY      Current Medications: Outpatient Medications Prior to Visit  Medication Sig Dispense Refill  . amoxicillin (AMOXIL) 500 MG tablet Take 500 mg by mouth 2 (two) times daily.    Marland Kitchen atorvastatin (LIPITOR) 10 MG tablet Take 1 tablet (10 mg total) by mouth daily. 30 tablet 0  . Aurora Lancet Super Thin 30G MISC by Does not apply route. As direceted     . carvedilol (COREG) 3.125 MG tablet Take 1 tablet (3.125 mg total) by mouth 2 (two) times daily. 60 tablet 5  . Continuous Blood Gluc Sensor (FREESTYLE LIBRE 14 DAY SENSOR) MISC 1 each by Does not apply route every 14 (fourteen) days. Change every 2 weeks 2 each 11  . glucose blood test strip 1 each by Other route as needed. Use as instructed 100 each 3  . Insulin Glargine (TOUJEO MAX SOLOSTAR) 300 UNIT/ML SOPN Inject 60 Units into the skin daily.    . Insulin Pen Needle (PEN NEEDLES 31GX5/16") 31G X 8 MM MISC Use 4x a day 100 each 3  . NOVOLOG FLEXPEN 100 UNIT/ML FlexPen INJECT 40-50 UNITS 3 TIMES A DAY AS DIRECTED 135 pen 0  . Vitamin  D, Ergocalciferol, (DRISDOL) 50000 units CAPS capsule Take 1 capsule (50,000 Units total) by mouth every 7 (seven) days. 4 capsule 0  . irbesartan (AVAPRO) 150 MG tablet Take 1 tablet (150 mg total) by mouth daily. 30 tablet 3   No facility-administered medications prior to visit.      Allergies:   Ibuprofen and Metformin and related   Social History   Socioeconomic History  . Marital status: Single    Spouse name: Not on file  . Number of children: Not on file  . Years of education: Not on file  . Highest education level: Not on file  Occupational History  . Not on  file  Social Needs  . Financial resource strain: Not on file  . Food insecurity:    Worry: Not on file    Inability: Not on file  . Transportation needs:    Medical: Not on file    Non-medical: Not on file  Tobacco Use  . Smoking status: Never Smoker  . Smokeless tobacco: Never Used  Substance and Sexual Activity  . Alcohol use: Yes  . Drug use: No  . Sexual activity: Yes    Birth control/protection: Condom  Lifestyle  . Physical activity:    Days per week: Not on file    Minutes per session: Not on file  . Stress: Not on file  Relationships  . Social connections:    Talks on phone: Not on file    Gets together: Not on file    Attends religious service: Not on file    Active member of club or organization: Not on file    Attends meetings of clubs or organizations: Not on file    Relationship status: Not on file  Other Topics Concern  . Not on file  Social History Narrative  . Not on file     Family History:  The patient's family history includes Anemia in his mother; Arthritis in his mother; Cancer in his paternal grandfather and paternal uncle; Diabetes in his paternal grandfather and paternal grandmother; Heart disease in his father, maternal uncle, and mother; Hyperlipidemia in his father; Hypertension in his father, maternal grandfather, paternal grandfather, and paternal grandmother; Mental retardation in his paternal grandfather; Obesity in his maternal grandfather and paternal grandmother; Stroke in his paternal uncle.   ROS:   Please see the history of present illness.    ROS all other systems are reviewed and are negative  PHYSICAL EXAM:   VS:  BP (!) 160/70   Pulse 70   Ht 6' (1.829 m)   Wt (!) 310 lb (140.6 kg)   BMI 42.04 kg/m     General: Alert, oriented x3, no distress, morbidly obese Head: no evidence of trauma, PERRL, EOMI, no exophtalmos or lid lag, no myxedema, no xanthelasma; normal ears, nose and oropharynx Neck: normal jugular venous pulsations  and no hepatojugular reflux; brisk carotid pulses without delay and no carotid bruits Chest: clear to auscultation, no signs of consolidation by percussion or palpation, normal fremitus, symmetrical and full respiratory excursions Cardiovascular: normal position and quality of the apical impulse, regular rhythm, normal first and second heart sounds, no murmurs, rubs or gallops Abdomen: no tenderness or distention, no masses by palpation, no abnormal pulsatility or arterial bruits, normal bowel sounds, no hepatosplenomegaly Extremities: no clubbing, cyanosis or edema; 2+ radial, ulnar and brachial pulses bilaterally; 2+ right femoral, posterior tibial and dorsalis pedis pulses; 2+ left femoral, posterior tibial and dorsalis pedis pulses; no subclavian or femoral  bruits Neurological: grossly nonfocal Psych: Normal mood and affect  Wt Readings from Last 3 Encounters:  07/12/18 (!) 310 lb (140.6 kg)  06/12/18 (!) 312 lb (141.5 kg)  06/05/18 (!) 315 lb 9.6 oz (143.2 kg)      Studies/Labs Reviewed:   EKG:  EKG is not ordered today.    Recent Labs: 03/08/2018: ALT 62; BUN 13; Creatinine, Ser 0.69; Hemoglobin 13.2; Platelets 121; Potassium 3.7; Sodium 140; TSH 4.110   Lipid Panel    Component Value Date/Time   CHOL 246 (H) 03/08/2018 0943   TRIG 142 03/08/2018 0943   TRIG 200 (H) 10/17/2006 1033   HDL 51 03/08/2018 0943   CHOLHDL 4.8 03/08/2018 0943   CHOLHDL 5 08/22/2016 0835   VLDL 18.4 08/22/2016 0835   LDLCALC 167 (H) 03/08/2018 0943   LDLDIRECT 147.8 01/22/2010 0851    Additional studies/ records that were reviewed today include:   ASSESSMENT:    1. Chronic diastolic heart failure ([place])   2. Essential hypertension   3. Diabetes mellitus type 2 in obese ([place])   4. Dyslipidemia   5. Super obese     PLAN:  In order of problems listed above:  1. CHF:  has good functional status even though he is not taking the diuretic.  Presumably this is because of the compliance with  sodium restriction 2. HTN: Blood pressure is on acceptably high both for heart failure and renal risk with diabetes mellitus.  Target less than 130/80.  Increase irbesartan 50 mg daily.  Sinus and blood pressure recordings via [email or web address] in a couple of weeks 3. DM: He has severe insulin resistance.  He has not really made additional progress with weight loss and his glycemic control has deteriorated. 4. HLP: time to recheck his lipid profile after adjusting his statin 5. Obesity: Encouraged him to continue efforts at maintaining a healthy lifestyle.  Even if he does not actually see his weight change, improve dietary compliance and increased frequency of size will lead to better health outcomes.  Medication Adjustments/Labs and Tests Ordered: Current medicines are reviewed at length with the patient today.  Concerns regarding medicines are outlined above.  Medication changes, Labs and Tests ordered today are listed in the Patient Instructions below. Patient Instructions  Medication Instructions: Dr Sallyanne Kuster has recommended making the following medication changes: 1. INCREASE Irbesartan to 300 mg daily  Your physician has requested that you regularly monitor your blood pressure at home. Please use the same machine to check your blood pressure daily. Keep a record of your blood pressures using the log sheet provided. In 2 weeks, please report your readings back to Dr C. You may use our online patient portal 'MyChart' or you can call the office to speak with a nurse.  Labwork: Your physician recommends that you return for lab work at your convenience - FASTING.  Testing/Procedures: NONE ORDERED  Follow-up: Dr Sallyanne Kuster recommends that you schedule a follow-up appointment in 12 months. You will receive a reminder letter in the mail two months in advance. If you don't receive a letter, please call our office to schedule the follow-up appointment.  If you need a refill on your cardiac medications  before your next appointment, please call your pharmacy.    Signed, [name], MD  07/12/2018 8:42 PM    [address], [place], [place]  [address] Phone: [phone]; Fax: [phone]

## 2018-07-12 NOTE — Patient Instructions (Signed)
Medication Instructions: Dr Royann Shiversroitoru has recommended making the following medication changes: 1. INCREASE Irbesartan to 300 mg daily  Your physician has requested that you regularly monitor your blood pressure at home. Please use the same machine to check your blood pressure daily. Keep a record of your blood pressures using the log sheet provided. In 2 weeks, please report your readings back to Dr C. You may use our online patient portal 'MyChart' or you can call the office to speak with a nurse.  Labwork: Your physician recommends that you return for lab work at your convenience - FASTING.  Testing/Procedures: NONE ORDERED  Follow-up: Dr Royann Shiversroitoru recommends that you schedule a follow-up appointment in 12 months. You will receive a reminder letter in the mail two months in advance. If you don't receive a letter, please call our office to schedule the follow-up appointment.  If you need a refill on your cardiac medications before your next appointment, please call your pharmacy.

## 2018-07-25 ENCOUNTER — Encounter: Payer: Self-pay | Admitting: Neurology

## 2018-07-29 ENCOUNTER — Other Ambulatory Visit: Payer: Self-pay | Admitting: Internal Medicine

## 2018-08-06 ENCOUNTER — Other Ambulatory Visit: Payer: Self-pay

## 2018-08-06 MED ORDER — INSULIN ASPART 100 UNIT/ML FLEXPEN: PEN_INJECTOR | SUBCUTANEOUS | 3 refills | Status: DC

## 2018-08-13 DIAGNOSIS — Z86010 Personal history of colonic polyps: Secondary | ICD-10-CM | POA: Diagnosis not present

## 2018-08-17 IMAGING — MR MR ORBITS WO/W CM
11 of 18 series · 30 of 48 positions shown · IV contrast (multihance)
Comparison: Prior MRI from 04/21/2009.

CLINICAL DATA: Initial evaluation for acute headaches, right eye
pain and swelling.

EXAM:
MRI HEAD AND ORBITS WITHOUT AND WITH CONTRAST
TECHNIQUE: Multiplanar, multiecho pulse sequences of the brain and surrounding
structures were obtained without and with intravenous contrast.
Multiplanar, multiecho pulse sequences of the orbits and surrounding
structures were obtained including fat saturation techniques, before
and after intravenous contrast administration.
CONTRAST:  20mL MULTIHANCE GADOBENATE DIMEGLUMINE 529 MG/ML IV SOLN

[Series 3: DWI · axial · 3.0mm · 0.94mm/px · z∈[-1,+128]mm · 7 of 90 slices shown (1 of 2)]
[im 1/90]
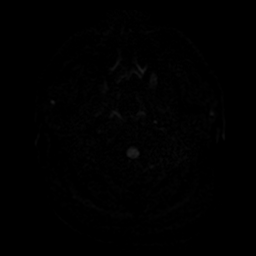
[im 15/90]
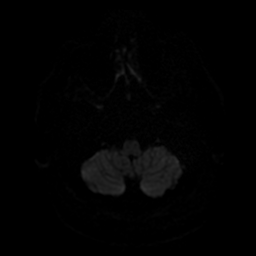
[im 30/90]
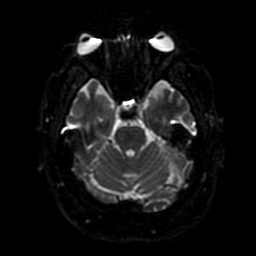
[im 45/90]
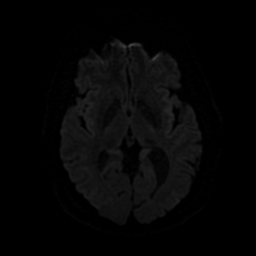
[im 60/90]
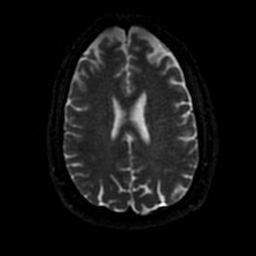
[im 75/90]
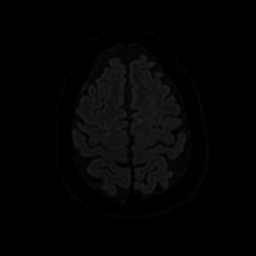
[im 90/90]
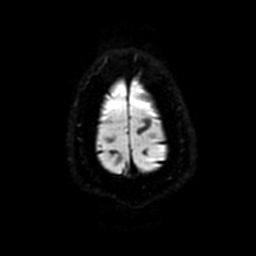

[Series 4: T2 · axial · 5.0mm · 0.47mm/px · z∈[-1,+128]mm · 2 of 23 slices shown (1 of 2)]
[im 1/23]
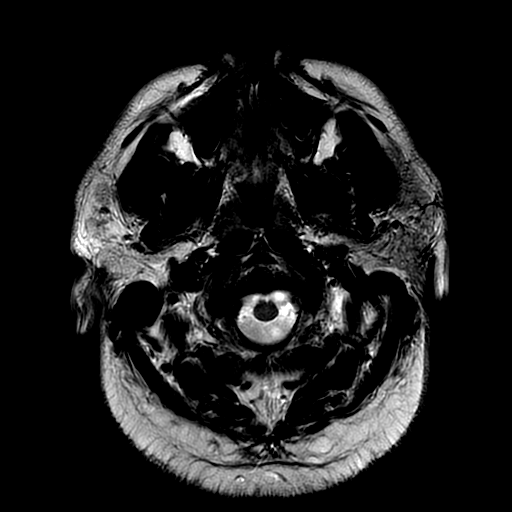
[im 23/23]
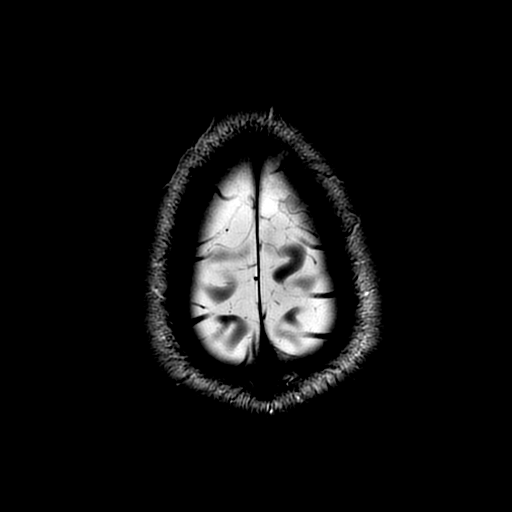

[Series 5: FLAIR · axial · 3.0mm · 0.94mm/px · z∈[-1,+128]mm · 2 of 23 slices shown (1 of 2)]
[im 1/23]
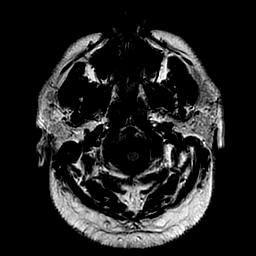
[im 23/23]
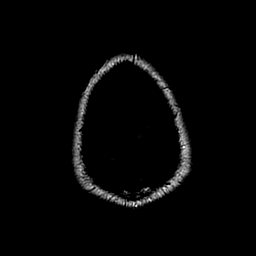

[Series 6: FLAIR · sagittal · 5.0mm · 0.51mm/px · 2 of 25 slices shown (2 of 2)]
[im 1/25]
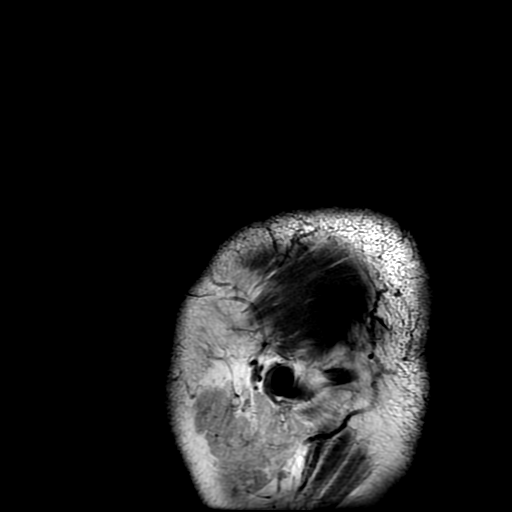
[im 25/25]
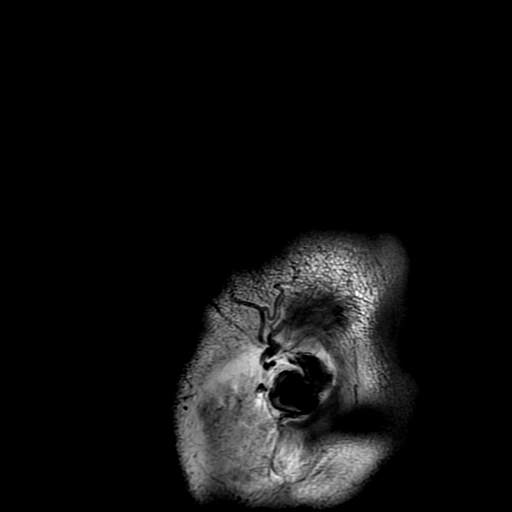

[Series 7: DWI · coronal · 4.0mm · 0.94mm/px · 5 of 72 slices shown (2 of 2)]
[im 1/72]
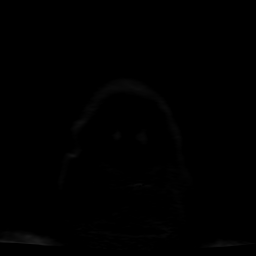
[im 18/72]
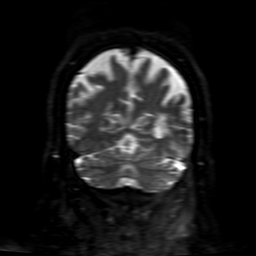
[im 36/72]
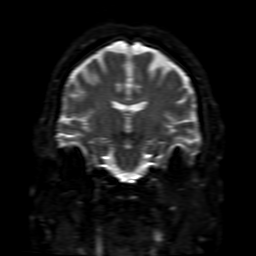
[im 54/72]
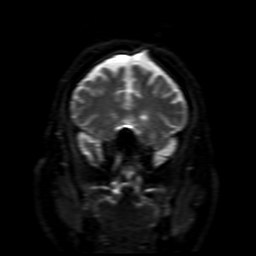
[im 72/72]
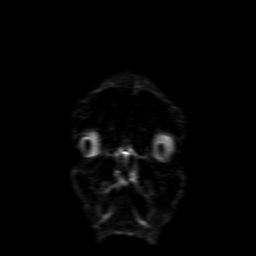

[Series 12: T1 · coronal · 4.0mm · 0.35mm/px · 2 of 26 slices shown (1 of 2)]
[im 1/26]
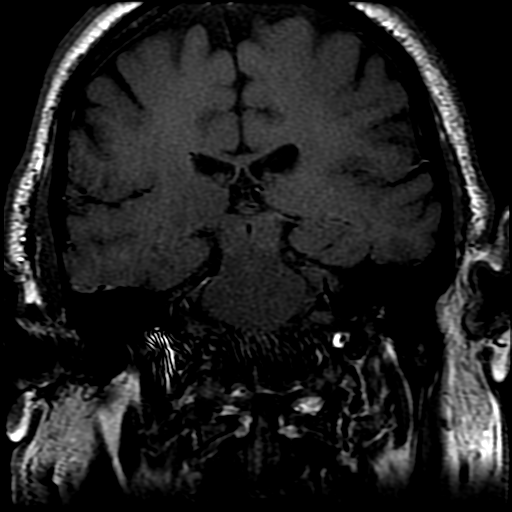
[im 26/26]
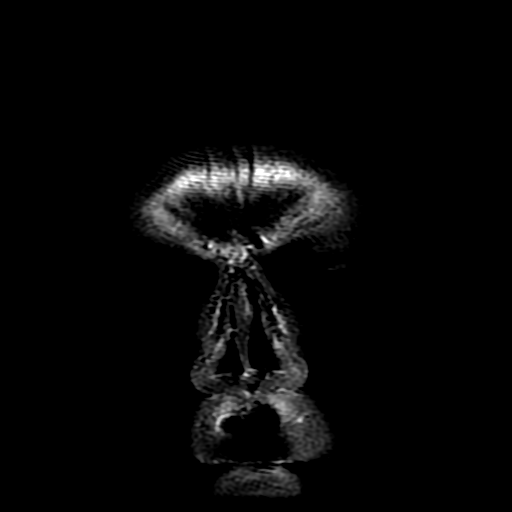

[Series 14: T1 · axial · 3.0mm · 0.35mm/px · 1 of 20 slices shown (2 of 2)]
[im 1/20]
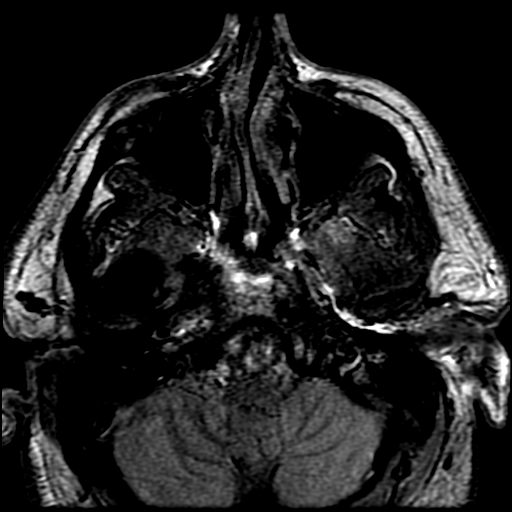

[Series 15: T2 · coronal · 5.0mm · 0.43mm/px · 2 of 30 slices shown (2 of 2)]
[im 1/30]
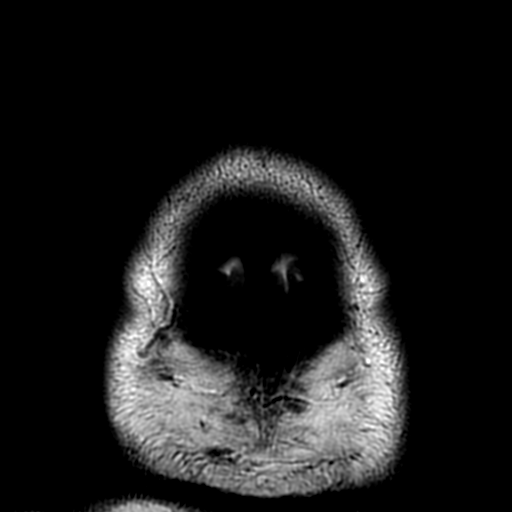
[im 30/30]
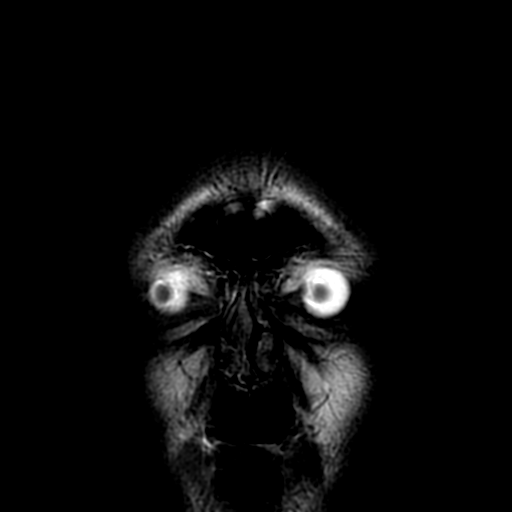

[Series 16: T1 fat-sat · coronal · 4.0mm · 0.35mm/px · 2 of 26 slices shown]
[im 1/26]
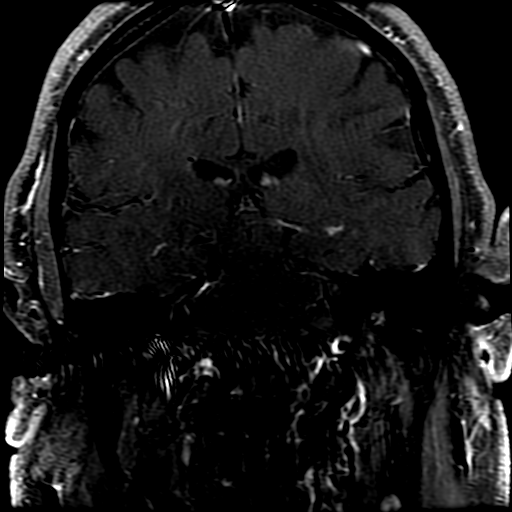
[im 26/26]
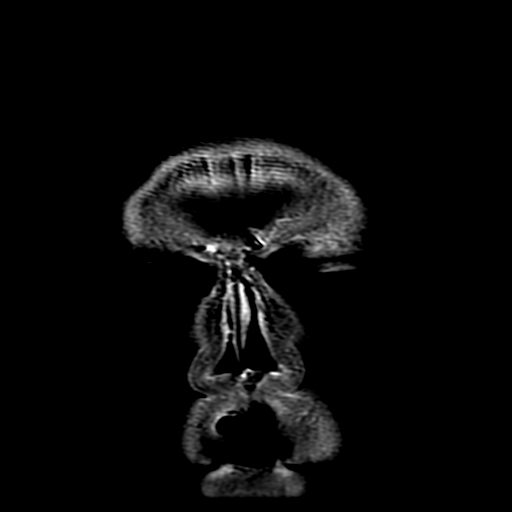

[Series 350: ADC · axial · 3.0mm · 0.94mm/px · z∈[-1,+128]mm · 3 of 45 slices shown (1 of 2)]
[im 1/45]
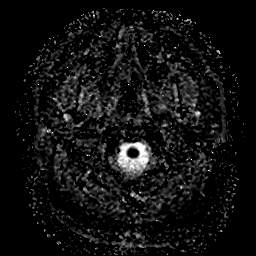
[im 23/45]
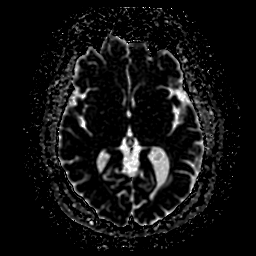
[im 45/45]
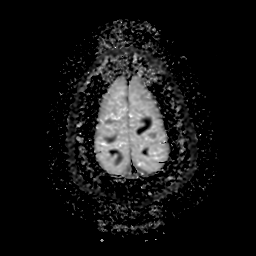

[Series 750: ADC · coronal · 4.0mm · 0.94mm/px · 2 of 36 slices shown (2 of 2)]
[im 1/36]
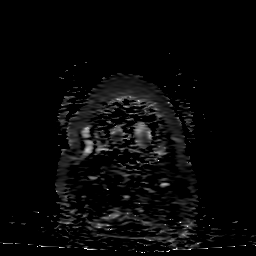
[im 36/36]
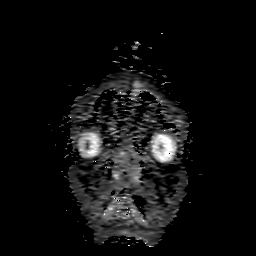

[30 of 48 positions shown; findings below may reference images not displayed]

FINDINGS: MRI HEAD FINDINGS

Brain: Cerebral volume within normal limits. Asymmetric confluent
T2/FLAIR hyperintensity within the left periatrial white matter,
similar to previous. Additional mild T2/FLAIR hyperintensity within
the periventricular white matter both cerebral hemispheres,
nonspecific, but mildly progressed relative to previous exam. One of
these foci position within the right periatrial white matter is
oriented perpendicular to the lateral ventricle (series 5, image
14).

No abnormal foci of restricted diffusion to suggest acute or
subacute ischemia. Gray-white matter differentiation maintained. No
other evidence for chronic infarction. No foci of susceptibility
artifact to suggest acute or chronic intracranial hemorrhage.

No mass lesion, midline shift or mass effect. No hydrocephalus. No
extra-axial fluid collection. Major dural sinuses are grossly
patent. No findings to suggest cavernous sinus thrombosis.

Pituitary gland suprasellar region within normal limits. Midline
structures intact and normal.

Vascular: Major intracranial vascular flow voids are maintained.

Skull and upper cervical spine: Craniocervical junction normal.
Upper cervical spine within normal limits. Bone marrow signal
intensity normal.

MRI ORBITS FINDINGS

Orbits:  Study degraded by motion artifact.

Globes are symmetric in size with normal morphology. Optic nerves
symmetric in size and normal in appearance. No appreciable optic
nerve edema or abnormal enhancement. No abnormality seen at the
orbital apices are about the cavernous sinus. Optic chiasm normally
position within the suprasellar cistern. Extra-ocular muscles are
normal in appearance and symmetric bilaterally. Lacrimal glands are
normal. Superior orbital veins symmetric and within normal limits
bilaterally. Intraconal and extraconal fat well-maintained.

Visualized sinuses: Mild scattered mucoperiosteal thickening within
the ethmoidal air cells. Visualized paranasal sinuses are otherwise
clear. No air-fluid level to suggest acute sinusitis.

Soft tissues: No appreciable periorbital soft tissue abnormality.
IMPRESSION: 1. No acute intracranial abnormality.
2. Asymmetric confluent T2/FLAIR hyperintensity within the left
periatrial white matter, similar to previous. Additional mild
cerebral white matter changes are mildly progressed relative 7262.
Findings are nonspecific, and may due to chronic small vessel
ischemic changes. However, one of these foci positioned at the right
periatrial white matter is oriented perpendicular to the lateral
ventricle, which can be seen in the setting of underlying
demyelinating disease. Clinical correlation recommended. No findings
to suggest active demyelination on this exam.
3. Normal MRI of the orbits. No abnormality about the cavernous
sinus.
4. Mild ethmoidal sinus disease, likely allergic/inflammatory
nature.

## 2018-08-19 ENCOUNTER — Other Ambulatory Visit: Payer: Self-pay | Admitting: Cardiovascular Disease

## 2018-08-20 DIAGNOSIS — Z125 Encounter for screening for malignant neoplasm of prostate: Secondary | ICD-10-CM | POA: Diagnosis not present

## 2018-08-20 DIAGNOSIS — E782 Mixed hyperlipidemia: Secondary | ICD-10-CM | POA: Diagnosis not present

## 2018-08-23 DIAGNOSIS — G4733 Obstructive sleep apnea (adult) (pediatric): Secondary | ICD-10-CM | POA: Diagnosis not present

## 2018-08-23 DIAGNOSIS — D696 Thrombocytopenia, unspecified: Secondary | ICD-10-CM | POA: Diagnosis not present

## 2018-08-23 DIAGNOSIS — E782 Mixed hyperlipidemia: Secondary | ICD-10-CM | POA: Diagnosis not present

## 2018-08-23 DIAGNOSIS — I10 Essential (primary) hypertension: Secondary | ICD-10-CM | POA: Diagnosis not present

## 2018-09-03 DIAGNOSIS — J0140 Acute pansinusitis, unspecified: Secondary | ICD-10-CM | POA: Diagnosis not present

## 2018-09-21 ENCOUNTER — Ambulatory Visit: Payer: BLUE CROSS/BLUE SHIELD | Admitting: Internal Medicine

## 2018-09-21 ENCOUNTER — Other Ambulatory Visit: Payer: Self-pay | Admitting: Cardiology

## 2018-09-23 ENCOUNTER — Other Ambulatory Visit: Payer: Self-pay | Admitting: Internal Medicine

## 2018-09-29 ENCOUNTER — Other Ambulatory Visit: Payer: Self-pay | Admitting: Internal Medicine

## 2018-10-08 ENCOUNTER — Other Ambulatory Visit: Payer: Self-pay

## 2018-10-08 NOTE — Telephone Encounter (Signed)
Valsartan 320 mg daily please, #90, RF 3

## 2018-10-08 NOTE — Telephone Encounter (Signed)
Irbesartan is on manufacturer backorder. Pharmacy is requesting an alternative medication. Patient has "roughly 5 day supply left."

## 2018-10-11 ENCOUNTER — Telehealth: Payer: Self-pay | Admitting: Cardiovascular Disease

## 2018-10-11 ENCOUNTER — Other Ambulatory Visit: Payer: Self-pay | Admitting: *Deleted

## 2018-10-11 MED ORDER — IRBESARTAN 300 MG PO TABS: 300.0000 mg | ORAL_TABLET | Freq: Every day | ORAL | 1 refills | Status: AC

## 2018-10-11 MED ORDER — VALSARTAN 320 MG PO TABS: 320.0000 mg | ORAL_TABLET | Freq: Every day | ORAL | 3 refills | Status: AC

## 2018-10-11 NOTE — Telephone Encounter (Signed)
See telephone noe 11/181/19. Pharmacist  called to find out if patient medication to give for back order --irbesartan 300 mg.  verbal order given- per dr croitoru - valsartan 320 mg qd #90 x 3

## 2018-10-11 NOTE — Telephone Encounter (Signed)
New message ° ° ° ° °

## 2018-10-11 NOTE — Telephone Encounter (Signed)
pharmacy called --Verbal order given

## 2018-10-26 DIAGNOSIS — J069 Acute upper respiratory infection, unspecified: Secondary | ICD-10-CM | POA: Diagnosis not present

## 2018-11-05 DIAGNOSIS — J0100 Acute maxillary sinusitis, unspecified: Secondary | ICD-10-CM | POA: Diagnosis not present

## 2018-11-05 DIAGNOSIS — J209 Acute bronchitis, unspecified: Secondary | ICD-10-CM | POA: Diagnosis not present

## 2018-11-30 ENCOUNTER — Other Ambulatory Visit: Payer: Self-pay

## 2018-11-30 NOTE — Telephone Encounter (Signed)
Error

## 2018-12-04 ENCOUNTER — Encounter: Payer: Self-pay | Admitting: Internal Medicine

## 2018-12-07 ENCOUNTER — Ambulatory Visit (INDEPENDENT_AMBULATORY_CARE_PROVIDER_SITE_OTHER): Payer: BLUE CROSS/BLUE SHIELD | Admitting: Internal Medicine

## 2018-12-07 ENCOUNTER — Encounter: Payer: Self-pay | Admitting: Internal Medicine

## 2018-12-07 VITALS — BP 128/70 | HR 80 | Ht 72.0 in | Wt 319.0 lb

## 2018-12-07 DIAGNOSIS — Z6841 Body Mass Index (BMI) 40.0 and over, adult: Secondary | ICD-10-CM

## 2018-12-07 DIAGNOSIS — E11319 Type 2 diabetes mellitus with unspecified diabetic retinopathy without macular edema: Secondary | ICD-10-CM | POA: Diagnosis not present

## 2018-12-07 DIAGNOSIS — E785 Hyperlipidemia, unspecified: Secondary | ICD-10-CM

## 2018-12-07 DIAGNOSIS — Z794 Long term (current) use of insulin: Secondary | ICD-10-CM | POA: Diagnosis not present

## 2018-12-07 LAB — POCT GLYCOSYLATED HEMOGLOBIN (HGB A1C): Hemoglobin A1C: 7.5 % — AB (ref 4.0–5.6)

## 2018-12-07 MED ORDER — SEMAGLUTIDE(0.25 OR 0.5MG/DOS) 2 MG/1.5ML ~~LOC~~ SOPN: 0.5000 mg | PEN_INJECTOR | SUBCUTANEOUS | 5 refills | Status: DC

## 2018-12-07 MED ORDER — INSULIN GLARGINE (2 UNIT DIAL) 300 UNIT/ML ~~LOC~~ SOPN: 80.0000 [IU] | PEN_INJECTOR | Freq: Every day | SUBCUTANEOUS | 3 refills | Status: AC

## 2018-12-07 MED ORDER — INSULIN ASPART 100 UNIT/ML FLEXPEN: PEN_INJECTOR | SUBCUTANEOUS | 3 refills | Status: AC

## 2018-12-07 MED ORDER — FREESTYLE LIBRE 14 DAY SENSOR MISC: 1.0000 | 3 refills | Status: DC

## 2018-12-07 NOTE — Patient Instructions (Addendum)
Please continue: - Toujeo 80 units in a.m. - Humalog 10-15 min before a meal. - 40 units before a smaller meal - 50 units before a regular meal  - 60 units before a larger meal (use this for b'fast) If you need to do a correction at bedtime, do not take >20-30 Units.  Please start Ozempic 0.25 mg weekly in a.m. (for example on Sunday morning) x 4 weeks, then increase to 0.5 mg weekly in a.m. if no nausea or hypoglycemia.  Please return as needed.

## 2018-12-07 NOTE — Addendum Note (Signed)
Addended by: Darliss Ridgel I on: 12/07/2018 11:25 AM   Modules accepted: Orders

## 2018-12-07 NOTE — Progress Notes (Signed)
Patient ID: Tyler Aguirre, male   DOB: 1966-09-25, 53 y.o.   MRN: [id number]   HPI: Tyler Aguirre is a 53 y.o.-year-old male, returning for follow-up for DM2, dx in the 1990s, insulin-dependent, uncontrolled, with complications (DR). Last visit 6 months ago.  He did not return in 3 months as advised.  His father recently died and he was moved to [place], [place], to take care of his mother.  This is our last appointment.  Last hemoglobin A1c was: Lab Results  Component Value Date   HGBA1C 7.7 (A) 06/05/2018   HGBA1C 7.5 (H) 03/08/2018   HGBA1C 7.7 03/06/2018   He is on: - Toujeo 80 units in a.m. - Humalog 10-15 min before a meal. - 40 units before a smaller meal - 50 units before a regular meal  - 60 units before a larger meal (use this for b'fast) If you need to do a correction at bedtime, do not take >20-30 Units. He could not tolerate Trulicity 0.34 mg weekly  >> nausea. He had to stop Invokana in the past because of constipation. He stopped Metformin 2/2 GI intolerance.  He checks his sugars more than 4 times a day with his freestyle libre CGM.  He did not bring his receiver with him today.: 25-75% range >> now, per his recall: - am:  110-180 >> 154-200 >> 170-300 - 2h after b'fast: 275-330 >> 210-320 >> n/c - before lunch: 280-350 >> 275->350 >> 170-300 - 2h after lunch: >325 >> 275->350 >> n/c - before dinner:  >300 >> 185->350 >> 150-200s   - 2h after dinner:  >310  >> 225-300 >> 200s - bedtime:100 180 >> 200-290 >> 100-170 - nighttime:  110-180 >> 51-143 >> n/c  Lowest sugar was 39 (woke him up) x1... >> 51 >> 100; he has hypoglycemia awareness in the 80s. Highest sugar was HI >> 451 >> 300.  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  He mentions that he cannot take food with him and he has to rely on fast food.  -No CKD, last BUN/creatinine:  Lab Results   Component Value Date   BUN 13 03/08/2018   BUN 10 10/27/2017   CREATININE 0.69 (L) 03/08/2018   CREATININE 0.75 10/27/2017  On Avapro.  ACR improved: Lab Results  Component Value Date   MICRALBCREAT 1.9 08/22/2016   MICRALBCREAT 6.7 01/22/2010   MICRALBCREAT 4.1 09/02/2009   MICRALBCREAT 6.9 02/05/2009   MICRALBCREAT 13.9 11/30/2007   -+ HL; last set of lipids: Lab Results  Component Value Date   CHOL 246 (H) 03/08/2018   HDL 51 03/08/2018   LDLCALC 167 (H) 03/08/2018   LDLDIRECT 147.8 01/22/2010   TRIG 142 03/08/2018   CHOLHDL 4.8 03/08/2018  On Lipitor. - last eye exam was in 12/2017: + DR -He denies numbness and tingling in his feet.  He had an episode of presyncope when exercising in the park in 2018. An EKG, exercise stress test, 2D Echo (Dr. Sallyanne Kuster) >> normal.  He recently saw neurology and was diagnosed with pseudotumor cerebri.  He was advised to start Diamox >> could not start b/c dizziness..  ROS: Constitutional: no weight gain/no weight loss, no fatigue, no subjective hyperthermia, no subjective hypothermia Eyes: no blurry vision, no xerophthalmia ENT: no sore throat, no nodules palpated in neck, no dysphagia, no odynophagia, no hoarseness  Cardiovascular: no CP/no SOB/no palpitations/no leg swelling Respiratory: no cough/no SOB/no wheezing Gastrointestinal: no N/no V/no D/no C/no acid reflux Musculoskeletal: no muscle aches/no joint aches Skin: no rashes, no hair loss Neurological: no tremors/no numbness/no tingling/no dizziness  I reviewed pt's medications, allergies, PMH, social hx, family hx, and changes were documented in the history of present illness. Otherwise, unchanged from my initial visit note.  Past Medical History:  Diagnosis Date  . ALLERGIC RHINITIS 03/12/2009  . DIABETES MELLITUS, TYPE II 02/26/2007  . 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 Flu 10/2017  . HYPERLIPIDEMIA 02/26/2007  . HYPERTENSION 02/26/2007  . NUMBNESS 04/17/2009  .  Overweight(278.02) 02/26/2007  . Papilledema 10/2017  . PROSTATITIS, ACUTE 04/17/2009  . TRANSVERSE MYELITIS 05/01/2009  . Vision changes 10/2017   Past Surgical History:  Procedure Laterality Date  . COLONOSCOPY    . DENTAL SURGERY    . POLYPECTOMY     Social History   Social History  . Marital status: Single    Spouse name: N/A  . 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 Smoking status: Never Smoker  . Smokeless tobacco: Never Used  . Alcohol use Yes  . Drug use: No   Current Outpatient Medications on File Prior to Visit  Medication Sig Dispense Refill  . atorvastatin (LIPITOR) 10 MG tablet Take 1 tablet (10 mg total) by mouth daily. 30 tablet 0  . Aurora Lancet Super Thin 30G MISC by Does not apply route. As direceted     . carvedilol (COREG) 3.125 MG tablet TAKE 1 TABLET BY MOUTH 2 TIMES DAILY. 60 tablet 9  . Continuous Blood Gluc Sensor (FREESTYLE LIBRE 14 DAY SENSOR) MISC APPLY EVERY 14 (FOURTEEN) DAYS. CHANGE EVERY 2 WEEKS 2 each 8  . glucose blood test strip 1 each by Other route as needed. Use as instructed 100 each 3  . insulin aspart (NOVOLOG FLEXPEN) 100 UNIT/ML FlexPen INJECT 40-50 UNITS 3 TIMES A DAY AS DIRECTED 42 pen 3  . Insulin Glargine (TOUJEO MAX SOLOSTAR) 300 UNIT/ML SOPN Inject 60 Units into the skin daily.    . Insulin Pen Needle (PEN NEEDLES 31GX5/16") 31G X 8 MM MISC Use 4x a day 100 each 3  . irbesartan (AVAPRO) 300 MG tablet Take 1 tablet (300 mg total) by mouth daily. 90 tablet 1  . valsartan (DIOVAN) 320 MG tablet Take 1 tablet (320 mg total) by mouth daily. 90 tablet 3  . Vitamin D, Ergocalciferol, (DRISDOL) 50000 units CAPS capsule Take 1 capsule (50,000 Units total) by mouth every 7 (seven) days. 4 capsule 0   No current facility-administered medications on file prior to visit.    Allergies  Allergen Reactions  . Ibuprofen Other (See Comments)    Has to go to the bathroom all  the time  . Metformin And Related Diarrhea    Unable to tolerate XL formulation.    Family History  Problem Relation Age of Onset  . Heart disease Mother        valve dz  . Arthritis Mother        RA  . Anemia Mother   . Hypertension Father   . Heart disease Father   . Hyperlipidemia Father   . Heart disease Maternal Uncle        CAD  . Diabetes Paternal Grandmother   . Obesity Paternal Grandmother   . Hypertension Paternal Grandmother   .  Cancer Paternal Grandfather        lung  . Mental retardation Paternal Grandfather        suicided  . Hypertension Paternal Grandfather   . Diabetes Paternal Grandfather   . Obesity Maternal Grandfather   . Hypertension Maternal Grandfather   . Stroke Paternal Uncle   . Cancer Paternal Uncle    PE: BP 128/70   Pulse 80   Ht 6' (1.829 m) Comment: measured  Wt (!) 319 lb (144.7 kg)   SpO2 99%   BMI 43.26 kg/m  Wt Readings from Last 3 Encounters:  12/07/18 (!) 319 lb (144.7 kg)  07/12/18 (!) 310 lb (140.6 kg)  06/12/18 (!) 312 lb (141.5 kg)   Constitutional: obese, in NAD Eyes: PERRLA, EOMI, no exophthalmos ENT: moist mucous membranes, no thyromegaly, no cervical lymphadenopathy Cardiovascular: RRR, No MRG Respiratory: CTA B Gastrointestinal: abdomen soft, NT, ND, BS+ Musculoskeletal: no deformities, strength intact in all 4 Skin: moist, warm, no rashes Neurological: no tremor with outstretched hands, DTR normal in all 4  ASSESSMENT: 1. DM2, insulin-dependent, uncontrolled, with complications - DR  2. HL  3. Obesity class 3  PLAN:  1. Patient with longstanding, uncontrolled, type 2 diabetes, very insulin resistant, on basal/bolus insulin regimen, with slight improvement since last visit.  However, his sugars are still high in the morning and then after breakfast, as he still has fast food for breakfast.  We discussed in the past that he absolutely needs to change this to give her sugars a chance to improve.  He is already  taking 60 units of insulin before breakfast without good results in terms of postprandial hyperglycemia.  I again advised him today to try to limit the amount of fast food and given examples of healthier breakfast meals.  He thinks that after moving to [place] he will get a chance to improve this. -At this visit we discussed again about maybe trying a GLP-1 receptor agonist.  He tried Trulicity in the past and he developed nausea with a low dose, but he is open to try Ozempic.  Discussed about benefits and possible side effects.  We will start at a low dose and increase as tolerated.  Given coupon card for Ozempic. -For now we will continue the current doses of insulins. - I suggested to:  Patient Instructions  Please continue: - Toujeo 80 units in a.m. - Humalog 10-15 min before a meal. - 40 units before a smaller meal - 50 units before a regular meal  - 60 units before a larger meal (use this for b'fast) If you need to do a correction at bedtime, do not take >20-30 Units.  Please start Ozempic 0.25 mg weekly in a.m. (for example on Sunday morning) x 4 weeks, then increase to 0.5 mg weekly in a.m. if no nausea or hypoglycemia.  Please return as needed.  - today, HbA1c is 7.5% (slightly better) - continue checking sugars at different times of the day - check 4 times a day, rotating check times. - advised for yearly eye exams >> he is UTD - Return to clinic as needed -he will establish care with endocrinology in [place]       2. Obesity class 3 -He was followed in the weight loss clinic -Unfortunately, we were not able to use SGLT2 inhibitors and GLP-1 receptor agonists as they caused side effects in the past: Constipation and nausea respectively.  However, at last visit he was telling me that he would be open to  retry these if necessary.  At this visit, he agrees to try Ozempic.  3. HL - Reviewed latest lipid panel from 02/2018: LDL very high.  He changed from Zocor to Lipitor. Lab Results   Component Value Date   CHOL 246 (H) 03/08/2018   HDL 51 03/08/2018   LDLCALC 167 (H) 03/08/2018   LDLDIRECT 147.8 01/22/2010   TRIG 142 03/08/2018   CHOLHDL 4.8 03/08/2018  - Continues Lipitor without side effects. - We discussed at last visit and again today about the importance of reducing fat in his diet  Philemon Kingdom, MD PhD [facility]

## 2018-12-31 DIAGNOSIS — E113293 Type 2 diabetes mellitus with mild nonproliferative diabetic retinopathy without macular edema, bilateral: Secondary | ICD-10-CM | POA: Diagnosis not present

## 2019-01-04 DIAGNOSIS — J0140 Acute pansinusitis, unspecified: Secondary | ICD-10-CM | POA: Diagnosis not present

## 2019-01-04 DIAGNOSIS — E11319 Type 2 diabetes mellitus with unspecified diabetic retinopathy without macular edema: Secondary | ICD-10-CM | POA: Diagnosis not present

## 2019-01-04 DIAGNOSIS — E7800 Pure hypercholesterolemia, unspecified: Secondary | ICD-10-CM | POA: Diagnosis not present

## 2019-01-04 DIAGNOSIS — I10 Essential (primary) hypertension: Secondary | ICD-10-CM | POA: Diagnosis not present

## 2019-01-24 ENCOUNTER — Other Ambulatory Visit: Payer: Self-pay | Admitting: Internal Medicine

## 2019-02-14 DIAGNOSIS — I10 Essential (primary) hypertension: Secondary | ICD-10-CM | POA: Diagnosis not present

## 2019-02-14 DIAGNOSIS — E785 Hyperlipidemia, unspecified: Secondary | ICD-10-CM | POA: Diagnosis not present

## 2019-02-14 DIAGNOSIS — E1165 Type 2 diabetes mellitus with hyperglycemia: Secondary | ICD-10-CM | POA: Diagnosis not present

## 2019-02-14 DIAGNOSIS — Z125 Encounter for screening for malignant neoplasm of prostate: Secondary | ICD-10-CM | POA: Diagnosis not present

## 2019-03-01 DIAGNOSIS — J0110 Acute frontal sinusitis, unspecified: Secondary | ICD-10-CM | POA: Diagnosis not present

## 2019-03-29 DIAGNOSIS — R51 Headache: Secondary | ICD-10-CM | POA: Diagnosis not present

## 2019-03-29 DIAGNOSIS — J329 Chronic sinusitis, unspecified: Secondary | ICD-10-CM | POA: Diagnosis not present

## 2019-03-29 DIAGNOSIS — R079 Chest pain, unspecified: Secondary | ICD-10-CM | POA: Diagnosis not present

## 2019-04-02 DIAGNOSIS — I10 Essential (primary) hypertension: Secondary | ICD-10-CM | POA: Diagnosis not present

## 2019-04-02 DIAGNOSIS — E785 Hyperlipidemia, unspecified: Secondary | ICD-10-CM | POA: Diagnosis not present

## 2019-04-02 DIAGNOSIS — E1165 Type 2 diabetes mellitus with hyperglycemia: Secondary | ICD-10-CM | POA: Diagnosis not present

## 2019-04-03 DIAGNOSIS — E1165 Type 2 diabetes mellitus with hyperglycemia: Secondary | ICD-10-CM | POA: Diagnosis not present

## 2019-04-03 DIAGNOSIS — I10 Essential (primary) hypertension: Secondary | ICD-10-CM | POA: Diagnosis not present

## 2019-04-03 DIAGNOSIS — E785 Hyperlipidemia, unspecified: Secondary | ICD-10-CM | POA: Diagnosis not present

## 2019-05-11 ENCOUNTER — Other Ambulatory Visit: Payer: Self-pay | Admitting: Internal Medicine
